# Patient Record
Sex: Female | Born: 1950 | Race: White | Hispanic: No | Marital: Married | Smoking: Never smoker
Health system: Southern US, Community
[De-identification: ages and names within clinical notes are randomized; demographics above are authoritative.]

## PROBLEM LIST (undated history)

## (undated) DIAGNOSIS — Z923 Personal history of irradiation: Secondary | ICD-10-CM

## (undated) DIAGNOSIS — K5792 Diverticulitis of intestine, part unspecified, without perforation or abscess without bleeding: Secondary | ICD-10-CM

## (undated) DIAGNOSIS — C50919 Malignant neoplasm of unspecified site of unspecified female breast: Secondary | ICD-10-CM

## (undated) DIAGNOSIS — K5790 Diverticulosis of intestine, part unspecified, without perforation or abscess without bleeding: Secondary | ICD-10-CM

## (undated) DIAGNOSIS — N841 Polyp of cervix uteri: Secondary | ICD-10-CM

## (undated) DIAGNOSIS — J189 Pneumonia, unspecified organism: Secondary | ICD-10-CM

## (undated) DIAGNOSIS — E559 Vitamin D deficiency, unspecified: Secondary | ICD-10-CM

## (undated) DIAGNOSIS — M1711 Unilateral primary osteoarthritis, right knee: Secondary | ICD-10-CM

## (undated) DIAGNOSIS — R112 Nausea with vomiting, unspecified: Secondary | ICD-10-CM

## (undated) DIAGNOSIS — M549 Dorsalgia, unspecified: Secondary | ICD-10-CM

## (undated) DIAGNOSIS — Z9889 Other specified postprocedural states: Secondary | ICD-10-CM

## (undated) DIAGNOSIS — Z8489 Family history of other specified conditions: Secondary | ICD-10-CM

## (undated) DIAGNOSIS — C801 Malignant (primary) neoplasm, unspecified: Secondary | ICD-10-CM

## (undated) DIAGNOSIS — I10 Essential (primary) hypertension: Secondary | ICD-10-CM

## (undated) HISTORY — PX: COLONOSCOPY: SHX174

## (undated) HISTORY — PX: DILATION AND CURETTAGE OF UTERUS: SHX78

## (undated) HISTORY — PX: BREAST BIOPSY: SHX20

## (undated) HISTORY — DX: Diverticulitis of intestine, part unspecified, without perforation or abscess without bleeding: K57.92

## (undated) HISTORY — DX: Polyp of cervix uteri: N84.1

## (undated) HISTORY — PX: SQUINT REPAIR: SHX5212

## (undated) HISTORY — PX: BREAST EXCISIONAL BIOPSY: SUR124

---
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
## 1989-05-25 HISTORY — PX: EXPLORATORY LAPAROTOMY: SUR591

## 2002-05-25 DIAGNOSIS — C50919 Malignant neoplasm of unspecified site of unspecified female breast: Secondary | ICD-10-CM

## 2002-05-25 HISTORY — DX: Malignant neoplasm of unspecified site of unspecified female breast: C50.919

## 2002-05-25 HISTORY — PX: BREAST LUMPECTOMY: SHX2

## 2004-03-05 ENCOUNTER — Ambulatory Visit: Payer: Self-pay | Admitting: Internal Medicine

## 2004-03-26 ENCOUNTER — Ambulatory Visit: Payer: Self-pay | Admitting: Oncology

## 2004-07-14 ENCOUNTER — Ambulatory Visit: Payer: Self-pay | Admitting: General Surgery

## 2004-09-24 ENCOUNTER — Ambulatory Visit: Payer: Self-pay | Admitting: Oncology

## 2004-10-01 ENCOUNTER — Ambulatory Visit: Payer: Self-pay | Admitting: General Surgery

## 2004-10-23 ENCOUNTER — Ambulatory Visit: Payer: Self-pay | Admitting: Oncology

## 2005-03-25 ENCOUNTER — Ambulatory Visit: Payer: Self-pay | Admitting: Oncology

## 2005-04-24 ENCOUNTER — Ambulatory Visit: Payer: Self-pay | Admitting: Oncology

## 2005-07-07 ENCOUNTER — Ambulatory Visit: Payer: Self-pay | Admitting: Internal Medicine

## 2005-10-05 ENCOUNTER — Ambulatory Visit: Payer: Self-pay | Admitting: Oncology

## 2005-10-09 ENCOUNTER — Ambulatory Visit: Payer: Self-pay | Admitting: Oncology

## 2005-10-23 ENCOUNTER — Ambulatory Visit: Payer: Self-pay | Admitting: Oncology

## 2006-04-07 ENCOUNTER — Ambulatory Visit: Payer: Self-pay | Admitting: Oncology

## 2006-04-24 ENCOUNTER — Ambulatory Visit: Payer: Self-pay | Admitting: Oncology

## 2006-09-23 ENCOUNTER — Ambulatory Visit: Payer: Self-pay | Admitting: Oncology

## 2006-10-07 ENCOUNTER — Ambulatory Visit: Payer: Self-pay | Admitting: Oncology

## 2006-10-12 ENCOUNTER — Ambulatory Visit: Payer: Self-pay | Admitting: Oncology

## 2006-10-24 ENCOUNTER — Ambulatory Visit: Payer: Self-pay | Admitting: Oncology

## 2007-01-03 ENCOUNTER — Ambulatory Visit: Payer: Self-pay | Admitting: Gastroenterology

## 2007-03-26 ENCOUNTER — Ambulatory Visit: Payer: Self-pay | Admitting: Oncology

## 2007-04-14 ENCOUNTER — Ambulatory Visit: Payer: Self-pay | Admitting: Oncology

## 2007-04-25 ENCOUNTER — Ambulatory Visit: Payer: Self-pay | Admitting: Oncology

## 2007-08-24 ENCOUNTER — Ambulatory Visit: Payer: Self-pay | Admitting: Oncology

## 2007-09-23 ENCOUNTER — Ambulatory Visit: Payer: Self-pay | Admitting: Oncology

## 2007-10-11 ENCOUNTER — Ambulatory Visit: Payer: Self-pay | Admitting: Oncology

## 2007-10-18 ENCOUNTER — Ambulatory Visit: Payer: Self-pay | Admitting: Oncology

## 2007-10-24 ENCOUNTER — Ambulatory Visit: Payer: Self-pay | Admitting: Oncology

## 2007-11-01 ENCOUNTER — Ambulatory Visit: Payer: Self-pay | Admitting: Oncology

## 2007-11-23 ENCOUNTER — Ambulatory Visit: Payer: Self-pay | Admitting: Oncology

## 2008-02-08 ENCOUNTER — Emergency Department: Payer: Self-pay | Admitting: Internal Medicine

## 2008-03-25 ENCOUNTER — Ambulatory Visit: Payer: Self-pay | Admitting: Oncology

## 2008-04-17 ENCOUNTER — Ambulatory Visit: Payer: Self-pay | Admitting: Oncology

## 2008-04-24 ENCOUNTER — Ambulatory Visit: Payer: Self-pay | Admitting: Oncology

## 2008-08-14 ENCOUNTER — Ambulatory Visit: Payer: Self-pay

## 2008-08-30 ENCOUNTER — Ambulatory Visit: Payer: Self-pay

## 2008-10-11 ENCOUNTER — Ambulatory Visit: Payer: Self-pay | Admitting: Oncology

## 2008-10-23 ENCOUNTER — Ambulatory Visit: Payer: Self-pay | Admitting: Oncology

## 2008-10-24 ENCOUNTER — Ambulatory Visit: Payer: Self-pay | Admitting: Oncology

## 2008-11-22 ENCOUNTER — Ambulatory Visit: Payer: Self-pay | Admitting: Oncology

## 2008-12-18 ENCOUNTER — Other Ambulatory Visit: Payer: Self-pay | Admitting: Internal Medicine

## 2009-04-03 ENCOUNTER — Other Ambulatory Visit: Payer: Self-pay | Admitting: Internal Medicine

## 2009-09-22 ENCOUNTER — Ambulatory Visit: Payer: Self-pay | Admitting: Oncology

## 2009-10-15 ENCOUNTER — Ambulatory Visit: Payer: Self-pay | Admitting: Oncology

## 2009-10-22 ENCOUNTER — Ambulatory Visit: Payer: Self-pay | Admitting: Oncology

## 2009-10-23 ENCOUNTER — Ambulatory Visit: Payer: Self-pay | Admitting: Oncology

## 2009-12-30 ENCOUNTER — Other Ambulatory Visit: Payer: Self-pay | Admitting: Internal Medicine

## 2010-04-04 ENCOUNTER — Ambulatory Visit: Payer: Self-pay | Admitting: Gastroenterology

## 2010-10-20 ENCOUNTER — Ambulatory Visit: Payer: Self-pay | Admitting: Internal Medicine

## 2011-01-01 ENCOUNTER — Other Ambulatory Visit: Payer: Self-pay | Admitting: Internal Medicine

## 2011-01-06 ENCOUNTER — Ambulatory Visit: Payer: Self-pay | Admitting: Internal Medicine

## 2011-08-25 ENCOUNTER — Ambulatory Visit: Payer: Self-pay | Admitting: Internal Medicine

## 2011-10-22 ENCOUNTER — Ambulatory Visit: Payer: Self-pay | Admitting: Internal Medicine

## 2011-12-31 ENCOUNTER — Other Ambulatory Visit: Payer: Self-pay | Admitting: Internal Medicine

## 2011-12-31 LAB — LIPID PANEL
Cholesterol: 207 mg/dL — ABNORMAL HIGH (ref 0–200)
HDL Cholesterol: 60 mg/dL (ref 40–60)
Ldl Cholesterol, Calc: 130 mg/dL — ABNORMAL HIGH (ref 0–100)
Triglycerides: 87 mg/dL (ref 0–200)
VLDL Cholesterol, Calc: 17 mg/dL (ref 5–40)

## 2011-12-31 LAB — COMPREHENSIVE METABOLIC PANEL
Anion Gap: 6 — ABNORMAL LOW (ref 7–16)
BUN: 20 mg/dL — ABNORMAL HIGH (ref 7–18)
Bilirubin,Total: 0.3 mg/dL (ref 0.2–1.0)
Chloride: 110 mmol/L — ABNORMAL HIGH (ref 98–107)
Co2: 28 mmol/L (ref 21–32)
EGFR (African American): >60
Osmolality: 289 (ref 275–301)
Potassium: 3.8 mmol/L (ref 3.5–5.1)
SGOT(AST): 37 U/L (ref 15–37)
Sodium: 144 mmol/L (ref 136–145)
Total Protein: 6.6 g/dL (ref 6.4–8.2)

## 2011-12-31 LAB — URINALYSIS, COMPLETE
Bacteria: NONE SEEN
Bilirubin,UR: NEGATIVE
Glucose,UR: NEGATIVE mg/dL (ref 0–75)
Ketone: NEGATIVE
RBC,UR: 3 /HPF (ref 0–5)
Specific Gravity: 1.023 (ref 1.003–1.030)
Squamous Epithelial: 3
Transitional Epi: <1
WBC UR: 17 /HPF (ref 0–5)

## 2011-12-31 LAB — CBC WITH DIFFERENTIAL/PLATELET
Basophil %: 1.3 %
Eosinophil %: 3.2 %
HCT: 38.2 % (ref 35.0–47.0)
HGB: 13 g/dL (ref 12.0–16.0)
Lymphocyte #: 1.1 10*3/uL (ref 1.0–3.6)
MCH: 30.7 pg (ref 26.0–34.0)
MCV: 91 fL (ref 80–100)
Monocyte #: 0.4 x10 3/mm (ref 0.2–0.9)
Monocyte %: 11 %
Neutrophil #: 1.9 10*3/uL (ref 1.4–6.5)
Platelet: 156 10*3/uL (ref 150–440)
RBC: 4.22 10*6/uL (ref 3.80–5.20)
WBC: 3.5 10*3/uL — ABNORMAL LOW (ref 3.6–11.0)

## 2011-12-31 LAB — SEDIMENTATION RATE: Erythrocyte Sed Rate: 5 mm/hr (ref 0–30)

## 2012-01-08 ENCOUNTER — Ambulatory Visit: Payer: Self-pay | Admitting: Internal Medicine

## 2012-01-19 ENCOUNTER — Encounter: Payer: Self-pay | Admitting: Physical Medicine and Rehabilitation

## 2012-01-24 ENCOUNTER — Encounter: Payer: Self-pay | Admitting: Physical Medicine and Rehabilitation

## 2012-02-23 ENCOUNTER — Encounter: Payer: Self-pay | Admitting: Physical Medicine and Rehabilitation

## 2012-05-16 ENCOUNTER — Other Ambulatory Visit: Payer: Self-pay | Admitting: Internal Medicine

## 2012-11-07 ENCOUNTER — Ambulatory Visit: Payer: Self-pay | Admitting: Internal Medicine

## 2012-11-08 ENCOUNTER — Ambulatory Visit: Payer: Self-pay | Admitting: Internal Medicine

## 2013-11-13 ENCOUNTER — Ambulatory Visit: Payer: Self-pay | Admitting: Internal Medicine

## 2013-11-23 ENCOUNTER — Ambulatory Visit: Payer: Self-pay | Admitting: Internal Medicine

## 2014-01-08 DIAGNOSIS — E559 Vitamin D deficiency, unspecified: Secondary | ICD-10-CM | POA: Insufficient documentation

## 2014-09-05 ENCOUNTER — Encounter: Payer: Self-pay | Admitting: *Deleted

## 2014-09-26 ENCOUNTER — Other Ambulatory Visit: Payer: Self-pay | Admitting: Internal Medicine

## 2014-09-26 DIAGNOSIS — Z1231 Encounter for screening mammogram for malignant neoplasm of breast: Secondary | ICD-10-CM

## 2014-11-19 ENCOUNTER — Other Ambulatory Visit: Payer: Self-pay | Admitting: Internal Medicine

## 2014-11-19 ENCOUNTER — Ambulatory Visit
Admission: RE | Admit: 2014-11-19 | Discharge: 2014-11-19 | Disposition: A | Discharge status: Home / Self Care | Payer: 59 | Source: Ambulatory Visit | Attending: Internal Medicine | Admitting: Internal Medicine

## 2014-11-19 DIAGNOSIS — Z1231 Encounter for screening mammogram for malignant neoplasm of breast: Secondary | ICD-10-CM | POA: Diagnosis present

## 2014-11-19 HISTORY — DX: Personal history of irradiation: Z92.3

## 2014-11-19 HISTORY — DX: Malignant (primary) neoplasm, unspecified: C80.1

## 2015-10-30 ENCOUNTER — Other Ambulatory Visit: Payer: Self-pay | Admitting: Internal Medicine

## 2015-10-30 DIAGNOSIS — Z1231 Encounter for screening mammogram for malignant neoplasm of breast: Secondary | ICD-10-CM

## 2015-11-20 ENCOUNTER — Ambulatory Visit
Admission: RE | Admit: 2015-11-20 | Discharge: 2015-11-20 | Disposition: A | Discharge status: Home / Self Care | Payer: BC Managed Care – PPO | Source: Ambulatory Visit | Attending: Internal Medicine | Admitting: Internal Medicine

## 2015-11-20 ENCOUNTER — Other Ambulatory Visit: Payer: Self-pay | Admitting: Internal Medicine

## 2015-11-20 DIAGNOSIS — Z1231 Encounter for screening mammogram for malignant neoplasm of breast: Secondary | ICD-10-CM

## 2016-01-26 ENCOUNTER — Encounter: Payer: Self-pay | Admitting: Emergency Medicine

## 2016-01-26 ENCOUNTER — Emergency Department: Payer: BC Managed Care – PPO

## 2016-01-26 ENCOUNTER — Emergency Department
Admission: EM | Admit: 2016-01-26 | Discharge: 2016-01-26 | Disposition: A | Discharge status: Home / Self Care | Payer: BC Managed Care – PPO | Attending: Emergency Medicine | Admitting: Emergency Medicine

## 2016-01-26 DIAGNOSIS — R0789 Other chest pain: Secondary | ICD-10-CM | POA: Insufficient documentation

## 2016-01-26 DIAGNOSIS — Z853 Personal history of malignant neoplasm of breast: Secondary | ICD-10-CM | POA: Diagnosis not present

## 2016-01-26 HISTORY — DX: Dorsalgia, unspecified: M54.9

## 2016-01-26 LAB — BASIC METABOLIC PANEL
Anion gap: 8 (ref 5–15)
BUN: 21 mg/dL — ABNORMAL HIGH (ref 6–20)
CO2: 25 mmol/L (ref 22–32)
CREATININE: 0.56 mg/dL (ref 0.44–1.00)
Calcium: 9.2 mg/dL (ref 8.9–10.3)
Chloride: 107 mmol/L (ref 101–111)
GFR calc non Af Amer: >60 mL/min (ref >60)
Glucose, Bld: 98 mg/dL (ref 65–99)
Potassium: 3.9 mmol/L (ref 3.5–5.1)
SODIUM: 140 mmol/L (ref 135–145)

## 2016-01-26 LAB — CBC
HCT: 41.7 % (ref 35.0–47.0)
Hemoglobin: 14.3 g/dL (ref 12.0–16.0)
MCH: 30.2 pg (ref 26.0–34.0)
MCHC: 34.2 g/dL (ref 32.0–36.0)
MCV: 88.5 fL (ref 80.0–100.0)
PLATELETS: 168 10*3/uL (ref 150–440)
RBC: 4.71 MIL/uL (ref 3.80–5.20)
RDW: 13.8 % (ref 11.5–14.5)
WBC: 9.7 10*3/uL (ref 3.6–11.0)

## 2016-01-26 LAB — BRAIN NATRIURETIC PEPTIDE: B Natriuretic Peptide: 24 pg/mL (ref 0.0–100.0)

## 2016-01-26 LAB — TROPONIN I

## 2016-01-26 LAB — FIBRIN DERIVATIVES D-DIMER (ARMC ONLY): Fibrin derivatives D-dimer (ARMC): 953 — ABNORMAL HIGH (ref 0–499)

## 2016-01-26 MED ORDER — MORPHINE SULFATE (PF) 2 MG/ML IV SOLN
2.0000 mg | Freq: Once | INTRAVENOUS | Status: AC
  Administered 2016-01-26: 2 mg via INTRAVENOUS
  Filled 2016-01-26: qty 1

## 2016-01-26 MED ORDER — ACETAMINOPHEN 500 MG PO TABS
1000.0000 mg | ORAL_TABLET | Freq: Once | ORAL | Status: AC
  Administered 2016-01-26: 1000 mg via ORAL
  Filled 2016-01-26: qty 2

## 2016-01-26 MED ORDER — OXYCODONE HCL 5 MG PO TABS
5.0000 mg | ORAL_TABLET | Freq: Once | ORAL | Status: AC
  Administered 2016-01-26: 5 mg via ORAL
  Filled 2016-01-26: qty 1

## 2016-01-26 MED ORDER — MORPHINE SULFATE (PF) 4 MG/ML IV SOLN
4.0000 mg | Freq: Once | INTRAVENOUS | Status: AC
  Administered 2016-01-26: 4 mg via INTRAVENOUS
  Filled 2016-01-26: qty 1

## 2016-01-26 MED ORDER — OXYCODONE HCL 5 MG PO TABS: 5.0000 mg | ORAL_TABLET | Freq: Three times a day (TID) | ORAL | 0 refills | Status: AC | PRN

## 2016-01-26 MED ORDER — LIDOCAINE 5 % EX PTCH: 1.0000 | MEDICATED_PATCH | Freq: Two times a day (BID) | CUTANEOUS | 0 refills | Status: AC

## 2016-01-26 MED ORDER — LIDOCAINE 5 % EX PTCH
1.0000 | MEDICATED_PATCH | CUTANEOUS | Status: DC
  Administered 2016-01-26: 1 via TRANSDERMAL
  Filled 2016-01-26: qty 1

## 2016-01-26 MED ORDER — IOPAMIDOL (ISOVUE-370) INJECTION 76%
75.0000 mL | Freq: Once | INTRAVENOUS | Status: AC | PRN
  Administered 2016-01-26: 75 mL via INTRAVENOUS

## 2016-01-26 MED ORDER — ONDANSETRON HCL 4 MG/2ML IJ SOLN
4.0000 mg | Freq: Once | INTRAMUSCULAR | Status: AC
  Administered 2016-01-26: 4 mg via INTRAVENOUS
  Filled 2016-01-26: qty 2

## 2016-01-26 NOTE — ED Triage Notes (Signed)
Pt states she woke up this morning around 0500 with right side rib pain. Pt states pain has not improved despite using muscle relaxant and heating pad.  Pt states it is painful to take in a deep breath.  Pt denies any injury or fall or any recent illness.

## 2016-01-26 NOTE — Discharge Instructions (Signed)
Pain control: tylenol 1000mg  every 8 hours, lidoderm patch every 24 hours, oxycodone 5 mg every 4-6 hours as needed for pain not well controlled.

## 2016-01-26 NOTE — ED Provider Notes (Signed)
[facility] Emergency Department Provider Note  ____________________________________________  Time seen: Approximately 9:44 AM  I have reviewed the triage vital signs and the nursing notes.   HISTORY  Chief Complaint Chest Pain   HPI Regina Blanchard is a 65 y.o. female history of remote breast cancer in 2004 status post lumpectomy and radiation and chronic back pain who presents for evaluation of right chest wall pain. Patient reports that she woke up this morning with severe pleuritic right chest wall pain. She denies trauma to the chest wall. She reports that he feels like a very bad muscle pull. She reports that applying pressure over the site of the pain makes it better. Pain is severe when she is taking deep breaths. She denies recent illness, cough, nausea, vomiting, abdominal pain. She does have a family history of DVTs on her mother the patient denies personal history of blood clots. No recent travel or immobilization, no hormones, no leg pain or swelling, no hemoptysis. Patient denies personal or family history of ischemic heart disease. She is not a smoker. She reports she took a muscle relaxant this morning and applied some heat over the area of her pain with no improvement. Of note, last mammogram was in 11/20/2015 and was negative.  Past Medical History:  Diagnosis Date  . Back pain   . Cancer ([place]) right   positive 2004  . Status post radiation therapy    2004 breast cancer    There are no active problems to display for this patient.   Past Surgical History:  Procedure Laterality Date  . BREAST BIOPSY Left    1991 negative 2006 negative  . BREAST EXCISIONAL BIOPSY Right    positive 2004     Prior to Admission medications   Medication Sig Start Date End Date Taking? Authorizing Provider  lidocaine (LIDODERM) 5 % Place 1 patch onto the skin every 12 (twelve) hours. Remove & Discard patch within 12 hours or as directed by MD 01/26/16 01/25/17   Rudene Re, MD  oxyCODONE (ROXICODONE) 5 MG immediate release tablet Take 1 tablet (5 mg total) by mouth every 8 (eight) hours as needed. 01/26/16 01/25/17  Rudene Re, MD    Allergies Review of patient's allergies indicates no known allergies.  Family History  Problem Relation Age of Onset  . Breast cancer Maternal Aunt 50    Social History Social History  Substance Use Topics  . Smoking status: Never Smoker  . Smokeless tobacco: Never Used  . Alcohol use Not on file    Review of Systems  Constitutional: Negative for fever. Eyes: Negative for visual changes. ENT: Negative for sore throat. Cardiovascular: + R chest wall pain Respiratory: Negative for shortness of breath. Gastrointestinal: Negative for abdominal pain, vomiting or diarrhea. Genitourinary: Negative for dysuria. Musculoskeletal: Negative for back pain. Skin: Negative for rash. Neurological: Negative for headaches, weakness or numbness.  ____________________________________________   PHYSICAL EXAM:  VITAL SIGNS: ED Triage Vitals [01/26/16 0928]  Enc Vitals Group     BP 134/83     Pulse Rate (!) 110     Resp 18     Temp 97.8 F (36.6 C)     Temp Source Oral     SpO2 96 %     Weight 209 lb (94.8 kg)     Height 5\' 9"  (1.753 m)     Head Circumference      Peak Flow      Pain Score 9  Pain Loc      Pain Edu?      Excl. in [place]?     Constitutional: Alert and oriented, splinting and in obvious distress due to pain.  HEENT:      Head: Normocephalic and atraumatic.         Eyes: Conjunctivae are normal. Sclera is non-icteric. EOMI. PERRL      Mouth/Throat: Mucous membranes are moist.       Neck: Supple with no signs of meningismus. Cardiovascular: Tachycardic with regular rhythm. No murmurs, gallops, or rubs. 2+ symmetrical distal pulses are present in all extremities. No JVD. Respiratory: Normal respiratory effort. Lungs are clear to auscultation bilaterally. No wheezes, crackles, or  rhonchi.  Gastrointestinal: Soft, non tender, and non distended with positive bowel sounds. No rebound or guarding. Genitourinary: No CVA tenderness. Musculoskeletal: Nontender with normal range of motion in all extremities. No edema, cyanosis, or erythema of extremities. Neurologic: Normal speech and language. Face is symmetric. Moving all extremities. No gross focal neurologic deficits are appreciated. Skin: Skin is warm, dry and intact. Erythematous macules over the R breast and chest wall Psychiatric: Mood and affect are normal. Speech and behavior are normal.  ____________________________________________   LABS (all labs ordered are listed, but only abnormal results are displayed)  Labs Reviewed  BASIC METABOLIC PANEL - Abnormal; Notable for the following:       Result Value   BUN 21 (*)    All other components within normal limits  FIBRIN DERIVATIVES D-DIMER ([facility] ONLY) - Abnormal; Notable for the following:    Fibrin derivatives D-dimer ([facility]) 953 (*)    All other components within normal limits  TROPONIN I  CBC  BRAIN NATRIURETIC PEPTIDE   ____________________________________________  EKG  ED ECG REPORT I, Rudene Re, the attending physician, personally viewed and interpreted this ECG.  Sinus tachycardia, rate of 103, normal intervals, normal axis, no ST elevations or depression,  T-wave inversion on V3. No prior for comparison  ____________________________________________  RADIOLOGY  CTA: negativ ____________________________________________   PROCEDURES  Procedure(s) performed: None Procedures Critical Care performed:  None ____________________________________________   INITIAL IMPRESSION / ASSESSMENT AND PLAN / ED COURSE  65 y.o. female history of remote breast cancer in 2004 status post lumpectomy and radiation and chronic back pain who presents for evaluation of acute sudden onset of pleuritic right chest wall pain since this morning. No trauma.  Patient is a splinting and in obvious distress, her vital signs show tachycardia with heart rate of 110 but otherwise within normal limits, lungs are clear to auscultation, pain is nonreproducible on palpation. Patient does have a macular erythematous rash underlying her right breast and chest wall which she attributes to the heat pad that she had on that side. No vesicles or lesions concerning for shingles. Patient's last mammogram was 3 months ago for recurrent disease. Differential diagnoses including pleurisy versus pneumonia versus PE versus MSK. EKG with no evidence of ischemia. We'll treat her pain with IV morphine. We'll check CBC, BMP, d-dimer, troponin. We'll get a chest x-ray.  Clinical Course  Comment By Time  D-dimer elevated. CTA PE ordered. Patient and family updated. Troponin and BMP WNL. Patient continues to have significant pain. CXR showing small pleural effusion on the R side.  Rudene Re, MD 09/03 1130  CT negative for PE or PNA or fracture. Pain very positional. Rash resolved. Probably MSK. Patient reports pain 6/10, worse with inspiration. Patient splinting. Will switch to PO meds with tylenol 1000mg , oxycodone 5mg , lidoderm  patch. Will teach patient how to use incentive spirometer. Rudene Re, MD 09/03 1322    Pertinent labs & imaging results that were available during my care of the patient were reviewed by me and considered in my medical decision making (see chart for details).    ____________________________________________   FINAL CLINICAL IMPRESSION(S) / ED DIAGNOSES  Final diagnoses:  Chest wall pain      NEW MEDICATIONS STARTED DURING THIS VISIT:  New Prescriptions   LIDOCAINE (LIDODERM) 5 %    Place 1 patch onto the skin every 12 (twelve) hours. Remove & Discard patch within 12 hours or as directed by MD   OXYCODONE (ROXICODONE) 5 MG IMMEDIATE RELEASE TABLET    Take 1 tablet (5 mg total) by mouth every 8 (eight) hours as needed.     Note:   This document was prepared using Dragon voice recognition software and may include unintentional dictation errors.    Rudene Re, MD 01/26/16 [phone]

## 2016-10-23 ENCOUNTER — Other Ambulatory Visit: Payer: Self-pay | Admitting: Internal Medicine

## 2016-10-23 DIAGNOSIS — Z1231 Encounter for screening mammogram for malignant neoplasm of breast: Secondary | ICD-10-CM

## 2016-12-01 ENCOUNTER — Ambulatory Visit
Admission: RE | Admit: 2016-12-01 | Discharge: 2016-12-01 | Disposition: A | Discharge status: Home / Self Care | Payer: BC Managed Care – PPO | Source: Ambulatory Visit | Attending: Internal Medicine | Admitting: Internal Medicine

## 2016-12-01 DIAGNOSIS — Z1231 Encounter for screening mammogram for malignant neoplasm of breast: Secondary | ICD-10-CM | POA: Insufficient documentation

## 2016-12-01 HISTORY — DX: Malignant neoplasm of unspecified site of unspecified female breast: C50.919

## 2016-12-03 ENCOUNTER — Other Ambulatory Visit: Payer: Self-pay | Admitting: Internal Medicine

## 2016-12-03 DIAGNOSIS — N6320 Unspecified lump in the left breast, unspecified quadrant: Secondary | ICD-10-CM

## 2016-12-03 DIAGNOSIS — R928 Other abnormal and inconclusive findings on diagnostic imaging of breast: Secondary | ICD-10-CM

## 2016-12-11 ENCOUNTER — Ambulatory Visit
Admission: RE | Admit: 2016-12-11 | Discharge: 2016-12-11 | Disposition: A | Discharge status: Home / Self Care | Payer: BC Managed Care – PPO | Source: Ambulatory Visit | Attending: Internal Medicine | Admitting: Internal Medicine

## 2016-12-11 DIAGNOSIS — N6320 Unspecified lump in the left breast, unspecified quadrant: Secondary | ICD-10-CM

## 2016-12-11 DIAGNOSIS — R928 Other abnormal and inconclusive findings on diagnostic imaging of breast: Secondary | ICD-10-CM

## 2017-01-27 ENCOUNTER — Ambulatory Visit (INDEPENDENT_AMBULATORY_CARE_PROVIDER_SITE_OTHER): Payer: BC Managed Care – PPO | Admitting: Advanced Practice Midwife

## 2017-01-27 ENCOUNTER — Encounter: Payer: Self-pay | Admitting: Advanced Practice Midwife

## 2017-01-27 VITALS — BP 138/82 | HR 78 | Ht 68.5 in | Wt 217.0 lb

## 2017-01-27 DIAGNOSIS — N6019 Diffuse cystic mastopathy of unspecified breast: Secondary | ICD-10-CM | POA: Insufficient documentation

## 2017-01-27 DIAGNOSIS — Z87440 Personal history of urinary (tract) infections: Secondary | ICD-10-CM

## 2017-01-27 DIAGNOSIS — Z124 Encounter for screening for malignant neoplasm of cervix: Secondary | ICD-10-CM | POA: Diagnosis not present

## 2017-01-27 DIAGNOSIS — Z01419 Encounter for gynecological examination (general) (routine) without abnormal findings: Secondary | ICD-10-CM | POA: Diagnosis not present

## 2017-01-27 DIAGNOSIS — D0591 Unspecified type of carcinoma in situ of right breast: Secondary | ICD-10-CM | POA: Insufficient documentation

## 2017-01-27 MED ORDER — NITROFURANTOIN MONOHYD MACRO 100 MG PO CAPS: ORAL_CAPSULE | ORAL | 6 refills | Status: DC

## 2017-01-28 NOTE — Progress Notes (Signed)
Patient ID: Regina Blanchard, female   DOB: April 28, 1951, 66 y.o.   MRN: [id number]     Gynecology Annual Exam  PCP: Rusty Aus, MD  Chief Complaint:  Chief Complaint  Patient presents with  . Gynecologic Exam    History of Present Illness:Patient is a 66 y.o. G0P0000 presents for annual exam. The patient has no complaints today. She does request a refill of Macrobid which she has had in the past for recurrent UTI following intercourse. She denies current s/s.   LMP: No LMP recorded. Patient is postmenopausal. Postcoital Bleeding: no Dysmenorrhea: not applicable   The patient is sexually active. She denies dyspareunia.  The patient does perform self breast exams.  There is no notable family history of breast or ovarian cancer in her family.  The patient wears seatbelts: yes.   The patient has regular exercise: yes.  She does water aerobics. She admits healthy diet and adequate hydration.  The patient denies current symptoms of depression.     Review of Systems: Review of Systems  Constitutional: Negative.   HENT: Negative.   Eyes: Negative.   Respiratory: Negative.   Cardiovascular: Negative.   Gastrointestinal: Negative.   Genitourinary: Negative.   Musculoskeletal: Negative.   Skin: Negative.   Neurological: Negative.   Endo/Heme/Allergies: Negative.   Psychiatric/Behavioral: Negative.     Past Medical History:  Past Medical History:  Diagnosis Date  . Back pain   . Breast cancer ([place])    right breast cancer  . Cancer ([place]) right   positive 2004  . Status post radiation therapy    2004 breast cancer    Past Surgical History:  Past Surgical History:  Procedure Laterality Date  . BREAST BIOPSY Left    1991 negative 2006 negative  . BREAST EXCISIONAL BIOPSY Right    positive 2004   . BREAST LUMPECTOMY      Gynecologic History:  No LMP recorded. Patient is postmenopausal. Last Pap: 2 years ago Results were:  no abnormalities  Last mammogram: in July  of 2018 Results were: BI-RAD II benign cysts Obstetric History: G0P0000  Family History:  Family History  Problem Relation Age of Onset  . Breast cancer Maternal Aunt 50    Social History:  Social History   Social History  . Marital status: Married    Spouse name: N/A  . Number of children: N/A  . Years of education: N/A   Occupational History  . Not on file.   Social History Main Topics  . Smoking status: Never Smoker  . Smokeless tobacco: Never Used  . Alcohol use No  . Drug use: No  . Sexual activity: Yes    Birth control/ protection: Post-menopausal   Other Topics Concern  . Not on file   Social History Narrative  . No narrative on file    Allergies:  No Known Allergies  Medications: Prior to Admission medications   Medication Sig Start Date End Date Taking? Authorizing Provider  ALPRAZolam (XANAX) 0.25 MG tablet TAKE 1 TABLET BY MOUTH DAILY AS NEEDED FOR SLEEP 04/03/16  Yes [provider]  aspirin EC 81 MG tablet Take by mouth.   Yes [provider]  baclofen (LIORESAL) 10 MG tablet Take by mouth. 01/20/17  Yes [provider]  Cholecalciferol (VITAMIN D3) 2000 units capsule Take by mouth.   Yes [provider]  hydrochlorothiazide (HYDRODIURIL) 25 MG tablet Take by mouth. 01/20/17  Yes [provider]  methocarbamol (ROBAXIN) 750 MG tablet  Take by mouth. 01/20/17  Yes [provider]  nitrofurantoin, macrocrystal-monohydrate, (MACROBID) 100 MG capsule Take one capsule after intercourse to prevent UTI 01/27/17   Rod Can, CNM    Physical Exam Vitals: Blood pressure 138/82, pulse 78, height 5' 8.5" (1.74 m), weight 217 lb (98.4 kg).  General: NAD HEENT: normocephalic, anicteric Thyroid: no enlargement, no palpable nodules Pulmonary: No increased work of breathing, CTAB Cardiovascular: RRR, distal pulses 2+ Breast: Breast symmetrical, no tenderness, no palpable nodules or masses, no skin or nipple  retraction present, no nipple discharge.  No axillary or supraclavicular lymphadenopathy. Abdomen: NABS, soft, non-tender, non-distended.  Umbilicus without lesions.  No hepatomegaly, splenomegaly or masses palpable. No evidence of hernia  Genitourinary:  External: Normal external female genitalia.  Normal urethral meatus, normal  Bartholin's and Skene's glands.    Vagina: Normal vaginal mucosa, no evidence of prolapse.    Cervix: Grossly normal in appearance, no bleeding, no CMT  Uterus: Non-enlarged, mobile, normal contour.    Adnexa: ovaries non-enlarged, no adnexal masses  Rectal: deferred  Lymphatic: no evidence of inguinal lymphadenopathy Extremities: no edema, erythema, or tenderness Neurologic: Grossly intact Psychiatric: mood appropriate, affect full  Female chaperone present for pelvic and breast  portions of the physical exam     Assessment: 66 y.o. G0P0000 routine annual exam  Plan: Problem List Items Addressed This Visit    None    Visit Diagnoses    Well woman exam with routine gynecological exam    -  Primary   Relevant Orders   IGP, Aptima HPV   Cervical cancer screening       Relevant Orders   IGP, Aptima HPV   History of recurrent UTIs       Relevant Medications   nitrofurantoin, macrocrystal-monohydrate, (MACROBID) 100 MG capsule      1) Mammogram - recommend yearly screening mammogram.  Mammogram Is up to date  2) STI screening was offered and declined  3) ASCCP guidelines and rational discussed.  Patient opts for every 3 years screening interval  4) Osteoporosis  - per USPTF routine screening DEXA at age 64 Patient has normal findings except for osteopenia in spine at -1.8. She is due for screening  Consider FDA-approved medical therapies in postmenopausal women and men aged 64 years and older, based on the following: a) A hip or vertebral (clinical or morphometric) fracture b) T-score ? -2.5 at the femoral neck or spine after appropriate  evaluation to exclude secondary causes C) Low bone mass (T-score between -1.0 and -2.5 at the femoral neck or spine) and a 10-year probability of a hip fracture ? 3% or a 10-year probability of a major osteoporosis-related fracture ? 20% based on the [place]-adapted WHO algorithm   5) Routine healthcare maintenance including cholesterol, diabetes screening discussed managed by PCP  6) Colonoscopy: PCP to order.  Screening recommended starting at age 59 for average risk individuals, age 62 for individuals deemed at increased risk (including African Americans) and recommended to continue until age 17.  For patient age 7-85 individualized approach is recommended.  Gold standard screening is via colonoscopy, Cologuard screening is an acceptable alternative for patient unwilling or unable to undergo colonoscopy.  "Colorectal cancer screening for average?risk adults: 2018 guideline update from the American Cancer Society"[place]: A Cancer Journal for Clinicians: Oct 21, 2016   7) Follow up 1 year for routine annual

## 2017-01-30 LAB — IGP, APTIMA HPV
HPV APTIMA: NEGATIVE
PAP SMEAR COMMENT: 0

## 2017-11-17 ENCOUNTER — Other Ambulatory Visit: Payer: Self-pay | Admitting: Internal Medicine

## 2017-11-17 DIAGNOSIS — Z1231 Encounter for screening mammogram for malignant neoplasm of breast: Secondary | ICD-10-CM

## 2017-12-14 ENCOUNTER — Ambulatory Visit
Admission: RE | Admit: 2017-12-14 | Discharge: 2017-12-14 | Disposition: A | Discharge status: Home / Self Care | Payer: BC Managed Care – PPO | Source: Ambulatory Visit | Attending: Internal Medicine | Admitting: Internal Medicine

## 2017-12-14 DIAGNOSIS — Z1231 Encounter for screening mammogram for malignant neoplasm of breast: Secondary | ICD-10-CM | POA: Insufficient documentation

## 2018-01-21 DIAGNOSIS — E782 Mixed hyperlipidemia: Secondary | ICD-10-CM | POA: Insufficient documentation

## 2018-04-10 DIAGNOSIS — M1711 Unilateral primary osteoarthritis, right knee: Secondary | ICD-10-CM | POA: Insufficient documentation

## 2018-04-27 ENCOUNTER — Ambulatory Visit (INDEPENDENT_AMBULATORY_CARE_PROVIDER_SITE_OTHER): Payer: Medicare Other | Admitting: Advanced Practice Midwife

## 2018-04-27 ENCOUNTER — Encounter: Payer: Self-pay | Admitting: Advanced Practice Midwife

## 2018-04-27 VITALS — BP 132/82 | Ht 68.0 in | Wt 216.0 lb

## 2018-04-27 DIAGNOSIS — Z01419 Encounter for gynecological examination (general) (routine) without abnormal findings: Secondary | ICD-10-CM | POA: Diagnosis not present

## 2018-04-27 DIAGNOSIS — Z13820 Encounter for screening for osteoporosis: Secondary | ICD-10-CM

## 2018-04-27 DIAGNOSIS — Z0000 Encounter for general adult medical examination without abnormal findings: Secondary | ICD-10-CM

## 2018-04-27 DIAGNOSIS — Z87440 Personal history of urinary (tract) infections: Secondary | ICD-10-CM

## 2018-04-27 MED ORDER — NITROFURANTOIN MONOHYD MACRO 100 MG PO CAPS: ORAL_CAPSULE | ORAL | 6 refills | Status: DC

## 2018-04-27 NOTE — Patient Instructions (Signed)
Mediterranean Diet A Mediterranean diet refers to food and lifestyle choices that are based on the traditions of countries located on the [place]. This way of eating has been shown to help prevent certain conditions and improve outcomes for people who have chronic diseases, like kidney disease and heart disease. What are tips for following this plan? Lifestyle  Cook and eat meals together with your family, when possible.  Drink enough fluid to keep your urine clear or pale yellow.  Be physically active every day. This includes: ? Aerobic exercise like running or swimming. ? Leisure activities like gardening, walking, or housework.  Get 7-8 hours of sleep each night.  If recommended by your health care provider, drink red wine in moderation. This means 1 glass a day for nonpregnant women and 2 glasses a day for men. A glass of wine equals 5 oz (150 mL). Reading food labels  Check the serving size of packaged foods. For foods such as rice and pasta, the serving size refers to the amount of cooked product, not dry.  Check the total fat in packaged foods. Avoid foods that have saturated fat or trans fats.  Check the ingredients list for added sugars, such as corn syrup. Shopping  At the grocery store, buy most of your food from the areas near the walls of the store. This includes: ? Fresh fruits and vegetables (produce). ? Grains, beans, nuts, and seeds. Some of these may be available in unpackaged forms or large amounts (in bulk). ? Fresh seafood. ? Poultry and eggs. ? Low-fat dairy products.  Buy whole ingredients instead of prepackaged foods.  Buy fresh fruits and vegetables in-season from local farmers markets.  Buy frozen fruits and vegetables in resealable bags.  If you do not have access to quality fresh seafood, buy precooked frozen shrimp or canned fish, such as tuna, salmon, or sardines.  Buy small amounts of raw or cooked vegetables, salads, or olives from the  deli or salad bar at your store.  Stock your pantry so you always have certain foods on hand, such as olive oil, canned tuna, canned tomatoes, rice, pasta, and beans. Cooking  Cook foods with extra-virgin olive oil instead of using butter or other vegetable oils.  Have meat as a side dish, and have vegetables or grains as your main dish. This means having meat in small portions or adding small amounts of meat to foods like pasta or stew.  Use beans or vegetables instead of meat in common dishes like chili or lasagna.  Experiment with different cooking methods. Try roasting or broiling vegetables instead of steaming or sauteing them.  Add frozen vegetables to soups, stews, pasta, or rice.  Add nuts or seeds for added healthy fat at each meal. You can add these to yogurt, salads, or vegetable dishes.  Marinate fish or vegetables using olive oil, lemon juice, garlic, and fresh herbs. Meal planning  Plan to eat 1 vegetarian meal one day each week. Try to work up to 2 vegetarian meals, if possible.  Eat seafood 2 or more times a week.  Have healthy snacks readily available, such as: ? Vegetable sticks with hummus. ? Greek yogurt. ? Fruit and nut trail mix.  Eat balanced meals throughout the week. This includes: ? Fruit: 2-3 servings a day ? Vegetables: 4-5 servings a day ? Low-fat dairy: 2 servings a day ? Fish, poultry, or lean meat: 1 serving a day ? Beans and legumes: 2 or more servings a week ? Nuts   and seeds: 1-2 servings a day ? Whole grains: 6-8 servings a day ? Extra-virgin olive oil: 3-4 servings a day  Limit red meat and sweets to only a few servings a month What are my food choices?  Mediterranean diet ? Recommended ? Grains: Whole-grain pasta. Brown rice. Bulgar wheat. Polenta. Couscous. Whole-wheat bread. Oatmeal. Quinoa. ? Vegetables: Artichokes. Beets. Broccoli. Cabbage. Carrots. Eggplant. Green beans. Chard. Kale. Spinach. Onions. Leeks. Peas. Squash.  Tomatoes. Peppers. Radishes. ? Fruits: Apples. Apricots. Avocado. Berries. Bananas. Cherries. Dates. Figs. Grapes. Lemons. Melon. Oranges. Peaches. Plums. Pomegranate. ? Meats and other protein foods: Beans. Almonds. Sunflower seeds. Pine nuts. Peanuts. Cod. Salmon. Scallops. Shrimp. Tuna. Tilapia. Clams. Oysters. Eggs. ? Dairy: Low-fat milk. Cheese. Greek yogurt. ? Beverages: Water. Red wine. Herbal tea. ? Fats and oils: Extra virgin olive oil. Avocado oil. Grape seed oil. ? Sweets and desserts: Greek yogurt with honey. Baked apples. Poached pears. Trail mix. ? Seasoning and other foods: Basil. Cilantro. Coriander. Cumin. Mint. Parsley. Sage. Rosemary. Tarragon. Garlic. Oregano. Thyme. Pepper. Balsalmic vinegar. Tahini. Hummus. Tomato sauce. Olives. Mushrooms. ? Limit these ? Grains: Prepackaged pasta or rice dishes. Prepackaged cereal with added sugar. ? Vegetables: Deep fried potatoes (french fries). ? Fruits: Fruit canned in syrup. ? Meats and other protein foods: Beef. Pork. Lamb. Poultry with skin. Hot dogs. Bacon. ? Dairy: Ice cream. Sour cream. Whole milk. ? Beverages: Juice. Sugar-sweetened soft drinks. Beer. Liquor and spirits. ? Fats and oils: Butter. Canola oil. Vegetable oil. Beef fat (tallow). Lard. ? Sweets and desserts: Cookies. Cakes. Pies. Candy. ? Seasoning and other foods: Mayonnaise. Premade sauces and marinades. ? The items listed may not be a complete list. Talk with your dietitian about what dietary choices are right for you. Summary  The Mediterranean diet includes both food and lifestyle choices.  Eat a variety of fresh fruits and vegetables, beans, nuts, seeds, and whole grains.  Limit the amount of red meat and sweets that you eat.  Talk with your health care provider about whether it is safe for you to drink red wine in moderation. This means 1 glass a day for nonpregnant women and 2 glasses a day for men. A glass of wine equals 5 oz (150 mL). This information  is not intended to replace advice given to you by your health care provider. Make sure you discuss any questions you have with your health care provider. Document Released: 01/02/2016 Document Revised: 02/04/2016 Document Reviewed: 01/02/2016 Elsevier Interactive Patient Education  2018 Elsevier Inc. American Heart Association (AHA) Exercise Recommendation  Being physically active is important to prevent heart disease and stroke, the nation's No. 1and No. 5killers. To improve overall cardiovascular health, we suggest at least 150 minutes per week of moderate exercise or 75 minutes per week of vigorous exercise (or a combination of moderate and vigorous activity). Thirty minutes a day, five times a week is an easy goal to remember. You will also experience benefits even if you divide your time into two or three segments of 10 to 15 minutes per day.  For people who would benefit from lowering their blood pressure or cholesterol, we recommend 40 minutes of aerobic exercise of moderate to vigorous intensity three to four times a week to lower the risk for heart attack and stroke.  Physical activity is anything that makes you move your body and burn calories.  This includes things like climbing stairs or playing sports. Aerobic exercises benefit your heart, and include walking, jogging, swimming or biking. Strength and   stretching exercises are best for overall stamina and flexibility.  The simplest, positive change you can make to effectively improve your heart health is to start walking. It's enjoyable, free, easy, social and great exercise. A walking program is flexible and boasts high success rates because people can stick with it. It's easy for walking to become a regular and satisfying part of life.   For Overall Cardiovascular Health:  At least 30 minutes of moderate-intensity aerobic activity at least 5 days per week for a total of 150  OR   At least 25 minutes of vigorous aerobic activity  at least 3 days per week for a total of 75 minutes; or a combination of moderate- and vigorous-intensity aerobic activity  AND   Moderate- to high-intensity muscle-strengthening activity at least 2 days per week for additional health benefits.  For Lowering Blood Pressure and Cholesterol  An average 40 minutes of moderate- to vigorous-intensity aerobic activity 3 or 4 times per week  What if I can't make it to the time goal? Something is always better than nothing! And everyone has to start somewhere. Even if you've been sedentary for years, today is the day you can begin to make healthy changes in your life. If you don't think you'll make it for 30 or 40 minutes, set a reachable goal for today. You can work up toward your overall goal by increasing your time as you get stronger. Don't let all-or-nothing thinking rob you of doing what you can every day.  Source:http://www.heart.org   Preventive Care 40-64 Years, Female Preventive care refers to lifestyle choices and visits with your health care provider that can promote health and wellness. What does preventive care include?  A yearly physical exam. This is also called an annual well check.  Dental exams once or twice a year.  Routine eye exams. Ask your health care provider how often you should have your eyes checked.  Personal lifestyle choices, including: ? Daily care of your teeth and gums. ? Regular physical activity. ? Eating a healthy diet. ? Avoiding tobacco and drug use. ? Limiting alcohol use. ? Practicing safe sex. ? Taking low-dose aspirin daily starting at age 34. ? Taking vitamin and mineral supplements as recommended by your health care provider. What happens during an annual well check? The services and screenings done by your health care provider during your annual well check will depend on your age, overall health, lifestyle risk factors, and family history of disease. Counseling Your health care provider may  ask you questions about your:  Alcohol use.  Tobacco use.  Drug use.  Emotional well-being.  Home and relationship well-being.  Sexual activity.  Eating habits.  Work and work Statistician.  Method of birth control.  Menstrual cycle.  Pregnancy history.  Screening You may have the following tests or measurements:  Height, weight, and BMI.  Blood pressure.  Lipid and cholesterol levels. These may be checked every 5 years, or more frequently if you are over 23 years old.  Skin check.  Lung cancer screening. You may have this screening every year starting at age 59 if you have a 30-pack-year history of smoking and currently smoke or have quit within the past 15 years.  Fecal occult blood test (FOBT) of the stool. You may have this test every year starting at age 66.  Flexible sigmoidoscopy or colonoscopy. You may have a sigmoidoscopy every 5 years or a colonoscopy every 10 years starting at age 66.  Hepatitis C blood test.  Hepatitis B blood test.  Sexually transmitted disease (STD) testing.  Diabetes screening. This is done by checking your blood sugar (glucose) after you have not eaten for a while (fasting). You may have this done every 1-3 years.  Mammogram. This may be done every 1-2 years. Talk to your health care provider about when you should start having regular mammograms. This may depend on whether you have a family history of breast cancer.  BRCA-related cancer screening. This may be done if you have a family history of breast, ovarian, tubal, or peritoneal cancers.  Pelvic exam and Pap test. This may be done every 3 years starting at age 58. Starting at age 75, this may be done every 5 years if you have a Pap test in combination with an HPV test.  Bone density scan. This is done to screen for osteoporosis. You may have this scan if you are at high risk for osteoporosis.  Discuss your test results, treatment options, and if necessary, the need for more  tests with your health care provider. Vaccines Your health care provider may recommend certain vaccines, such as:  Influenza vaccine. This is recommended every year.  Tetanus, diphtheria, and acellular pertussis (Tdap, Td) vaccine. You may need a Td booster every 10 years.  Varicella vaccine. You may need this if you have not been vaccinated.  Zoster vaccine. You may need this after age 57.  Measles, mumps, and rubella (MMR) vaccine. You may need at least one dose of MMR if you were born in 1957 or later. You may also need a second dose.  Pneumococcal 13-valent conjugate (PCV13) vaccine. You may need this if you have certain conditions and were not previously vaccinated.  Pneumococcal polysaccharide (PPSV23) vaccine. You may need one or two doses if you smoke cigarettes or if you have certain conditions.  Meningococcal vaccine. You may need this if you have certain conditions.  Hepatitis A vaccine. You may need this if you have certain conditions or if you travel or work in places where you may be exposed to hepatitis A.  Hepatitis B vaccine. You may need this if you have certain conditions or if you travel or work in places where you may be exposed to hepatitis B.  Haemophilus influenzae type b (Hib) vaccine. You may need this if you have certain conditions.  Talk to your health care provider about which screenings and vaccines you need and how often you need them. This information is not intended to replace advice given to you by your health care provider. Make sure you discuss any questions you have with your health care provider. Document Released: 06/07/2015 Document Revised: 01/29/2016 Document Reviewed: 03/12/2015 Elsevier Interactive Patient Education  [employer].

## 2018-04-27 NOTE — Progress Notes (Signed)
Patient ID: Regina Blanchard, female   DOB: [birth year], 67 y.o.   MRN: [id number]      Gynecology Annual Exam  PCP: Rusty Aus, MD  Chief Complaint:  Chief Complaint  Patient presents with  . Gynecologic Exam    History of Present Illness:Patient is a 67 y.o. G0P0000 presents for annual exam. The patient has no gyn complaints today. She was taking HCTZ PRN for some swelling in her leg but is now taking it daily for her blood pressure prior to having a knee replacement in February next year. She has a new diagnosis of osteoarthritis in her right knee. She has had some ongoing difficulties with urine leakage but especially since taking the HCTZ regularly. She admits adequate hydration and she occasionally does Kegel exercises.   LMP: No LMP recorded. Patient is postmenopausal.  Postcoital Bleeding: no Dysmenorrhea: not applicable  The patient is sexually active. She denies dyspareunia.  The patient does perform self breast exams.  There is no notable family history of breast or ovarian cancer in her family. She was diagnosed and treated for breast cancer at age 37.  The patient wears seatbelts: yes.   The patient has regular exercise: she does water aerobics 2 x per week. She admits to generally healthy diet with room for improvement. .    The patient denies current symptoms of depression.     Review of Systems: Review of Systems  Constitutional: Negative.   HENT: Negative.   Eyes: Negative.   Respiratory: Negative.   Cardiovascular: Negative.   Gastrointestinal: Negative.   Genitourinary: Positive for urgency.  Musculoskeletal: Positive for joint pain.  Skin: Negative.   Neurological: Negative.   Endo/Heme/Allergies: Negative.   Psychiatric/Behavioral: Negative.     Past Medical History:  Past Medical History:  Diagnosis Date  . Back pain   . Breast cancer ([place])    right breast cancer  . Cancer ([place]) right   positive 2004  . Cervical polyp   . Diverticulitis   .  Status post radiation therapy    2004 breast cancer    Past Surgical History:  Past Surgical History:  Procedure Laterality Date  . BREAST BIOPSY Left    1991 negative 2006 negative  . BREAST EXCISIONAL BIOPSY Right    positive 2004   . BREAST LUMPECTOMY    . DILATION AND CURETTAGE OF UTERUS      Gynecologic History:  No LMP recorded. Patient is postmenopausal. Last Pap: 1 year ago Results were:  no abnormalities  Last mammogram: 5 months ago Results were: BI-RAD I  Obstetric History: G0P0000  Family History:  Family History  Problem Relation Age of Onset  . Breast cancer Maternal Aunt 66  . Diabetes Mother   . Heart disease Father   . Diabetes Sister   . Breast cancer Paternal Aunt   . Colon cancer Maternal Aunt   . Hypertension Maternal Aunt     Social History:  Social History   Socioeconomic History  . Marital status: Married    Spouse name: Not on file  . Number of children: Not on file  . Years of education: Not on file  . Highest education level: Not on file  Occupational History  . Not on file  Social Needs  . Financial resource strain: Not on file  . Food insecurity:    Worry: Not on file    Inability: Not on file  . Transportation needs:    Medical: Not on file  Non-medical: Not on file  Tobacco Use  . Smoking status: Never Smoker  . Smokeless tobacco: Never Used  Substance and Sexual Activity  . Alcohol use: No  . Drug use: No  . Sexual activity: Yes    Birth control/protection: Post-menopausal  Lifestyle  . Physical activity:    Days per week: Not on file    Minutes per session: Not on file  . Stress: Not on file  Relationships  . Social connections:    Talks on phone: Not on file    Gets together: Not on file    Attends religious service: Not on file    Active member of club or organization: Not on file    Attends meetings of clubs or organizations: Not on file    Relationship status: Not on file  . Intimate partner violence:     Fear of current or ex partner: Not on file    Emotionally abused: Not on file    Physically abused: Not on file    Forced sexual activity: Not on file  Other Topics Concern  . Not on file  Social History Narrative  . Not on file    Allergies:  No Known Allergies  Medications: Prior to Admission medications   Medication Sig Start Date End Date Taking? Authorizing Provider  ALPRAZolam (XANAX) 0.25 MG tablet TAKE 1 TABLET BY MOUTH DAILY AS NEEDED FOR SLEEP 04/03/16  Yes [provider]  aspirin EC 81 MG tablet Take by mouth.   Yes [provider]  baclofen (LIORESAL) 10 MG tablet Take by mouth. 01/20/17  Yes [provider]  Cholecalciferol (VITAMIN D3) 2000 units capsule Take by mouth.   Yes [provider]  hydrochlorothiazide (HYDRODIURIL) 25 MG tablet Take by mouth. 01/20/17  Yes [provider]  methocarbamol (ROBAXIN) 750 MG tablet Take by mouth. 01/20/17  Yes [provider]  ibuprofen (ADVIL,MOTRIN) 200 MG tablet Take by mouth.    [provider]  nitrofurantoin, macrocrystal-monohydrate, (MACROBID) 100 MG capsule Take one capsule after intercourse to prevent UTI 04/27/18   Rod Can, CNM    Physical Exam Vitals: Blood pressure 132/82, height 5\' 8"  (1.727 m), weight 216 lb (98 kg).  General: NAD HEENT: normocephalic, anicteric Thyroid: no enlargement, no palpable nodules Pulmonary: No increased work of breathing, CTAB Cardiovascular: RRR, distal pulses 2+ Breast: Breast symmetrical, no tenderness, no palpable nodules or masses, no skin or nipple retraction present, no nipple discharge.  No axillary or supraclavicular lymphadenopathy. Abdomen: NABS, soft, non-tender, non-distended.  Umbilicus without lesions.  No hepatomegaly, splenomegaly or masses palpable. No evidence of hernia  Genitourinary: deferred for no concerns/PAP interval Extremities: no edema, erythema, or tenderness Neurologic: Grossly  intact Psychiatric: mood appropriate, affect full     Assessment: 67 y.o. G0P0000 routine annual exam  Plan: Problem List Items Addressed This Visit    None    Visit Diagnoses    Well woman exam without gynecological exam    -  Primary   History of recurrent UTIs       Relevant Medications   nitrofurantoin, macrocrystal-monohydrate, (MACROBID) 100 MG capsule   Screening for osteoporosis       Relevant Orders   DG Bone Density      1) Mammogram - recommend yearly screening mammogram.  Mammogram Is up to date  2) STI screening  was offered and declined  3) ASCCP guidelines and rational discussed.  Patient opts for every 2 year screening interval  4) Osteoporosis  -  per USPTF routine screening DEXA at age 51 Last bone density scan was in 2011 with low fracture risk    Consider FDA-approved medical therapies in postmenopausal women and men aged 30 years and older, based on the following: a) A hip or vertebral (clinical or morphometric) fracture b) T-score ? -2.5 at the femoral neck or spine after appropriate evaluation to exclude secondary causes C) Low bone mass (T-score between -1.0 and -2.5 at the femoral neck or spine) and a 10-year probability of a hip fracture ? 3% or a 10-year probability of a major osteoporosis-related fracture ? 20% based on the [place]-adapted WHO algorithm   5) Routine healthcare maintenance including cholesterol, diabetes screening discussed managed by PCP  6) Colonoscopy: patient declines at this time and will schedule after her knee surgery.  Screening recommended starting at age 64 for average risk individuals, age 94 for individuals deemed at increased risk (including African Americans) and recommended to continue until age 47.  For patient age 30-85 individualized approach is recommended.  Gold standard screening is via colonoscopy, Cologuard screening is an acceptable alternative for patient unwilling or unable to undergo colonoscopy.  "Colorectal  cancer screening for average?risk adults: 2018 guideline update from the American Cancer Society"CA: A Cancer Journal for Clinicians: Oct 21, 2016   7) Return in 1 year (on 04/28/2019) for annual established gyn.    Rod Can, CNM [facility], [facility] 04/27/2018, 11:21 AM

## 2018-06-20 NOTE — Discharge Instructions (Signed)
°  Instructions after Total Knee Replacement ° ° Ottis Sarnowski P. Ziv Welchel, Jr., M.D.    ° Dept. of Orthopaedics & Sports Medicine ° [facility] ° [address] ° [place], [place]  [address] ° Phone: [phone]   Fax: [phone] ° °  °DIET: °• Drink plenty of non-alcoholic fluids. °• Resume your normal diet. Include foods high in fiber. ° °ACTIVITY:  °• You may use crutches or a walker with weight-bearing as tolerated, unless instructed otherwise. °• You may be weaned off of the walker or crutches by your Physical Therapist.  °• Do NOT place pillows under the knee. Anything placed under the knee could limit your ability to straighten the knee.   °• Continue doing gentle exercises. Exercising will reduce the pain and swelling, increase motion, and prevent muscle weakness.   °• Please continue to use the TED compression stockings for 6 weeks. You may remove the stockings at night, but should reapply them in the morning. °• Do not drive or operate any equipment until instructed. ° °WOUND CARE:  °• Continue to use the PolarCare or ice packs periodically to reduce pain and swelling. °• You may bathe or shower after the staples are removed at the first office visit following surgery. ° °MEDICATIONS: °• You may resume your regular medications. °• Please take the pain medication as prescribed on the medication. °• Do not take pain medication on an empty stomach. °• You have been given a prescription for a blood thinner (Lovenox or Coumadin). Please take the medication as instructed. (NOTE: After completing a 2 week course of Lovenox, take one Enteric-coated aspirin once a day. This along with elevation will help reduce the possibility of phlebitis in your operated leg.) °• Do not drive or drink alcoholic beverages when taking pain medications. ° °CALL THE OFFICE FOR: °• Temperature above 101 degrees °• Excessive bleeding or drainage on the dressing. °• Excessive swelling, coldness, or paleness of the toes. °• Persistent  nausea and vomiting. ° °FOLLOW-UP:  °• You should have an appointment to return to the office in 10-14 days after surgery. °• Arrangements have been made for continuation of Physical Therapy (either home therapy or outpatient therapy). °  °

## 2018-06-22 ENCOUNTER — Encounter
Admission: RE | Admit: 2018-06-22 | Discharge: 2018-06-22 | Disposition: A | Discharge status: Home / Self Care | Payer: Medicare Other | Source: Ambulatory Visit | Attending: Orthopedic Surgery | Admitting: Orthopedic Surgery

## 2018-06-22 ENCOUNTER — Other Ambulatory Visit: Payer: Self-pay

## 2018-06-22 DIAGNOSIS — Z01818 Encounter for other preprocedural examination: Secondary | ICD-10-CM | POA: Insufficient documentation

## 2018-06-22 HISTORY — DX: Vitamin D deficiency, unspecified: E55.9

## 2018-06-22 HISTORY — DX: Family history of other specified conditions: Z84.89

## 2018-06-22 HISTORY — DX: Pneumonia, unspecified organism: J18.9

## 2018-06-22 HISTORY — DX: Nausea with vomiting, unspecified: R11.2

## 2018-06-22 HISTORY — DX: Other specified postprocedural states: Z98.890

## 2018-06-22 HISTORY — DX: Unilateral primary osteoarthritis, right knee: M17.11

## 2018-06-22 LAB — COMPREHENSIVE METABOLIC PANEL
ALBUMIN: 4.1 g/dL (ref 3.5–5.0)
ALT: 25 U/L (ref 0–44)
AST: 25 U/L (ref 15–41)
Alkaline Phosphatase: 90 U/L (ref 38–126)
Anion gap: 8 (ref 5–15)
BUN: 19 mg/dL (ref 8–23)
CO2: 32 mmol/L (ref 22–32)
Calcium: 9.8 mg/dL (ref 8.9–10.3)
Chloride: 99 mmol/L (ref 98–111)
Creatinine, Ser: 0.71 mg/dL (ref 0.44–1.00)
GFR calc Af Amer: >60 mL/min (ref >60)
GFR calc non Af Amer: >60 mL/min (ref >60)
GLUCOSE: 120 mg/dL — AB (ref 70–99)
Potassium: 3.6 mmol/L (ref 3.5–5.1)
SODIUM: 139 mmol/L (ref 135–145)
Total Bilirubin: 1 mg/dL (ref 0.3–1.2)
Total Protein: 7.2 g/dL (ref 6.5–8.1)

## 2018-06-22 LAB — SURGICAL PCR SCREEN
MRSA, PCR: NEGATIVE
Staphylococcus aureus: NEGATIVE

## 2018-06-22 LAB — CBC
HCT: 43.1 % (ref 36.0–46.0)
Hemoglobin: 13.7 g/dL (ref 12.0–15.0)
MCH: 29 pg (ref 26.0–34.0)
MCHC: 31.8 g/dL (ref 30.0–36.0)
MCV: 91.1 fL (ref 80.0–100.0)
Platelets: 190 10*3/uL (ref 150–400)
RBC: 4.73 MIL/uL (ref 3.87–5.11)
RDW: 13.2 % (ref 11.5–15.5)
WBC: 4.3 10*3/uL (ref 4.0–10.5)
nRBC: 0 % (ref 0.0–0.2)

## 2018-06-22 LAB — URINALYSIS, ROUTINE W REFLEX MICROSCOPIC
Bilirubin Urine: NEGATIVE
Glucose, UA: NEGATIVE mg/dL
Hgb urine dipstick: NEGATIVE
Ketones, ur: NEGATIVE mg/dL
Leukocytes, UA: NEGATIVE
Nitrite: NEGATIVE
Protein, ur: NEGATIVE mg/dL
Specific Gravity, Urine: 1.023 (ref 1.005–1.030)
pH: 5 (ref 5.0–8.0)

## 2018-06-22 LAB — PROTIME-INR
INR: 0.89
Prothrombin Time: 12 seconds (ref 11.4–15.2)

## 2018-06-22 LAB — C-REACTIVE PROTEIN: CRP: <0.8 mg/dL (ref <1.0)

## 2018-06-22 LAB — TYPE AND SCREEN
ABO/RH(D): A POS
Antibody Screen: NEGATIVE

## 2018-06-22 LAB — SEDIMENTATION RATE: SED RATE: 6 mm/h (ref 0–30)

## 2018-06-22 LAB — APTT: APTT: 26 s (ref 24–36)

## 2018-06-22 NOTE — Patient Instructions (Signed)
Your procedure is scheduled on: Wednesday, February 12, 20 Report to Day Surgery on the 2nd floor of the [facility]. To find out your arrival time, please call [phone] between 1PM - 3PM on: Tuesday, February 11, 20  REMEMBER: Instructions that are not followed completely may result in serious medical risk, up to and including death; or upon the discretion of your surgeon and anesthesiologist your surgery may need to be rescheduled.  Do not eat food after midnight the night before surgery.  No gum chewing, lozengers or hard candies.  You may however, drink CLEAR liquids up to 2 hours before you are scheduled to arrive for your surgery. Do not drink anything within 2 hours of the start of your surgery.  Clear liquids include: - water  - apple juice without pulp - gatorade - black coffee or tea (Do NOT add milk or creamers to the coffee or tea) Do NOT drink anything that is not on this list.  No Alcohol for 24 hours before or after surgery.  No Smoking including e-cigarettes for 24 hours prior to surgery.  No chewable tobacco products for at least 6 hours prior to surgery.  No nicotine patches on the day of surgery.  On the morning of surgery brush your teeth with toothpaste and water, you may rinse your mouth with mouthwash if you wish. Do not swallow any toothpaste or mouthwash.  Notify your doctor if there is any change in your medical condition (cold, fever, infection).  Do not wear jewelry, make-up, hairpins, clips or nail polish.  Do not wear lotions, powders, or perfumes.   Do not shave 48 hours prior to surgery.   Contacts and dentures may not be worn into surgery.  Do not bring valuables to the hospital, including drivers license, insurance or credit cards.  [facility] is not responsible for any belongings or valuables.   TAKE NO MEDICATIONS THE MORNING OF SURGERY  Use CHG Soap as directed on instruction sheet.  Starting February 5 - Stop aspirin and  Anti-inflammatories (NSAIDS) such as Advil, Aleve, Ibuprofen, Motrin, Naproxen, Naprosyn and Aspirin based products such as Excedrin, Goodys Powder, BC Powder. (May take Tylenol or Acetaminophen if needed.)  Starting February 5 - Stop ANY OVER THE COUNTER supplements until after surgery. (May continue Vitamin D.)  Wear comfortable clothing (specific to your surgery type) to the hospital.  Plan for stool softeners for home use.  If you are being admitted to the hospital overnight, leave your suitcase in the car. After surgery it may be brought to your room.  Please call [phone] if you have any questions about these instructions.

## 2018-06-23 LAB — URINE CULTURE
Culture: NO GROWTH
Special Requests: NORMAL

## 2018-07-05 MED ORDER — CEFAZOLIN SODIUM-DEXTROSE 2-4 GM/100ML-% IV SOLN
2.0000 g | INTRAVENOUS | Status: AC
  Administered 2018-07-06: 2 g via INTRAVENOUS

## 2018-07-05 MED ORDER — TRANEXAMIC ACID-NACL 1000-0.7 MG/100ML-% IV SOLN
1000.0000 mg | INTRAVENOUS | Status: AC
  Administered 2018-07-06: 1000 mg via INTRAVENOUS
  Filled 2018-07-05: qty 100

## 2018-07-06 ENCOUNTER — Inpatient Hospital Stay: Payer: Medicare Other

## 2018-07-06 ENCOUNTER — Encounter: Payer: Self-pay | Admitting: Orthopedic Surgery

## 2018-07-06 ENCOUNTER — Encounter
Admission: RE | Disposition: A | Discharge status: Home Health | Payer: Self-pay | Source: Home / Self Care | Attending: Orthopedic Surgery

## 2018-07-06 ENCOUNTER — Inpatient Hospital Stay: Payer: Medicare Other | Admitting: Certified Registered Nurse Anesthetist

## 2018-07-06 ENCOUNTER — Other Ambulatory Visit: Payer: Self-pay

## 2018-07-06 ENCOUNTER — Inpatient Hospital Stay
Admission: RE | Admit: 2018-07-06 | Discharge: 2018-07-08 | DRG: 470 | Disposition: A | Discharge status: Home Health | Payer: Medicare Other | Attending: Orthopedic Surgery | Admitting: Orthopedic Surgery

## 2018-07-06 DIAGNOSIS — Z96651 Presence of right artificial knee joint: Secondary | ICD-10-CM

## 2018-07-06 DIAGNOSIS — Z8249 Family history of ischemic heart disease and other diseases of the circulatory system: Secondary | ICD-10-CM | POA: Diagnosis not present

## 2018-07-06 DIAGNOSIS — E559 Vitamin D deficiency, unspecified: Secondary | ICD-10-CM | POA: Diagnosis present

## 2018-07-06 DIAGNOSIS — Z79899 Other long term (current) drug therapy: Secondary | ICD-10-CM | POA: Diagnosis not present

## 2018-07-06 DIAGNOSIS — Z833 Family history of diabetes mellitus: Secondary | ICD-10-CM | POA: Diagnosis not present

## 2018-07-06 DIAGNOSIS — M1711 Unilateral primary osteoarthritis, right knee: Secondary | ICD-10-CM | POA: Diagnosis present

## 2018-07-06 DIAGNOSIS — M25761 Osteophyte, right knee: Secondary | ICD-10-CM | POA: Diagnosis present

## 2018-07-06 DIAGNOSIS — Z96659 Presence of unspecified artificial knee joint: Secondary | ICD-10-CM

## 2018-07-06 HISTORY — PX: KNEE ARTHROPLASTY: SHX992

## 2018-07-06 LAB — ABO/RH: ABO/RH(D): A POS

## 2018-07-06 SURGERY — ARTHROPLASTY, KNEE, TOTAL, USING IMAGELESS COMPUTER-ASSISTED NAVIGATION
Anesthesia: Spinal | Laterality: Right

## 2018-07-06 MED ORDER — FENTANYL CITRATE (PF) 100 MCG/2ML IJ SOLN
INTRAMUSCULAR | Status: AC
  Administered 2018-07-06: 50 ug via INTRAVENOUS
  Filled 2018-07-06: qty 2

## 2018-07-06 MED ORDER — SODIUM CHLORIDE 0.9 % IV SOLN
INTRAVENOUS | Status: DC | PRN
  Administered 2018-07-06: 60 mL

## 2018-07-06 MED ORDER — OXYCODONE HCL 5 MG PO TABS
5.0000 mg | ORAL_TABLET | ORAL | Status: DC | PRN
  Administered 2018-07-07 – 2018-07-08 (×5): 5 mg via ORAL
  Filled 2018-07-06 (×5): qty 1

## 2018-07-06 MED ORDER — METOCLOPRAMIDE HCL 10 MG PO TABS
10.0000 mg | ORAL_TABLET | Freq: Three times a day (TID) | ORAL | Status: AC
  Administered 2018-07-06 – 2018-07-08 (×8): 10 mg via ORAL
  Filled 2018-07-06 (×7): qty 1

## 2018-07-06 MED ORDER — PROPOFOL 10 MG/ML IV BOLUS
INTRAVENOUS | Status: DC | PRN
  Administered 2018-07-06: 30 mg via INTRAVENOUS
  Administered 2018-07-06 (×3): 18 mg via INTRAVENOUS

## 2018-07-06 MED ORDER — BUPIVACAINE HCL (PF) 0.25 % IJ SOLN
INTRAMUSCULAR | Status: AC
  Filled 2018-07-06: qty 60

## 2018-07-06 MED ORDER — GABAPENTIN 300 MG PO CAPS
300.0000 mg | ORAL_CAPSULE | Freq: Once | ORAL | Status: AC
  Administered 2018-07-06: 300 mg via ORAL

## 2018-07-06 MED ORDER — DEXAMETHASONE SODIUM PHOSPHATE 10 MG/ML IJ SOLN
8.0000 mg | Freq: Once | INTRAMUSCULAR | Status: AC
  Administered 2018-07-06: 8 mg via INTRAVENOUS

## 2018-07-06 MED ORDER — CHLORHEXIDINE GLUCONATE 4 % EX LIQD: 60.0000 mL | Freq: Once | CUTANEOUS | Status: DC

## 2018-07-06 MED ORDER — BUPIVACAINE HCL (PF) 0.25 % IJ SOLN
INTRAMUSCULAR | Status: AC
  Filled 2018-07-06: qty 30

## 2018-07-06 MED ORDER — PHENYLEPHRINE HCL 10 MG/ML IJ SOLN
INTRAMUSCULAR | Status: AC
  Filled 2018-07-06: qty 2

## 2018-07-06 MED ORDER — LIDOCAINE HCL (PF) 2 % IJ SOLN
INTRAMUSCULAR | Status: AC
  Filled 2018-07-06: qty 10

## 2018-07-06 MED ORDER — FAMOTIDINE 20 MG PO TABS
20.0000 mg | ORAL_TABLET | Freq: Once | ORAL | Status: AC
  Administered 2018-07-06: 20 mg via ORAL

## 2018-07-06 MED ORDER — CELECOXIB 200 MG PO CAPS
400.0000 mg | ORAL_CAPSULE | Freq: Once | ORAL | Status: AC
  Administered 2018-07-06: 400 mg via ORAL

## 2018-07-06 MED ORDER — BISACODYL 10 MG RE SUPP
10.0000 mg | Freq: Every day | RECTAL | Status: DC | PRN
  Administered 2018-07-08: 10 mg via RECTAL
  Filled 2018-07-06: qty 1

## 2018-07-06 MED ORDER — TRAMADOL HCL 50 MG PO TABS
50.0000 mg | ORAL_TABLET | ORAL | Status: DC | PRN
  Administered 2018-07-06 – 2018-07-08 (×5): 100 mg via ORAL
  Filled 2018-07-06 (×5): qty 2

## 2018-07-06 MED ORDER — PROPOFOL 500 MG/50ML IV EMUL
INTRAVENOUS | Status: DC | PRN
  Administered 2018-07-06: 60 ug/kg/min via INTRAVENOUS

## 2018-07-06 MED ORDER — TRANEXAMIC ACID-NACL 1000-0.7 MG/100ML-% IV SOLN
1000.0000 mg | Freq: Once | INTRAVENOUS | Status: AC
  Administered 2018-07-06: 1000 mg via INTRAVENOUS
  Filled 2018-07-06: qty 100

## 2018-07-06 MED ORDER — ONDANSETRON HCL 4 MG PO TABS: 4.0000 mg | ORAL_TABLET | Freq: Four times a day (QID) | ORAL | Status: DC | PRN

## 2018-07-06 MED ORDER — SODIUM CHLORIDE (PF) 0.9 % IJ SOLN
INTRAMUSCULAR | Status: AC
  Filled 2018-07-06: qty 50

## 2018-07-06 MED ORDER — TETRACAINE HCL 1 % IJ SOLN
INTRAMUSCULAR | Status: DC | PRN
  Administered 2018-07-06: 4 mg via INTRASPINAL

## 2018-07-06 MED ORDER — DEXAMETHASONE SODIUM PHOSPHATE 10 MG/ML IJ SOLN
INTRAMUSCULAR | Status: AC
  Administered 2018-07-06: 8 mg via INTRAVENOUS
  Filled 2018-07-06: qty 1

## 2018-07-06 MED ORDER — ACETAMINOPHEN 325 MG PO TABS: 325.0000 mg | ORAL_TABLET | Freq: Four times a day (QID) | ORAL | Status: DC | PRN

## 2018-07-06 MED ORDER — FAMOTIDINE 20 MG PO TABS
ORAL_TABLET | ORAL | Status: AC
  Administered 2018-07-06: 20 mg via ORAL
  Filled 2018-07-06: qty 1

## 2018-07-06 MED ORDER — FENTANYL CITRATE (PF) 100 MCG/2ML IJ SOLN
INTRAMUSCULAR | Status: AC
  Filled 2018-07-06: qty 2

## 2018-07-06 MED ORDER — MENTHOL 3 MG MT LOZG: 1.0000 | LOZENGE | OROMUCOSAL | Status: DC | PRN

## 2018-07-06 MED ORDER — HYDROMORPHONE HCL 1 MG/ML IJ SOLN
0.5000 mg | INTRAMUSCULAR | Status: DC | PRN
  Administered 2018-07-06: 1 mg via INTRAVENOUS
  Filled 2018-07-06: qty 1

## 2018-07-06 MED ORDER — PROPOFOL 10 MG/ML IV BOLUS
INTRAVENOUS | Status: AC
  Filled 2018-07-06: qty 20

## 2018-07-06 MED ORDER — ONDANSETRON HCL 4 MG/2ML IJ SOLN
4.0000 mg | Freq: Four times a day (QID) | INTRAMUSCULAR | Status: DC | PRN
  Administered 2018-07-06 (×2): 4 mg via INTRAVENOUS
  Filled 2018-07-06 (×2): qty 2

## 2018-07-06 MED ORDER — SENNOSIDES-DOCUSATE SODIUM 8.6-50 MG PO TABS
1.0000 | ORAL_TABLET | Freq: Two times a day (BID) | ORAL | Status: DC
  Administered 2018-07-06 – 2018-07-08 (×4): 1 via ORAL
  Filled 2018-07-06 (×4): qty 1

## 2018-07-06 MED ORDER — FERROUS SULFATE 325 (65 FE) MG PO TABS
325.0000 mg | ORAL_TABLET | Freq: Two times a day (BID) | ORAL | Status: DC
  Administered 2018-07-06 – 2018-07-08 (×4): 325 mg via ORAL
  Filled 2018-07-06 (×4): qty 1

## 2018-07-06 MED ORDER — BUPIVACAINE HCL (PF) 0.5 % IJ SOLN
INTRAMUSCULAR | Status: AC
  Filled 2018-07-06: qty 10

## 2018-07-06 MED ORDER — PROPOFOL 500 MG/50ML IV EMUL
INTRAVENOUS | Status: AC
  Filled 2018-07-06: qty 50

## 2018-07-06 MED ORDER — GENTAMICIN SULFATE 40 MG/ML IJ SOLN
INTRAMUSCULAR | Status: AC
  Filled 2018-07-06: qty 8

## 2018-07-06 MED ORDER — METOCLOPRAMIDE HCL 5 MG/ML IJ SOLN: 5.0000 mg | Freq: Three times a day (TID) | INTRAMUSCULAR | Status: DC | PRN

## 2018-07-06 MED ORDER — LACTATED RINGERS IV SOLN
INTRAVENOUS | Status: DC
  Administered 2018-07-06 (×2): via INTRAVENOUS

## 2018-07-06 MED ORDER — CEFAZOLIN SODIUM-DEXTROSE 2-4 GM/100ML-% IV SOLN
INTRAVENOUS | Status: AC
  Filled 2018-07-06: qty 100

## 2018-07-06 MED ORDER — ENOXAPARIN SODIUM 30 MG/0.3ML ~~LOC~~ SOLN
30.0000 mg | Freq: Two times a day (BID) | SUBCUTANEOUS | Status: DC
  Administered 2018-07-07 – 2018-07-08 (×3): 30 mg via SUBCUTANEOUS
  Filled 2018-07-06 (×3): qty 0.3

## 2018-07-06 MED ORDER — SODIUM CHLORIDE 0.9 % IV SOLN
INTRAVENOUS | Status: DC
  Administered 2018-07-06 (×2): via INTRAVENOUS

## 2018-07-06 MED ORDER — ACETAMINOPHEN 10 MG/ML IV SOLN
INTRAVENOUS | Status: DC | PRN
  Administered 2018-07-06: 1000 mg via INTRAVENOUS

## 2018-07-06 MED ORDER — SODIUM CHLORIDE 0.9 % IV SOLN
INTRAVENOUS | Status: DC | PRN
  Administered 2018-07-06: 30 ug/min via INTRAVENOUS

## 2018-07-06 MED ORDER — GABAPENTIN 300 MG PO CAPS
300.0000 mg | ORAL_CAPSULE | Freq: Every day | ORAL | Status: DC
  Administered 2018-07-06 – 2018-07-07 (×2): 300 mg via ORAL
  Filled 2018-07-06 (×2): qty 1

## 2018-07-06 MED ORDER — GLYCOPYRROLATE 0.2 MG/ML IJ SOLN
INTRAMUSCULAR | Status: AC
  Filled 2018-07-06: qty 1

## 2018-07-06 MED ORDER — MAGNESIUM HYDROXIDE 400 MG/5ML PO SUSP
30.0000 mL | Freq: Every day | ORAL | Status: DC
  Administered 2018-07-08: 30 mL via ORAL
  Filled 2018-07-06 (×2): qty 30

## 2018-07-06 MED ORDER — SODIUM CHLORIDE 0.9 % IV SOLN
INTRAVENOUS | Status: DC | PRN
  Administered 2018-07-06: 3000 mL

## 2018-07-06 MED ORDER — ACETAMINOPHEN 10 MG/ML IV SOLN
1000.0000 mg | Freq: Four times a day (QID) | INTRAVENOUS | Status: AC
  Administered 2018-07-06 – 2018-07-07 (×4): 1000 mg via INTRAVENOUS
  Filled 2018-07-06 (×4): qty 100

## 2018-07-06 MED ORDER — DIPHENHYDRAMINE HCL 12.5 MG/5ML PO ELIX: 12.5000 mg | ORAL_SOLUTION | ORAL | Status: DC | PRN

## 2018-07-06 MED ORDER — FLEET ENEMA 7-19 GM/118ML RE ENEM: 1.0000 | ENEMA | Freq: Once | RECTAL | Status: DC | PRN

## 2018-07-06 MED ORDER — BUPIVACAINE LIPOSOME 1.3 % IJ SUSP
INTRAMUSCULAR | Status: AC
  Filled 2018-07-06: qty 20

## 2018-07-06 MED ORDER — PANTOPRAZOLE SODIUM 40 MG PO TBEC
40.0000 mg | DELAYED_RELEASE_TABLET | Freq: Two times a day (BID) | ORAL | Status: DC
  Administered 2018-07-06 – 2018-07-07 (×4): 40 mg via ORAL
  Filled 2018-07-06 (×4): qty 1

## 2018-07-06 MED ORDER — CEFAZOLIN SODIUM-DEXTROSE 2-4 GM/100ML-% IV SOLN
2.0000 g | Freq: Four times a day (QID) | INTRAVENOUS | Status: AC
  Administered 2018-07-06 – 2018-07-07 (×4): 2 g via INTRAVENOUS
  Filled 2018-07-06 (×4): qty 100

## 2018-07-06 MED ORDER — MIDAZOLAM HCL 5 MG/5ML IJ SOLN
INTRAMUSCULAR | Status: DC | PRN
  Administered 2018-07-06: 2 mg via INTRAVENOUS

## 2018-07-06 MED ORDER — HYDROCHLOROTHIAZIDE 25 MG PO TABS: 12.5000 mg | ORAL_TABLET | Freq: Every day | ORAL | Status: DC | PRN

## 2018-07-06 MED ORDER — MIDAZOLAM HCL 2 MG/2ML IJ SOLN
INTRAMUSCULAR | Status: AC
  Filled 2018-07-06: qty 2

## 2018-07-06 MED ORDER — CELECOXIB 200 MG PO CAPS
200.0000 mg | ORAL_CAPSULE | Freq: Two times a day (BID) | ORAL | Status: DC
  Administered 2018-07-06 – 2018-07-07 (×3): 200 mg via ORAL
  Filled 2018-07-06 (×4): qty 1

## 2018-07-06 MED ORDER — CELECOXIB 200 MG PO CAPS
ORAL_CAPSULE | ORAL | Status: AC
  Administered 2018-07-06: 400 mg via ORAL
  Filled 2018-07-06: qty 2

## 2018-07-06 MED ORDER — ONDANSETRON HCL 4 MG/2ML IJ SOLN: 4.0000 mg | Freq: Once | INTRAMUSCULAR | Status: DC | PRN

## 2018-07-06 MED ORDER — BACLOFEN 10 MG PO TABS
10.0000 mg | ORAL_TABLET | Freq: Every day | ORAL | Status: DC | PRN
  Administered 2018-07-07: 10 mg via ORAL
  Filled 2018-07-06 (×3): qty 1

## 2018-07-06 MED ORDER — ACETAMINOPHEN 10 MG/ML IV SOLN
INTRAVENOUS | Status: AC
  Filled 2018-07-06: qty 100

## 2018-07-06 MED ORDER — FENTANYL CITRATE (PF) 100 MCG/2ML IJ SOLN
INTRAMUSCULAR | Status: DC | PRN
  Administered 2018-07-06: 50 ug via INTRAVENOUS

## 2018-07-06 MED ORDER — METOCLOPRAMIDE HCL 10 MG PO TABS
5.0000 mg | ORAL_TABLET | Freq: Three times a day (TID) | ORAL | Status: DC | PRN
  Filled 2018-07-06: qty 1

## 2018-07-06 MED ORDER — BUPIVACAINE HCL (PF) 0.5 % IJ SOLN
INTRAMUSCULAR | Status: DC | PRN
  Administered 2018-07-06: 2.6 mL

## 2018-07-06 MED ORDER — ALPRAZOLAM 0.25 MG PO TABS: 0.2500 mg | ORAL_TABLET | Freq: Every day | ORAL | Status: DC | PRN

## 2018-07-06 MED ORDER — GABAPENTIN 300 MG PO CAPS
ORAL_CAPSULE | ORAL | Status: AC
  Administered 2018-07-06: 300 mg via ORAL
  Filled 2018-07-06: qty 1

## 2018-07-06 MED ORDER — OXYCODONE HCL 5 MG PO TABS
10.0000 mg | ORAL_TABLET | ORAL | Status: DC | PRN
  Administered 2018-07-06: 10 mg via ORAL
  Filled 2018-07-06: qty 2

## 2018-07-06 MED ORDER — VITAMIN D 25 MCG (1000 UNIT) PO TABS
5000.0000 [IU] | ORAL_TABLET | Freq: Every day | ORAL | Status: DC
  Administered 2018-07-07: 5000 [IU] via ORAL
  Filled 2018-07-06: qty 5

## 2018-07-06 MED ORDER — ALUM & MAG HYDROXIDE-SIMETH 200-200-20 MG/5ML PO SUSP: 30.0000 mL | ORAL | Status: DC | PRN

## 2018-07-06 MED ORDER — PHENOL 1.4 % MT LIQD: 1.0000 | OROMUCOSAL | Status: DC | PRN

## 2018-07-06 MED ORDER — BUPIVACAINE HCL (PF) 0.25 % IJ SOLN
INTRAMUSCULAR | Status: DC | PRN
  Administered 2018-07-06: 60 mL

## 2018-07-06 MED ORDER — FENTANYL CITRATE (PF) 100 MCG/2ML IJ SOLN
25.0000 ug | INTRAMUSCULAR | Status: DC | PRN
  Administered 2018-07-06 (×2): 50 ug via INTRAVENOUS

## 2018-07-06 SURGICAL SUPPLY — 68 items
ATTUNE MED DOME PAT 38 KNEE (Knees) ×2 IMPLANT
ATTUNE MED DOME PAT 38MM KNEE (Knees) ×1 IMPLANT
ATTUNE PSFEM RTSZ6 NARCEM KNEE (Femur) ×3 IMPLANT
ATTUNE PSRP INSR SZ6 5 KNEE (Insert) ×2 IMPLANT
ATTUNE PSRP INSR SZ6 5MM KNEE (Insert) ×1 IMPLANT
BASE TIBIA ATTUNE KNEE SYS SZ6 (Knees) ×1 IMPLANT
BATTERY INSTRU NAVIGATION (MISCELLANEOUS) ×12 IMPLANT
BLADE SAW 70X12.5 (BLADE) ×3 IMPLANT
BLADE SAW 90X13X1.19 OSCILLAT (BLADE) ×3 IMPLANT
BLADE SAW 90X25X1.19 OSCILLAT (BLADE) ×3 IMPLANT
CANISTER SUCT 1200ML W/VALVE (MISCELLANEOUS) ×3 IMPLANT
CANISTER SUCT 3000ML PPV (MISCELLANEOUS) ×6 IMPLANT
CEMENT HV SMART SET (Cement) ×6 IMPLANT
COOLER POLAR GLACIER W/PUMP (MISCELLANEOUS) ×3 IMPLANT
COVER WAND RF STERILE (DRAPES) IMPLANT
CUFF TOURN 24 STER (MISCELLANEOUS) IMPLANT
CUFF TOURN 30 STER DUAL PORT (MISCELLANEOUS) ×3 IMPLANT
DRAPE SHEET LG 3/4 BI-LAMINATE (DRAPES) ×3 IMPLANT
DRSG DERMACEA 8X12 NADH (GAUZE/BANDAGES/DRESSINGS) ×3 IMPLANT
DRSG OPSITE POSTOP 4X14 (GAUZE/BANDAGES/DRESSINGS) ×3 IMPLANT
DRSG TEGADERM 4X4.75 (GAUZE/BANDAGES/DRESSINGS) ×3 IMPLANT
DURAPREP 26ML APPLICATOR (WOUND CARE) ×6 IMPLANT
ELECT CAUTERY BLADE 6.4 (BLADE) ×3 IMPLANT
ELECT REM PT RETURN 9FT ADLT (ELECTROSURGICAL) ×3
ELECTRODE REM PT RTRN 9FT ADLT (ELECTROSURGICAL) ×1 IMPLANT
EX-PIN ORTHOLOCK NAV 4X150 (PIN) ×6 IMPLANT
GLOVE BIOGEL M STRL SZ7.5 (GLOVE) ×6 IMPLANT
GLOVE INDICATOR 8.0 STRL GRN (GLOVE) ×3 IMPLANT
GOWN STRL REUS W/ TWL LRG LVL3 (GOWN DISPOSABLE) ×2 IMPLANT
GOWN STRL REUS W/TWL LRG LVL3 (GOWN DISPOSABLE) ×4
HEMOVAC 400CC 10FR (MISCELLANEOUS) ×3 IMPLANT
HOLDER FOLEY CATH W/STRAP (MISCELLANEOUS) ×3 IMPLANT
HOOD PEEL AWAY FLYTE STAYCOOL (MISCELLANEOUS) ×6 IMPLANT
KIT TURNOVER KIT A (KITS) ×3 IMPLANT
KNIFE SCULPS 14X20 (INSTRUMENTS) ×3 IMPLANT
LABEL OR SOLS (LABEL) ×3 IMPLANT
MANIFOLD NEPTUNE WASTE (CANNULA) ×3 IMPLANT
NDL SAFETY ECLIPSE 18X1.5 (NEEDLE) ×1 IMPLANT
NEEDLE HYPO 18GX1.5 SHARP (NEEDLE) ×2
NEEDLE SPNL 20GX3.5 QUINCKE YW (NEEDLE) ×6 IMPLANT
NS IRRIG 500ML POUR BTL (IV SOLUTION) ×3 IMPLANT
PACK TOTAL KNEE (MISCELLANEOUS) ×3 IMPLANT
PAD WRAPON POLAR KNEE (MISCELLANEOUS) ×1 IMPLANT
PENCIL SMOKE ULTRAEVAC 22 CON (MISCELLANEOUS) ×3 IMPLANT
PIN DRILL QUICK PACK ×3 IMPLANT
PIN FIXATION 1/8DIA X 3INL (PIN) ×9 IMPLANT
PULSAVAC PLUS IRRIG FAN TIP (DISPOSABLE) ×3
SOL .9 NS 3000ML IRR  AL (IV SOLUTION) ×2
SOL .9 NS 3000ML IRR UROMATIC (IV SOLUTION) ×1 IMPLANT
SOL PREP PVP 2OZ (MISCELLANEOUS) ×3
SOLUTION PREP PVP 2OZ (MISCELLANEOUS) ×1 IMPLANT
SPONGE DRAIN TRACH 4X4 STRL 2S (GAUZE/BANDAGES/DRESSINGS) ×3 IMPLANT
STAPLER SKIN PROX 35W (STAPLE) ×3 IMPLANT
STOCKINETTE IMPERV 14X48 (MISCELLANEOUS) IMPLANT
STRAP TIBIA SHORT (MISCELLANEOUS) ×3 IMPLANT
SUCTION FRAZIER HANDLE 10FR (MISCELLANEOUS) ×2
SUCTION TUBE FRAZIER 10FR DISP (MISCELLANEOUS) ×1 IMPLANT
SUT VIC AB 0 CT1 36 (SUTURE) ×3 IMPLANT
SUT VIC AB 1 CT1 36 (SUTURE) ×6 IMPLANT
SUT VIC AB 2-0 CT2 27 (SUTURE) ×3 IMPLANT
SYR 20CC LL (SYRINGE) ×3 IMPLANT
SYR 30ML LL (SYRINGE) ×6 IMPLANT
TIBIA ATTUNE KNEE SYS BASE SZ6 (Knees) ×3 IMPLANT
TIP FAN IRRIG PULSAVAC PLUS (DISPOSABLE) ×1 IMPLANT
TOWEL OR 17X26 4PK STRL BLUE (TOWEL DISPOSABLE) ×3 IMPLANT
TOWER CARTRIDGE SMART MIX (DISPOSABLE) ×3 IMPLANT
TRAY FOLEY MTR SLVR 16FR STAT (SET/KITS/TRAYS/PACK) ×3 IMPLANT
WRAPON POLAR PAD KNEE (MISCELLANEOUS) ×3

## 2018-07-06 NOTE — Anesthesia Preprocedure Evaluation (Signed)
Anesthesia Evaluation  Patient identified by MRN, date of birth, ID band Patient awake    Reviewed: Allergy & Precautions, H&P , NPO status , Patient's Chart, lab work & pertinent test results, reviewed documented beta blocker date and time   History of Anesthesia Complications (+) PONV, Family history of anesthesia reaction and history of anesthetic complications  Airway Mallampati: I  TM Distance: >3 FB Neck ROM: full    Dental  (+) Caps, Dental Advidsory Given Permanent bridge:   Pulmonary neg pulmonary ROS,           Cardiovascular Exercise Tolerance: Good hypertension, (-) angina(-) CAD, (-) Past MI, (-) Cardiac Stents and (-) CABG (-) dysrhythmias (-) Valvular Problems/Murmurs     Neuro/Psych negative neurological ROS  negative psych ROS   GI/Hepatic negative GI ROS, Neg liver ROS,   Endo/Other  negative endocrine ROS  Renal/GU negative Renal ROS  negative genitourinary   Musculoskeletal   Abdominal   Peds  Hematology negative hematology ROS (+)   Anesthesia Other Findings Past Medical History: No date: Back pain No date: Breast cancer ([place])     Comment:  right breast cancer right: Cancer ([place])     Comment:  positive 2004 No date: Cervical polyp No date: Degenerative arthritis of right knee No date: Diverticulitis No date: Family history of adverse reaction to anesthesia     Comment:  brother became combative in post op No date: Pneumonia     Comment:  as a child "drank kerosine when a baby" No date: PONV (postoperative nausea and vomiting) No date: Status post radiation therapy     Comment:  2004 breast cancer No date: Vitamin D deficiency   Reproductive/Obstetrics negative OB ROS                             Anesthesia Physical Anesthesia Plan  ASA: II  Anesthesia Plan: Spinal   Post-op Pain Management:    Induction:   PONV Risk Score and Plan: 3 and Propofol  infusion and TIVA  Airway Management Planned:   Additional Equipment:   Intra-op Plan:   Post-operative Plan:   Informed Consent: I have reviewed the patients History and Physical, chart, labs and discussed the procedure including the risks, benefits and alternatives for the proposed anesthesia with the patient or authorized representative who has indicated his/her understanding and acceptance.     Dental Advisory Given  Plan Discussed with: Anesthesiologist, CRNA and Surgeon  Anesthesia Plan Comments:         Anesthesia Quick Evaluation

## 2018-07-06 NOTE — Op Note (Signed)
OPERATIVE NOTE  DATE OF SURGERY:  07/06/2018  PATIENT NAME:  Regina Blanchard   DOB: Jul 23, 1950  MRN: [id number]  PRE-OPERATIVE DIAGNOSIS: Degenerative arthrosis of the right knee, primary  POST-OPERATIVE DIAGNOSIS:  Same  PROCEDURE:  Right total knee arthroplasty using computer-assisted navigation  SURGEON:  Marciano Sequin. M.D.  ASSISTANT: Melissa Noon, PA-C (present and scrubbed throughout the case, critical for assistance with exposure, retraction, instrumentation, and closure)  ANESTHESIA: spinal  ESTIMATED BLOOD LOSS: 50 mL  FLUIDS REPLACED: 1300 mL of crystalloid  TOURNIQUET TIME: 101 minutes  DRAINS: 2 medium Hemovac drains  SOFT TISSUE RELEASES: Anterior cruciate ligament, posterior cruciate ligament, deep and superficial medial collateral ligament, patellofemoral ligament  IMPLANTS UTILIZED: DePuy Attune size 6N posterior stabilized femoral component (cemented), size 6 rotating platform tibial component (cemented), 38 mm medialized dome patella (cemented), and a 5 mm stabilized rotating platform polyethylene insert.  INDICATIONS FOR SURGERY: Regina Blanchard is a 68 y.o. year old female with a long history of progressive knee pain. X-rays demonstrated severe degenerative changes in tricompartmental fashion. The patient had not seen any significant improvement despite conservative nonsurgical intervention. After discussion of the risks and benefits of surgical intervention, the patient expressed understanding of the risks benefits and agree with plans for total knee arthroplasty.   The risks, benefits, and alternatives were discussed at length including but not limited to the risks of infection, bleeding, nerve injury, stiffness, blood clots, the need for revision surgery, cardiopulmonary complications, among others, and they were willing to proceed.  PROCEDURE IN DETAIL: The patient was brought into the operating room and, after adequate spinal anesthesia was  achieved, a tourniquet was placed on the patient's upper thigh. The patient's knee and leg were cleaned and prepped with alcohol and DuraPrep and draped in the usual sterile fashion. A "timeout" was performed as per usual protocol. The lower extremity was exsanguinated using an Esmarch, and the tourniquet was inflated to 300 mmHg. An anterior longitudinal incision was made followed by a standard mid vastus approach. The deep fibers of the medial collateral ligament were elevated in a subperiosteal fashion off of the medial flare of the tibia so as to maintain a continuous soft tissue sleeve. The patella was subluxed laterally and the patellofemoral ligament was incised. Inspection of the knee demonstrated severe degenerative changes with full-thickness loss of articular cartilage. Osteophytes were debrided using a rongeur. Anterior and posterior cruciate ligaments were excised. Two 4.0 mm Schanz pins were inserted in the femur and into the tibia for attachment of the array of trackers used for computer-assisted navigation. Hip center was identified using a circumduction technique. Distal landmarks were mapped using the computer. The distal femur and proximal tibia were mapped using the computer. The distal femoral cutting guide was positioned using computer-assisted navigation so as to achieve a 5 distal valgus cut. The femur was sized and it was felt that a size 6N femoral component was appropriate. A size 6 femoral cutting guide was positioned and the anterior cut was performed and verified using the computer. This was followed by completion of the posterior and chamfer cuts. Femoral cutting guide for the central box was then positioned in the center box cut was performed.  Attention was then directed to the proximal tibia. Medial and lateral menisci were excised. The extramedullary tibial cutting guide was positioned using computer-assisted navigation so as to achieve a 0 varus-valgus alignment and 3  posterior slope. The cut was performed and verified using the computer. The proximal  tibia was sized and it was felt that a size 6 tibial tray was appropriate. Tibial and femoral trials were inserted followed by insertion of a 5 mm polyethylene insert. The knee was felt to be tight medially. A Bader elevator was used to elevate the superficial fibers of the medial collateral ligament.  This allowed for excellent mediolateral soft tissue balancing both in flexion and in full extension. Finally, the patella was cut and prepared so as to accommodate a 38 mm medialized dome patella. A patella trial was placed and the knee was placed through a range of motion with excellent patellar tracking appreciated. The femoral trial was removed after debridement of posterior osteophytes. The central post-hole for the tibial component was reamed followed by insertion of a keel punch. Tibial trials were then removed. Cut surfaces of bone were irrigated with copious amounts of normal saline with antibiotic solution using pulsatile lavage and then suctioned dry. Polymethylmethacrylate cement was prepared in the usual fashion using a vacuum mixer. Cement was applied to the cut surface of the proximal tibia as well as along the undersurface of a size 6 rotating platform tibial component. Tibial component was positioned and impacted into place. Excess cement was removed using [employer] fast streamer. Cement was then applied to the cut surfaces of the femur as well as along the posterior flanges of the size 6N femoral component. The femoral component was positioned and impacted into place. Excess cement was removed using [employer] fast streamer. A 5 mm polyethylene trial was inserted and the knee was brought into full extension with steady axial compression applied. Finally, cement was applied to the backside of a 38 mm medialized dome patella and the patellar component was positioned and patellar clamp applied. Excess cement was removed using Air cabin crew. After adequate curing of the cement, the tourniquet was deflated after a total tourniquet time of 101 minutes. Hemostasis was achieved using electrocautery. The knee was irrigated with copious amounts of normal saline with antibiotic solution using pulsatile lavage and then suctioned dry. 20 mL of 1.3% Exparel and 60 mL of 0.25% Marcaine in 40 mL of normal saline was injected along the posterior capsule, medial and lateral gutters, and along the arthrotomy site. A 5 mm stabilized rotating platform polyethylene insert was inserted and the knee was placed through a range of motion with excellent mediolateral soft tissue balancing appreciated and excellent patellar tracking noted. 2 medium drains were placed in the wound bed and brought out through separate stab incisions. The medial parapatellar portion of the incision was reapproximated using interrupted sutures of #1 Vicryl. Subcutaneous tissue was approximated in layers using first #0 Vicryl followed #2-0 Vicryl. The skin was approximated with skin staples. A sterile dressing was applied.  The patient tolerated the procedure well and was transported to the recovery room in stable condition.    Jadarian Mckay P. Holley Bouche., M.D.

## 2018-07-06 NOTE — Anesthesia Procedure Notes (Signed)
Spinal  Patient location during procedure: OR Start time: 07/06/2018 7:24 AM End time: 07/06/2018 7:29 AM Staffing Resident/CRNA: Bernardo Heater, CRNA Performed: resident/CRNA  Preanesthetic Checklist Completed: patient identified, site marked, surgical consent, pre-op evaluation, timeout performed, IV checked, risks and benefits discussed and monitors and equipment checked Spinal Block Patient position: sitting Prep: Betadine and ChloraPrep Patient monitoring: heart rate, continuous pulse ox, blood pressure and cardiac monitor Approach: midline Location: L2-3 Injection technique: single-shot Needle Needle type: Introducer and Pencan  Needle gauge: 24 G Needle length: 9 cm Additional Notes Negative paresthesia. Negative blood return. Positive free-flowing CSF. Expiration date of kit checked and confirmed. Patient tolerated procedure well, without complications.

## 2018-07-06 NOTE — Anesthesia Post-op Follow-up Note (Signed)
Anesthesia QCDR form completed.        

## 2018-07-06 NOTE — Transfer of Care (Signed)
Immediate Anesthesia Transfer of Care Note  Patient: Regina Blanchard  Procedure(s) Performed: COMPUTER ASSISTED TOTAL KNEE ARTHROPLASTY (Right )  Patient Location: PACU  Anesthesia Type:Spinal  Level of Consciousness: awake, alert , oriented and patient cooperative  Airway & Oxygen Therapy: Patient Spontanous Breathing and Patient connected to nasal cannula oxygen  Post-op Assessment: Report given to RN and Post -op Vital signs reviewed and stable  Post vital signs: Reviewed and stable  Last Vitals:  Vitals Value Taken Time  BP 118/65 07/06/2018 11:05 AM  Temp    Pulse 83 07/06/2018 11:05 AM  Resp 12 07/06/2018 11:05 AM  SpO2 97 % 07/06/2018 11:05 AM  Vitals shown include unvalidated device data.  Last Pain:  Vitals:   07/06/18 0635  TempSrc: Oral  PainSc: 3          Complications: No apparent anesthesia complications

## 2018-07-06 NOTE — Evaluation (Signed)
Physical Therapy Evaluation Patient Details Name: Regina Blanchard MRN: [id number] DOB: [birth year] Today's Date: 07/06/2018   History of Present Illness  Pt is a 68 yo female diagnosed with degenerative arthrosis of the R knee and is s/p elective R TKA.  PMH includes breast CA, back pain, and HTN.     Clinical Impression  Pt presents with deficits in strength, transfers, mobility, gait, R knee ROM, and activity tolerance but overall performed very well during the session especially considering POD#0 status.  Pt did not require physical assistance with sup to/from sit and was CGA only during sit to/from stand transfers.  Pt was able to take several steps at the EOB but ultimately was limited this session by nausea and vomiting, nursing notified.  Pt should progress well while in acute care once issues with nausea and vomiting are resolved and will benefit from HHPT services upon discharge to safely address above deficits for decreased caregiver assistance and eventual return to PLOF.        Follow Up Recommendations Home health PT;Supervision for mobility/OOB    Equipment Recommendations  None recommended by PT    Recommendations for Other Services       Precautions / Restrictions Precautions Precautions: Fall;Knee Required Braces or Orthoses: Other Brace Other Brace: Pt able to do Ind RLE SLRs without extensor lag, no KI required Restrictions Weight Bearing Restrictions: Yes RLE Weight Bearing: Weight bearing as tolerated      Mobility  Bed Mobility Overal bed mobility: Modified Independent             General bed mobility comments: Extra time and effort required along with the bed rail but no physical assistance needed  Transfers Overall transfer level: Needs assistance Equipment used: Rolling walker (2 wheeled) Transfers: Sit to/from Stand Sit to Stand: Min guard         General transfer comment: Min verbal cues for sequencing  Ambulation/Gait Ambulation/Gait  assistance: Min guard Gait Distance (Feet): 4 Feet Assistive device: Rolling walker (2 wheeled) Gait Pattern/deviations: Step-to pattern;Decreased stance time - right;Decreased step length - left;Antalgic Gait velocity: decreased   General Gait Details: Pt able to take several steps at the EOB with mod verbal cues for sequencing with heavy use of UEs on the RW during RLE stance phase  Stairs            Wheelchair Mobility    Modified Rankin (Stroke Patients Only)       Balance Overall balance assessment: No apparent balance deficits (not formally assessed)                                           Pertinent Vitals/Pain Pain Assessment: 0-10 Pain Score: 6  Pain Location: R knee Pain Descriptors / Indicators: Aching;Sore Pain Intervention(s): Premedicated before session;Monitored during session    [place] expects to be discharged to:: Private residence Living Arrangements: Spouse/significant other Available Help at Discharge: Family;Available 24 hours/day Type of Home: House Home Access: Stairs to enter Entrance Stairs-Rails: None Entrance Stairs-Number of Steps: 1 Home Layout: One level Home Equipment: Walker - 2 wheels;Walker - 4 wheels;Bedside commode;Shower seat      Prior Function Level of Independence: Independent         Comments: Pt Ind with amb without an AD limited community distances, Ind with ADLs, one fall in the last 6 months when tripped while carrying  items in both hands     Hand Dominance        Extremity/Trunk Assessment   Upper Extremity Assessment Upper Extremity Assessment: Overall WFL for tasks assessed    Lower Extremity Assessment Lower Extremity Assessment: Generalized weakness;RLE deficits/detail RLE Deficits / Details: R hip flex >/= 3/5 with pt able to perform 10 Ind SLRs without extensor lag; R ankle DF/PF 5/5 against manual resistance RLE: Unable to fully assess due to pain RLE  Sensation: WNL       Communication   Communication: No difficulties  Cognition Arousal/Alertness: Awake/alert Behavior During Therapy: WFL for tasks assessed/performed Overall Cognitive Status: Within Functional Limits for tasks assessed                                        General Comments      Exercises Total Joint Exercises Ankle Circles/Pumps: AROM;Strengthening;Both;10 reps Quad Sets: Strengthening;AROM;Right;10 reps Heel Slides: AROM;Left;5 reps Straight Leg Raises: AROM;Right;10 reps Long Arc Quad: AROM;Strengthening;Right;10 reps Knee Flexion: AROM;Strengthening;Right;10 reps Marching in Standing: AROM;Both;5 reps;Standing  R knee AROM: 13-68 deg limited by wrapping  Other Exercises: Positioning education with pt and family to encourage R knee ext PROM in bed/recliner   Assessment/Plan    PT Assessment Patient needs continued PT services  PT Problem List Decreased strength;Decreased range of motion       PT Treatment Interventions DME instruction;Gait training;Stair training;Functional mobility training;Therapeutic activities;Therapeutic exercise;Balance training;Patient/family education    PT Goals (Current goals can be found in the Care Plan section)  Acute Rehab PT Goals Patient Stated Goal: To get up and walk PT Goal Formulation: With patient Time For Goal Achievement: 07/19/18 Potential to Achieve Goals: Good    Frequency BID   Barriers to discharge        Co-evaluation               AM-PAC PT "6 Clicks" Mobility  Outcome Measure Help needed turning from your back to your side while in a flat bed without using bedrails?: A Little Help needed moving from lying on your back to sitting on the side of a flat bed without using bedrails?: A Little Help needed moving to and from a bed to a chair (including a wheelchair)?: A Little Help needed standing up from a chair using your arms (e.g., wheelchair or bedside chair)?: A  Little Help needed to walk in hospital room?: A Little Help needed climbing 3-5 steps with a railing? : A Lot 6 Click Score: 17    End of Session Equipment Utilized During Treatment: Gait belt;Oxygen Activity Tolerance: Other (comment)(Pt limited by nausea/vomiting) Patient left: in bed;with call bell/phone within reach;with bed alarm set;with SCD's reapplied;with family/visitor present;Other (comment)(Polar care donned to R knee) Nurse Communication: Mobility status;Other (comment)(Pt's nausea/vomiting) PT Visit Diagnosis: Muscle weakness (generalized) (M62.81);Other abnormalities of gait and mobility (R26.89);Pain Pain - Right/Left: Right Pain - part of body: Knee    Time: 5188-4166 PT Time Calculation (min) (ACUTE ONLY): 36 min   Charges:   PT Evaluation $PT Eval Low Complexity: 1 Low PT Treatments $Therapeutic Exercise: 8-22 mins        D. Royetta Asal PT, DPT 07/06/18, 4:24 PM

## 2018-07-06 NOTE — H&P (Signed)
The patient has been re-examined, and the chart reviewed, and there have been no interval changes to the documented history and physical.    The risks, benefits, and alternatives have been discussed at length. The patient expressed understanding of the risks benefits and agreed with plans for surgical intervention.  Webb Weed P. Syrena Burges, Jr. M.D.    

## 2018-07-07 MED ORDER — BISACODYL 5 MG PO TBEC
5.0000 mg | DELAYED_RELEASE_TABLET | Freq: Every day | ORAL | Status: DC | PRN
  Administered 2018-07-07: 5 mg via ORAL
  Filled 2018-07-07: qty 1

## 2018-07-07 NOTE — Progress Notes (Signed)
  Subjective: 1 Day Post-Op Procedure(s) (LRB): COMPUTER ASSISTED TOTAL KNEE ARTHROPLASTY (Right) Patient reports pain as mild.   Patient seen in rounds with Dr. Marry Guan. Patient is well, and has had no acute complaints or problems Plan is to go Home after hospital stay. Negative for chest pain and shortness of breath Fever: no Gastrointestinal: Negative for nausea and vomiting  Objective: Vital signs in last 24 hours: Temp:  [97.4 F (36.3 C)-98.7 F (37.1 C)] 97.4 F (36.3 C) (02/13 0457) Pulse Rate:  [66-99] 69 (02/13 0457) Resp:  [14-19] 19 (02/13 0457) BP: (111-145)/(55-79) 127/68 (02/13 0457) SpO2:  [96 %-100 %] 100 % (02/13 0457) Weight:  [98 kg] 98 kg (02/12 0635)  Intake/Output from previous day:  Intake/Output Summary (Last 24 hours) at 07/07/2018 0600 Last data filed at 07/07/2018 0500 Gross per 24 hour  Intake 3869.58 ml  Output 1730 ml  Net 2139.58 ml    Intake/Output this shift: Total I/O In: 1739.2 [P.O.:120; I.V.:1219.2; IV Piggyback:400] Out: 360 [Urine:350; Drains:10]  Labs: No results for input(s): HGB in the last 72 hours. No results for input(s): WBC, RBC, HCT, PLT in the last 72 hours. No results for input(s): NA, K, CL, CO2, BUN, CREATININE, GLUCOSE, CALCIUM in the last 72 hours. No results for input(s): LABPT, INR in the last 72 hours.   EXAM General - Patient is Alert and Oriented Extremity - Neurovascular intact Sensation intact distally Compartment soft Dressing/Incision - clean, dry, with the Hemovac intact Motor Function - intact, moving foot and toes well on exam.  Able to straight leg raise independently  Past Medical History:  Diagnosis Date  . Back pain   . Breast cancer ([place])    right breast cancer  . Cancer ([place]) right   positive 2004  . Cervical polyp   . Degenerative arthritis of right knee   . Diverticulitis   . Family history of adverse reaction to anesthesia    brother became combative in post op  . Pneumonia    as  a child "drank kerosine when a baby"  . PONV (postoperative nausea and vomiting)   . Status post radiation therapy    2004 breast cancer  . Vitamin D deficiency     Assessment/Plan: 1 Day Post-Op Procedure(s) (LRB): COMPUTER ASSISTED TOTAL KNEE ARTHROPLASTY (Right) Active Problems:   Total knee replacement status  Estimated body mass index is 32.84 kg/m as calculated from the following:   Height as of this encounter: 5\' 8"  (1.727 m).   Weight as of this encounter: 98 kg. Advance diet Up with therapy D/C IV fluids Discharge home with home health tomorrow  DVT Prophylaxis - Lovenox, Foot Pumps and TED hose Weight-Bearing as tolerated to right leg  Reche Dixon, PA-C Orthopaedic Surgery 07/07/2018, 6:00 AM

## 2018-07-07 NOTE — Care Management Note (Signed)
Case Management Note  Patient Details  Name: Regina Blanchard MRN: [id number] Date of Birth: 07/31/1950  Subjective/Objective:                  Met with patient to discuss DC plan and needs She lives with her husband at home he is retired and will home to help, her sister and daughter will also help as needed Husband will provide transportation [name] drives at baseline [facility] list provided to the patient per [email or web address] and she chooses [facility],  Patient has a RW at home [facility] in [place] is the pharmacy of choice for this patient PCP is Sabra Heck Patient can afford medication  CM to check the price of Lovenox prior to DC Has a riser on the toilet at home as well as a shower seat   Action/Plan:  [facility] list Provided to the patient and she has chosen [facility]. Notified teresa of the choice  Expected Discharge Date:  07/08/18               Expected Discharge Plan:     In-House Referral:     Discharge planning Services  CM Consult  Post Acute Care Choice:    Choice offered to:     DME Arranged:    DME Agency:     HH Arranged:  PT HH Agency:  [facility] (formerly [employer])  Status of Service:  In process, will continue to follow  If discussed at Long Length of Stay Meetings, dates discussed:    Additional Comments:  Su Hilt, RN 07/07/2018, 11:47 AM

## 2018-07-07 NOTE — Progress Notes (Signed)
Physical Therapy Treatment Patient Details Name: Regina Blanchard MRN: [id number] DOB: 05-Jul-1950 Today's Date: 07/07/2018    History of Present Illness Pt is a 68 yo female diagnosed with degenerative arthrosis of the R knee and is s/p elective R TKA.  PMH includes breast CA, back pain, and HTN.     PT Comments    Pt presents with deficits in strength, transfers, mobility, gait, R knee ROM, and activity tolerance but continues to make very good progress towards goals.  Pt was Mod Ind with bed mobility tasks and steady with SBA during sit to/from stand transfers.  Pt was able to amb 150' with a RW and SBA/CGA with improving cadence and gait quality.  Pt was steady ascending and descending steps with B rails per below with good eccentric and concentric control.  Pt will benefit next session from practice ascending/descending one step with a RW to simulate home entry/exit.  Pt will benefit from HHPT services upon discharge to safely address above deficits for decreased caregiver assistance and eventual return to PLOF.     Follow Up Recommendations  Home health PT;Supervision for mobility/OOB     Equipment Recommendations  None recommended by PT    Recommendations for Other Services       Precautions / Restrictions Precautions Precautions: Fall;Knee Required Braces or Orthoses: Other Brace Other Brace: Pt able to do Ind RLE SLRs without extensor lag, no KI required Restrictions Weight Bearing Restrictions: Yes RLE Weight Bearing: Weight bearing as tolerated    Mobility  Bed Mobility Overal bed mobility: Modified Independent             General bed mobility comments: Extra time and effort but no physical assistance required  Transfers Overall transfer level: Needs assistance Equipment used: Rolling walker (2 wheeled) Transfers: Sit to/from Stand Sit to Stand: Supervision         General transfer comment: Min verbal cues for  sequencing  Ambulation/Gait Ambulation/Gait assistance: Supervision;Min guard Gait Distance (Feet): 150 Feet Assistive device: Rolling walker (2 wheeled) Gait Pattern/deviations: Step-through pattern;Decreased stance time - right;Decreased step length - left Gait velocity: decreased   General Gait Details: Improved reciprocal gait pattern and RLE stance time with good stability and min cues for general sequencing with the RW   Stairs Stairs: Yes Stairs assistance: Min guard Stair Management: Two rails Number of Stairs: 4 General stair comments: Ascend/descend 1 step x 2 and then 2 steps x 1 with min verbal cues for sequencing with good control and stability throughout.    Wheelchair Mobility    Modified Rankin (Stroke Patients Only)       Balance Overall balance assessment: No apparent balance deficits (not formally assessed)                                          Cognition Arousal/Alertness: Awake/alert Behavior During Therapy: WFL for tasks assessed/performed Overall Cognitive Status: Within Functional Limits for tasks assessed                                        Exercises Total Joint Exercises Ankle Circles/Pumps: AROM;Strengthening;Both;10 reps Quad Sets: Strengthening;AROM;Right;10 reps Long Arc Quad: AROM;Strengthening;10 reps;15 reps;Right Knee Flexion: AROM;Strengthening;10 reps;Right;15 reps Marching in Standing: AROM;Both;Standing;10 reps Other Exercises Other Exercises: 90 deg right turn training to prevent  CKC twisting on the R knee Other Exercises: HEP education/review per handout    General Comments        Pertinent Vitals/Pain Pain Assessment: 0-10 Pain Score: 1  Pain Location: R knee  Pain Descriptors / Indicators: Sore Pain Intervention(s): Premedicated before session;Monitored during session    Home Living                      Prior Function            PT Goals (current goals can now  be found in the care plan section) Progress towards PT goals: Progressing toward goals    Frequency    BID      PT Plan Current plan remains appropriate    Co-evaluation              AM-PAC PT "6 Clicks" Mobility   Outcome Measure  Help needed turning from your back to your side while in a flat bed without using bedrails?: A Little Help needed moving from lying on your back to sitting on the side of a flat bed without using bedrails?: A Little Help needed moving to and from a bed to a chair (including a wheelchair)?: A Little Help needed standing up from a chair using your arms (e.g., wheelchair or bedside chair)?: A Little Help needed to walk in hospital room?: A Little Help needed climbing 3-5 steps with a railing? : A Little 6 Click Score: 18    End of Session Equipment Utilized During Treatment: Gait belt Activity Tolerance: Patient tolerated treatment well Patient left: in bed;with bed alarm set;with call bell/phone within reach;with SCD's reapplied;Other (comment)(Polar care to R knee) Nurse Communication: Mobility status PT Visit Diagnosis: Muscle weakness (generalized) (M62.81);Other abnormalities of gait and mobility (R26.89);Pain Pain - Right/Left: Right Pain - part of body: Knee     Time: 1411-1440 PT Time Calculation (min) (ACUTE ONLY): 29 min  Charges:  $Gait Training: 8-22 mins $Therapeutic Exercise: 8-22 mins                     D. Scott Peyton Spengler PT, DPT 07/07/18, 4:25 PM

## 2018-07-07 NOTE — Anesthesia Postprocedure Evaluation (Signed)
Anesthesia Post Note  Patient: Regina Blanchard  Procedure(s) Performed: COMPUTER ASSISTED TOTAL KNEE ARTHROPLASTY (Right )  Patient location during evaluation: Nursing Unit Anesthesia Type: Spinal Level of consciousness: oriented and awake and alert Pain management: pain level controlled Vital Signs Assessment: post-procedure vital signs reviewed and stable Respiratory status: spontaneous breathing and respiratory function stable Cardiovascular status: blood pressure returned to baseline and stable Postop Assessment: no headache, no backache, no apparent nausea or vomiting and patient able to bend at knees Anesthetic complications: no     Last Vitals:  Vitals:   07/07/18 0457 07/07/18 0718  BP: 127/68 130/65  Pulse: 69 68  Resp: 19 15  Temp: (!) 36.3 C (!) 36.4 C  SpO2: 100% 100%    Last Pain:  Vitals:   07/07/18 0745  TempSrc:   PainSc: 2                  Brantley Fling

## 2018-07-07 NOTE — Progress Notes (Signed)
Physical Therapy Treatment Patient Details Name: Regina Blanchard MRN: [id number] DOB: 09/10/1950 Today's Date: 07/07/2018    History of Present Illness Pt is a 68 yo female diagnosed with degenerative arthrosis of the R knee and is s/p elective R TKA.  PMH includes breast CA, back pain, and HTN.     PT Comments    Pt presents with deficits in strength, transfers, mobility, gait, R knee ROM, and activity tolerance but is making very good progress towards goals.  Pt demonstrated good control and stability with sit to/from stand transfers to various surfaces.  Pt was able to amb 80 feet with a RW and CGA with step-to pattern slowly progressing towards step-through pattern with cues for sequencing.  Pt will benefit from HHPT services upon discharge to safely address above deficits for decreased caregiver assistance and eventual return to PLOF.    Follow Up Recommendations  Home health PT;Supervision for mobility/OOB     Equipment Recommendations  None recommended by PT    Recommendations for Other Services       Precautions / Restrictions Precautions Precautions: Fall;Knee Required Braces or Orthoses: Other Brace Other Brace: Pt able to do Ind RLE SLRs without extensor lag, no KI required Restrictions Weight Bearing Restrictions: Yes RLE Weight Bearing: Weight bearing as tolerated    Mobility  Bed Mobility Overal bed mobility: Modified Independent             General bed mobility comments: NT, pt in recliner  Transfers Overall transfer level: Needs assistance Equipment used: Rolling walker (2 wheeled) Transfers: Sit to/from Stand Sit to Stand: Min guard         General transfer comment: Min verbal cues for sequencing  Ambulation/Gait Ambulation/Gait assistance: Min guard Gait Distance (Feet): 80 Feet Assistive device: Rolling walker (2 wheeled) Gait Pattern/deviations: Step-to pattern;Step-through pattern;Decreased step length - left;Decreased stance time -  right Gait velocity: decreased   General Gait Details: Step-to pattern slow progressed to beginning step-through pattern cues for sequencing   Stairs             Wheelchair Mobility    Modified Rankin (Stroke Patients Only)       Balance Overall balance assessment: No apparent balance deficits (not formally assessed)                                          Cognition Arousal/Alertness: Awake/alert Behavior During Therapy: WFL for tasks assessed/performed Overall Cognitive Status: Within Functional Limits for tasks assessed                                        Exercises Total Joint Exercises Ankle Circles/Pumps: AROM;Strengthening;Both;10 reps Quad Sets: Strengthening;AROM;Right;10 reps Long Arc Quad: AROM;Strengthening;Both;10 reps Knee Flexion: AROM;Strengthening;10 reps;Both Marching in Standing: AROM;Both;Standing;10 reps  R knee AROM: 10-70 deg  Other Exercises: Sit to/from stand transfer training from various height surfaces with cues for sequencing. Other Exercises: 90 deg right turn training to prevent CKC twisting on the R knee Other Exercises: pt/family instructed in polar care mgt and compression stocking mgt and instructed in use of AE/DME for ADL tasks to improve independence and safety Other Exercises: pt/family educated in home/routines modifications to improve bathroom safety/access and educated in use of a toileting schedule and other strategies to optimize bladder mgt during recovery,  as pt reports some difficulty with urgency     General Comments        Pertinent Vitals/Pain Pain Assessment: 0-10 Pain Score: 2  Pain Location: R knee with amb, 0/10 at rest Pain Descriptors / Indicators: Sore Pain Intervention(s): Monitored during session    Home Living Family/patient expects to be discharged to:: Private residence Living Arrangements: Spouse/significant other Available Help at Discharge: Family;Available  24 hours/day Type of Home: House Home Access: Stairs to enter Entrance Stairs-Rails: None Home Layout: One level Home Equipment: Environmental consultant - 2 wheels;Walker - 4 wheels;Bedside commode;Shower seat;Toilet riser      Prior Function Level of Independence: Independent      Comments: Pt Ind with amb without an AD limited community distances, Ind with ADL, one fall in the last 6 months when tripped while carrying items in both hands. Had difficulty standing for long periods of time 2/2 R knee pain. Took seated showers   PT Goals (current goals can now be found in the care plan section) Acute Rehab PT Goals Patient Stated Goal: return home and return to PLOF with improved safety/independence and less R knee pain Progress towards PT goals: Progressing toward goals    Frequency    BID      PT Plan Current plan remains appropriate    Co-evaluation              AM-PAC PT "6 Clicks" Mobility   Outcome Measure  Help needed turning from your back to your side while in a flat bed without using bedrails?: A Little Help needed moving from lying on your back to sitting on the side of a flat bed without using bedrails?: A Little Help needed moving to and from a bed to a chair (including a wheelchair)?: A Little Help needed standing up from a chair using your arms (e.g., wheelchair or bedside chair)?: A Little Help needed to walk in hospital room?: A Little Help needed climbing 3-5 steps with a railing? : A Little 6 Click Score: 18    End of Session Equipment Utilized During Treatment: Gait belt Activity Tolerance: Patient tolerated treatment well Patient left: Other (comment)(Pt left in BR attempting BM with daughter present and nursing notified; pt educated to pull cord for assistance when ready) Nurse Communication: Mobility status PT Visit Diagnosis: Muscle weakness (generalized) (M62.81);Other abnormalities of gait and mobility (R26.89);Pain Pain - Right/Left: Right Pain - part of  body: Knee     Time: 1007-1030 PT Time Calculation (min) (ACUTE ONLY): 23 min  Charges:  $Gait Training: 8-22 mins $Therapeutic Exercise: 8-22 mins                     D. Scott Geniva Lohnes PT, DPT 07/07/18, 12:18 PM

## 2018-07-07 NOTE — Evaluation (Signed)
Occupational Therapy Evaluation Patient Details Name: Regina Blanchard MRN: [id number] DOB: [birth year] Today's Date: 07/07/2018    History of Present Illness Pt is a 68 yo female diagnosed with degenerative arthrosis of the R knee and is s/p elective R TKA.  PMH includes breast CA, back pain, and HTN.    Clinical Impression   Pt seen for OT evaluation this date, POD#1 from above surgery. Pt was independent in all ADL prior to surgery, however had difficulty with long periods of static standing 2/2 R>L knee pain. Pt has resorted to taking seated showers for this reason. Pt is eager to return to PLOF with less pain and improved safety and independence. Pt currently requires minimal assist for LB ADL while in seated position due to pain and limited AROM of R knee. Pt/family instructed in polar care mgt, falls prevention strategies including pet care considerations, home/routines modifications, DME/AE for LB bathing and dressing tasks, compression stocking mgt, and bladder mgt strategies. Pt/family verbalized understanding of all education/training provided. Pt benefited maximally from OT evaluation and treatment provided this date. Do not currently anticipate any additional skilled OT needs at this time. Will sign off. Please re-consult if additional needs arise.       Follow Up Recommendations  No OT follow up    Equipment Recommendations  Other (comment)(reacher)    Recommendations for Other Services       Precautions / Restrictions Precautions Precautions: Fall;Knee Required Braces or Orthoses: Other Brace Other Brace: Pt able to do Ind RLE SLRs without extensor lag, no KI required Restrictions Weight Bearing Restrictions: Yes RLE Weight Bearing: Weight bearing as tolerated      Mobility Bed Mobility Overal bed mobility: Modified Independent                Transfers Overall transfer level: Needs assistance Equipment used: Rolling walker (2 wheeled) Transfers: Sit to/from  Stand Sit to Stand: Min guard              Balance Overall balance assessment: No apparent balance deficits (not formally assessed)                                         ADL either performed or assessed with clinical judgement   ADL Overall ADL's : Needs assistance/impaired                                       General ADL Comments: MIN A for LB ADL tasks, family able to assist; CGA with RW for functional mobility during ADL and for functional transfers     Vision Baseline Vision/History: Wears glasses Wears Glasses: At all times Patient Visual Report: No change from baseline       Perception     Praxis      Pertinent Vitals/Pain Pain Assessment: 0-10 Pain Score: 3  Pain Location: R knee Pain Descriptors / Indicators: Aching;Sore Pain Intervention(s): Limited activity within patient's tolerance;Monitored during session;Repositioned;Ice applied     Hand Dominance     Extremity/Trunk Assessment Upper Extremity Assessment Upper Extremity Assessment: Overall WFL for tasks assessed   Lower Extremity Assessment Lower Extremity Assessment: RLE deficits/detail RLE Deficits / Details: expected post-op strength/ROM deficits   Cervical / Trunk Assessment Cervical / Trunk Assessment: Normal   Communication Communication Communication: No difficulties  Cognition Arousal/Alertness: Awake/alert Behavior During Therapy: WFL for tasks assessed/performed Overall Cognitive Status: Within Functional Limits for tasks assessed                                     General Comments       Exercises Other Exercises Other Exercises: pt/family educated in falls prevention strategies including pet care considerations Other Exercises: pt/family instructed in polar care mgt and compression stocking mgt and instructed in use of AE/DME for ADL tasks to improve independence and safety Other Exercises: pt/family educated in  home/routines modifications to improve bathroom safety/access and educated in use of a toileting schedule and other strategies to optimize bladder mgt during recovery, as pt reports some difficulty with urgency    Shoulder Instructions      Home Living Family/patient expects to be discharged to:: Private residence Living Arrangements: Spouse/significant other Available Help at Discharge: Family;Available 24 hours/day Type of Home: House Home Access: Stairs to enter [employer] of Steps: 1 Entrance Stairs-Rails: None Home Layout: One level     Bathroom Shower/Tub: Occupational psychologist: Handicapped height     Home Equipment: Environmental consultant - 2 wheels;Walker - 4 wheels;Bedside commode;Shower seat;Toilet riser          Prior Functioning/Environment Level of Independence: Independent        Comments: Pt Ind with amb without an AD limited community distances, Ind with ADL, one fall in the last 6 months when tripped while carrying items in both hands. Had difficulty standing for long periods of time 2/2 R knee pain. Took seated showers        OT Problem List: Decreased strength;Decreased range of motion;Pain      OT Treatment/Interventions:      OT Goals(Current goals can be found in the care plan section) Acute Rehab OT Goals Patient Stated Goal: return home and return to PLOF with improved safety/independence and less R knee pain OT Goal Formulation: All assessment and education complete, DC therapy  OT Frequency:     Barriers to D/C:            Co-evaluation              AM-PAC OT "6 Clicks" Daily Activity     Outcome Measure Help from another person eating meals?: None Help from another person taking care of personal grooming?: None Help from another person toileting, which includes using toliet, bedpan, or urinal?: A Little Help from another person bathing (including washing, rinsing, drying)?: A Little Help from another person to put on and  taking off regular upper body clothing?: None Help from another person to put on and taking off regular lower body clothing?: A Little 6 Click Score: 21   End of Session Equipment Utilized During Treatment: Gait belt;Rolling walker Nurse Communication: Other (comment)(pt off 1L O2 and now on room air with O2 sats >96%)  Activity Tolerance: Patient tolerated treatment well Patient left: in chair;with call bell/phone within reach;with chair alarm set;with family/visitor present;Other (comment)(polar care in place)  OT Visit Diagnosis: Other abnormalities of gait and mobility (R26.89);Pain Pain - Right/Left: Right Pain - part of body: Knee                Time: 2025-4270 OT Time Calculation (min): 31 min Charges:  OT General Charges $OT Visit: 1 Visit OT Evaluation $OT Eval Low Complexity: 1 Low OT Treatments $Self Care/Home Management :  23-37 mins  Jeni Salles, MPH, MS, OTR/L ascom [phone] 07/07/18, 9:21 AM

## 2018-07-07 NOTE — NC FL2 (Signed)
[place] LEVEL OF CARE SCREENING TOOL     IDENTIFICATION  Patient Name: Regina Blanchard Birthdate: 1951/04/05 Sex: female Admission Date (Current Location): 07/06/2018  [place] and [place] Number:  Engineering geologist and Address:  [facility], [address], [place], [place] [address]      Provider Number: [id number]  Attending Physician Name and Address:  Dereck Leep, MD  Relative Name and Phone Number:       Current Level of Care: Hospital Recommended Level of Care: [place] Prior Approval Number:    Date Approved/Denied:   PASRR Number: ([id number])  Discharge Plan: SNF    Current Diagnoses: Patient Active Problem List   Diagnosis Date Noted  . Total knee replacement status 07/06/2018  . Primary osteoarthritis of right knee 04/10/2018  . Hyperlipidemia, mixed 01/21/2018  . Carcinoma in situ of right breast 01/27/2017  . Fibrocystic breast disease 01/27/2017  . Vitamin D deficiency, unspecified 01/08/2014    Orientation RESPIRATION BLADDER Height & Weight     Self, Time, Situation, Place  Normal Continent Weight: 216 lb (98 kg) Height:  5\' 8"  (172.7 cm)  BEHAVIORAL SYMPTOMS/MOOD NEUROLOGICAL BOWEL NUTRITION STATUS      Continent Diet(Diet: Regular )  AMBULATORY STATUS COMMUNICATION OF NEEDS Skin   Extensive Assist Verbally Surgical wounds(Incision: Right Knee. )                       Personal Care Assistance Level of Assistance  Bathing, Feeding, Dressing Bathing Assistance: Limited assistance Feeding assistance: Independent Dressing Assistance: Limited assistance     Functional Limitations Info  Sight, Hearing, Speech Sight Info: Impaired Hearing Info: Adequate Speech Info: Adequate    SPECIAL CARE FACTORS FREQUENCY  PT (By licensed PT), OT (By licensed OT)     PT Frequency: (5) OT Frequency: (5)            Contractures      Additional Factors Info  Code Status,  Allergies Code Status Info: (Full Code. ) Allergies Info: (No Known Allergies. )           Current Medications (07/07/2018):  This is the current hospital active medication list Current Facility-Administered Medications  Medication Dose Route Frequency Provider Last Rate Last Dose  . 0.9 %  sodium chloride infusion   Intravenous Continuous Hooten, Laurice Record, MD 100 mL/hr at 07/07/18 0715    . acetaminophen (TYLENOL) tablet 325-650 mg  325-650 mg Oral Q6H PRN Hooten, Laurice Record, MD      . ALPRAZolam Duanne Moron) tablet 0.25 mg  0.25 mg Oral Daily PRN Hooten, Laurice Record, MD      . alum & mag hydroxide-simeth (MAALOX/MYLANTA) 200-200-20 MG/5ML suspension 30 mL  30 mL Oral Q4H PRN Hooten, Laurice Record, MD      . baclofen (LIORESAL) tablet 10 mg  10 mg Oral Daily PRN Hooten, Laurice Record, MD      . bisacodyl (DULCOLAX) suppository 10 mg  10 mg Rectal Daily PRN Hooten, Laurice Record, MD      . celecoxib (CELEBREX) capsule 200 mg  200 mg Oral BID Dereck Leep, MD   200 mg at 07/07/18 0902  . cholecalciferol (VITAMIN D3) tablet 5,000 Units  5,000 Units Oral Daily Hooten, Laurice Record, MD   5,000 Units at 07/07/18 0901  . diphenhydrAMINE (BENADRYL) 12.5 MG/5ML elixir 12.5-25 mg  12.5-25 mg Oral Q4H PRN Hooten, Laurice Record, MD      . enoxaparin (  LOVENOX) injection 30 mg  30 mg Subcutaneous Q12H Hooten, Laurice Record, MD   30 mg at 07/07/18 0731  . ferrous sulfate tablet 325 mg  325 mg Oral BID WC Hooten, Laurice Record, MD   325 mg at 07/07/18 0730  . gabapentin (NEURONTIN) capsule 300 mg  300 mg Oral QHS Hooten, Laurice Record, MD   300 mg at 07/06/18 2133  . hydrochlorothiazide (HYDRODIURIL) tablet 12.5-25 mg  12.5-25 mg Oral Daily PRN Hooten, Laurice Record, MD      . HYDROmorphone (DILAUDID) injection 0.5-1 mg  0.5-1 mg Intravenous Q4H PRN Hooten, Laurice Record, MD   1 mg at 07/06/18 1212  . magnesium hydroxide (MILK OF MAGNESIA) suspension 30 mL  30 mL Oral Daily Hooten, Laurice Record, MD      . menthol-cetylpyridinium (CEPACOL) lozenge 3 mg  1 lozenge Oral PRN Hooten,  Laurice Record, MD       Or  . phenol (CHLORASEPTIC) mouth spray 1 spray  1 spray Mouth/Throat PRN Hooten, Laurice Record, MD      . metoCLOPramide (REGLAN) tablet 5-10 mg  5-10 mg Oral Q8H PRN Hooten, Laurice Record, MD       Or  . metoCLOPramide (REGLAN) injection 5-10 mg  5-10 mg Intravenous Q8H PRN Hooten, Laurice Record, MD      . metoCLOPramide (REGLAN) tablet 10 mg  10 mg Oral TID AC & HS Hooten, Laurice Record, MD   10 mg at 07/07/18 0730  . ondansetron (ZOFRAN) tablet 4 mg  4 mg Oral Q6H PRN Hooten, Laurice Record, MD       Or  . ondansetron (ZOFRAN) injection 4 mg  4 mg Intravenous Q6H PRN Hooten, Laurice Record, MD   4 mg at 07/06/18 2132  . oxyCODONE (Oxy IR/ROXICODONE) immediate release tablet 10 mg  10 mg Oral Q4H PRN Hooten, Laurice Record, MD   10 mg at 07/06/18 1507  . oxyCODONE (Oxy IR/ROXICODONE) immediate release tablet 5 mg  5 mg Oral Q4H PRN Hooten, Laurice Record, MD   5 mg at 07/07/18 0902  . pantoprazole (PROTONIX) EC tablet 40 mg  40 mg Oral BID Dereck Leep, MD   40 mg at 07/07/18 0902  . senna-docusate (Senokot-S) tablet 1 tablet  1 tablet Oral BID Dereck Leep, MD   1 tablet at 07/07/18 0902  . sodium phosphate (FLEET) 7-19 GM/118ML enema 1 enema  1 enema Rectal Once PRN Hooten, Laurice Record, MD      . traMADol Veatrice Bourbon) tablet 50-100 mg  50-100 mg Oral Q4H PRN Dereck Leep, MD   100 mg at 07/07/18 0601     Discharge Medications: Please see discharge summary for a list of discharge medications.  Relevant Imaging Results:  Relevant Lab Results:   Additional Information (SSN: [id number])  Trever Streater, Veronia Beets, LCSW

## 2018-07-07 NOTE — Progress Notes (Signed)
Clinical Education officer, museum (CSW) received SNF consult. PT is recommending home health. RN case manager aware of above. Please recosnult if future social work needs arise. CSW signing off.   [employer], LCSW [phone]

## 2018-07-08 MED ORDER — TRAMADOL HCL 50 MG PO TABS: 50.0000 mg | ORAL_TABLET | ORAL | 1 refills | Status: DC | PRN

## 2018-07-08 MED ORDER — OXYCODONE HCL 5 MG PO TABS: 5.0000 mg | ORAL_TABLET | ORAL | 0 refills | Status: DC | PRN

## 2018-07-08 MED ORDER — ENOXAPARIN SODIUM 40 MG/0.4ML ~~LOC~~ SOLN: 40.0000 mg | SUBCUTANEOUS | 0 refills | Status: DC

## 2018-07-08 MED ORDER — CELECOXIB 200 MG PO CAPS: 200.0000 mg | ORAL_CAPSULE | Freq: Two times a day (BID) | ORAL | 1 refills | Status: DC

## 2018-07-08 NOTE — Progress Notes (Signed)
Physical Therapy Treatment Patient Details Name: Regina Blanchard MRN: [id number] DOB: 11/07/1950 Today's Date: 07/08/2018    History of Present Illness Pt is a 68 yo female diagnosed with degenerative arthrosis of the R knee and is s/p elective R TKA.  PMH includes breast CA, back pain, and HTN.     PT Comments    Patient agreeable to PT, reported 5/10 R knee pain, premedicated prior to session. Patient able to perform therapeutic exercises without physical assist, verbal cues for form, no lag noted with R SLR, improved quality with verbal cues for quad set prior to lift. Patient able to transfer to EOB mod I, sit <> stand with supervision multiple times, and ambulated ~162ft with RW supervision/CGA. Patient verbally cued for TKE on RLE as well as knee flexion in swing phase with good carry over. Performed stair navigation with RW with good balance, safety, and proper sequencing with demonstration and minimal verbal cues, supervision/CGA. The patient would benefit from further skilled PT to continue to progress patient to return to PLOF.      Follow Up Recommendations  Home health PT     Equipment Recommendations  None recommended by PT    Recommendations for Other Services       Precautions / Restrictions Precautions Precautions: Fall;Knee Other Brace: Pt able to do Ind RLE SLRs without extensor lag, no KI required Restrictions Weight Bearing Restrictions: Yes RLE Weight Bearing: Weight bearing as tolerated    Mobility  Bed Mobility Overal bed mobility: Modified Independent             General bed mobility comments: Extra time and effort but no physical assistance required  Transfers Overall transfer level: Needs assistance Equipment used: Rolling walker (2 wheeled) Transfers: Sit to/from Stand Sit to Stand: Supervision         General transfer comment: pt demonstrated proper sequencing  Ambulation/Gait Ambulation/Gait assistance: Supervision;Min guard Gait  Distance (Feet): 170 Feet Assistive device: Rolling walker (2 wheeled) Gait Pattern/deviations: Step-through pattern;Decreased stance time - right;Decreased step length - left     General Gait Details: cues for TKE during stance, patient with improved step length this AM though still limited   Stairs Stairs: Yes Stairs assistance: Min guard Stair Management: No rails;Backwards;With walker Number of Stairs: 1 General stair comments: ascend/descend 1 step with RW and supervision/CGA. Safe, steady, correct sequencing of movements   Wheelchair Mobility    Modified Rankin (Stroke Patients Only)       Balance Overall balance assessment: No apparent balance deficits (not formally assessed)                                          Cognition Arousal/Alertness: Awake/alert Behavior During Therapy: WFL for tasks assessed/performed Overall Cognitive Status: Within Functional Limits for tasks assessed                                        Exercises Total Joint Exercises Quad Sets: Strengthening;AROM;Right;10 reps Heel Slides: 10 reps;Right;AROM Hip ABduction/ADduction: AROM;Right;10 reps Straight Leg Raises: AROM;Right;10 reps Knee Flexion: AROM;Strengthening;Right;10 reps Goniometric ROM: -1deg from neutral, 78deg flexion    General Comments        Pertinent Vitals/Pain Pain Assessment: 0-10 Pain Score: 5  Pain Location: R knee  Pain Descriptors / Indicators: Aching;Sore;Spasm Pain  Intervention(s): Limited activity within patient's tolerance;Monitored during session;Repositioned;Premedicated before session;Ice applied    Home Living                      Prior Function            PT Goals (current goals can now be found in the care plan section) Progress towards PT goals: Progressing toward goals    Frequency    BID      PT Plan Current plan remains appropriate    Co-evaluation              AM-PAC PT "6  Clicks" Mobility   Outcome Measure  Help needed turning from your back to your side while in a flat bed without using bedrails?: A Little Help needed moving from lying on your back to sitting on the side of a flat bed without using bedrails?: A Little Help needed moving to and from a bed to a chair (including a wheelchair)?: A Little Help needed standing up from a chair using your arms (e.g., wheelchair or bedside chair)?: A Little Help needed to walk in hospital room?: A Little Help needed climbing 3-5 steps with a railing? : A Little 6 Click Score: 18    End of Session Equipment Utilized During Treatment: Gait belt Activity Tolerance: Patient tolerated treatment well Patient left: with chair alarm set;in chair;with family/visitor present;with SCD's reapplied;with call bell/phone within reach Nurse Communication: Mobility status PT Visit Diagnosis: Muscle weakness (generalized) (M62.81);Other abnormalities of gait and mobility (R26.89);Pain Pain - Right/Left: Right Pain - part of body: Knee     Time: 2248-2500 PT Time Calculation (min) (ACUTE ONLY): 31 min  Charges:  $Therapeutic Exercise: 8-22 mins $Therapeutic Activity: 8-22 mins                     Lieutenant Diego PT, DPT 10:10 AM,07/08/18 [phone]

## 2018-07-08 NOTE — Progress Notes (Signed)
Changed dressing and applied TED

## 2018-07-08 NOTE — Care Management (Signed)
Called in verbal order for Lovenox 40 Mg once daily X 14 days per Dr. Clydell Hakim order.  To [facility] in [place] the price for the Lovenox is $10.00, notified the patient of the cost.

## 2018-07-08 NOTE — Care Management Important Message (Signed)
Important Message  Patient Details  Name: Regina Blanchard MRN: [id number] Date of Birth: 1951/02/17   Medicare Important Message Given:  Yes    Juliann Pulse A Wayman Hoard 07/08/2018, 10:15 AM

## 2018-07-08 NOTE — Progress Notes (Signed)
Discharge summary reviewed with verbal understanding. No questions at this time. Belongings given at discharge.

## 2018-07-08 NOTE — Discharge Summary (Signed)
Physician Discharge Summary  Subjective: 2 Days Post-Op Procedure(s) (LRB): COMPUTER ASSISTED TOTAL KNEE ARTHROPLASTY (Right) Patient reports pain as moderate.   Patient seen in rounds with Dr. Marry Guan. Patient is well, and has had no acute complaints or problems Patient is ready to go home with home health physical therapy.  Physician Discharge Summary  Patient ID: Regina Blanchard MRN: [id number] DOB/AGE: 1950/11/04 68 y.o.  Admit date: 07/06/2018 Discharge date: 07/08/2018  Admission Diagnoses:  Discharge Diagnoses:  Active Problems:   Total knee replacement status   Discharged Condition: good  Hospital Course: Patient is postop day 2 from a right total knee replacement.  She has done well since surgery.  Her vitals have remained stable with slightly low blood pressure.  The patient is ambulating 150 feet and has done stairs with physical therapy.  She can independently do a straight leg raise.  The patient had her Hemovac removed today.  The patient has to have a bowel movement before discharge today.  Treatments: surgery:   Right total knee arthroplasty using computer-assisted navigation  SURGEON:  Marciano Sequin. M.D.  ASSISTANT: Melissa Noon, PA-C (present and scrubbed throughout the case, critical for assistance with exposure, retraction, instrumentation, and closure)  ANESTHESIA: spinal  ESTIMATED BLOOD LOSS: 50 mL  FLUIDS REPLACED: 1300 mL of crystalloid  TOURNIQUET TIME: 101 minutes  DRAINS: 2 medium Hemovac drains  SOFT TISSUE RELEASES: Anterior cruciate ligament, posterior cruciate ligament, deep and superficial medial collateral ligament, patellofemoral ligament  IMPLANTS UTILIZED: DePuy Attune size 6N posterior stabilized femoral component (cemented), size 6 rotating platform tibial component (cemented), 38 mm medialized dome patella (cemented), and a 5 mm stabilized rotating platform polyethylene insert.  Discharge Exam: Blood pressure  (!) 105/54, pulse 80, temperature 98 F (36.7 C), temperature source Oral, resp. rate 19, height 5\' 8"  (1.727 m), weight 98 kg, SpO2 94 %.   Disposition: Discharge disposition: 01-Home or Self Care        Allergies as of 07/08/2018   No Known Allergies     Medication List    STOP taking these medications   aspirin EC 81 MG tablet   ibuprofen 200 MG tablet Commonly known as:  ADVIL,MOTRIN     TAKE these medications   ALPRAZolam 0.25 MG tablet Commonly known as:  XANAX Take 0.25 mg by mouth daily as needed for anxiety or sleep.   baclofen 10 MG tablet Commonly known as:  LIORESAL Take 10 mg by mouth daily as needed for muscle spasms.   celecoxib 200 MG capsule Commonly known as:  CELEBREX Take 1 capsule (200 mg total) by mouth 2 (two) times daily.   DIALYVITE VITAMIN D 5000 125 MCG (5000 UT) capsule Generic drug:  Cholecalciferol Take 5,000 Units by mouth daily.   enoxaparin 40 MG/0.4ML injection Commonly known as:  LOVENOX Inject 0.4 mLs (40 mg total) into the skin daily.   hydrochlorothiazide 25 MG tablet Commonly known as:  HYDRODIURIL Take 12.5-25 mg by mouth daily as needed (high bp). If systolic bp is over 283 take either 12.5 or 25 mg, depending on the activity for the day.   methocarbamol 750 MG tablet Commonly known as:  ROBAXIN Take 750 mg by mouth daily as needed for muscle spasms.   nitrofurantoin (macrocrystal-monohydrate) 100 MG capsule Commonly known as:  MACROBID Take one capsule after intercourse to prevent UTI   oxyCODONE 5 MG immediate release tablet Commonly known as:  Oxy IR/ROXICODONE Take 1 tablet (5 mg total) by mouth every 4 (  four) hours as needed for moderate pain (pain score 4-6).   traMADol 50 MG tablet Commonly known as:  ULTRAM Take 1-2 tablets (50-100 mg total) by mouth every 4 (four) hours as needed for moderate pain.   trolamine salicylate 10 % cream Commonly known as:  ASPERCREME Apply 1 application topically as needed  (knee pain).            Durable Medical Equipment  (From admission, onward)         Start     Ordered   07/06/18 1159  DME Walker rolling  Once    Question:  Patient needs a walker to treat with the following condition  Answer:  Total knee replacement status   07/06/18 1158   07/06/18 1159  DME Bedside commode  Once    Question:  Patient needs a bedside commode to treat with the following condition  Answer:  Total knee replacement status   07/06/18 1158         Follow-up Information    Watt Climes, PA On 07/20/2018.   Specialty:  Physician Assistant Why:  at 10:15am Contact information: [place] [address] [phone]        Dereck Leep, MD On 08/18/2018.   Specialty:  Orthopedic Surgery Why:  at 1:45pm Contact information: [place] [address] [phone]           Signed: Prescott Parma, Everest Hacking 07/08/2018, 6:57 AM   Objective: Vital signs in last 24 hours: Temp:  [97.4 F (36.3 C)-98 F (36.7 C)] 98 F (36.7 C) (02/13 2359) Pulse Rate:  [68-80] 80 (02/13 2359) Resp:  [15-19] 19 (02/13 2359) BP: (105-130)/(42-65) 105/54 (02/13 2359) SpO2:  [94 %-100 %] 94 % (02/13 2359)  Intake/Output from previous day:  Intake/Output Summary (Last 24 hours) at 07/08/2018 0657 Last data filed at 07/08/2018 0423 Gross per 24 hour  Intake 960 ml  Output 400 ml  Net 560 ml    Intake/Output this shift: Total I/O In: 240 [P.O.:240] Out: 0   Labs: No results for input(s): HGB in the last 72 hours. No results for input(s): WBC, RBC, HCT, PLT in the last 72 hours. No results for input(s): NA, K, CL, CO2, BUN, CREATININE, GLUCOSE, CALCIUM in the last 72 hours. No results for input(s): LABPT, INR in the last 72 hours.  EXAM: General - Patient is Alert and Oriented Extremity - Neurovascular intact Sensation intact distally Dorsiflexion/Plantar flexion intact Compartment soft Incision - clean, dry,  with Hemovac removed.  The Hemovac tubing appeared to be intact on removal. Motor Function -dorsiflexion and plantarflexion are intact.  Able to straight leg raise independently.  Assessment/Plan: 2 Days Post-Op Procedure(s) (LRB): COMPUTER ASSISTED TOTAL KNEE ARTHROPLASTY (Right) Procedure(s) (LRB): COMPUTER ASSISTED TOTAL KNEE ARTHROPLASTY (Right) Past Medical History:  Diagnosis Date  . Back pain   . Breast cancer ([place])    right breast cancer  . Cancer ([place]) right   positive 2004  . Cervical polyp   . Degenerative arthritis of right knee   . Diverticulitis   . Family history of adverse reaction to anesthesia    brother became combative in post op  . Pneumonia    as a child "drank kerosine when a baby"  . PONV (postoperative nausea and vomiting)   . Status post radiation therapy    2004 breast cancer  . Vitamin D deficiency    Active Problems:   Total knee replacement status  Estimated body mass index is 32.84 kg/m as calculated from the following:   Height as of this encounter: 5\' 8"  (1.727 m).   Weight as of this encounter: 98 kg. Advance diet Up with therapy Discharge home with home health  Bowel movement before discharge. Diet - Regular diet Follow up - in 2 weeks Activity - WBAT Disposition - Home Condition Upon Discharge - Good DVT Prophylaxis - Lovenox and TED hose  Reche Dixon, PA-C Orthopaedic Surgery 07/08/2018, 6:57 AM

## 2018-07-08 NOTE — Progress Notes (Signed)
  Subjective: 2 Days Post-Op Procedure(s) (LRB): COMPUTER ASSISTED TOTAL KNEE ARTHROPLASTY (Right) Patient reports pain as mild.   Patient seen in rounds with Dr. Marry Guan. Patient is well, and has had no acute complaints or problems Plan is to go Home after hospital stay. Negative for chest pain and shortness of breath Fever: no Gastrointestinal: Negative for nausea and vomiting  Objective: Vital signs in last 24 hours: Temp:  [97.4 F (36.3 C)-98 F (36.7 C)] 98 F (36.7 C) (02/13 2359) Pulse Rate:  [68-80] 80 (02/13 2359) Resp:  [15-19] 19 (02/13 2359) BP: (105-130)/(42-65) 105/54 (02/13 2359) SpO2:  [94 %-100 %] 94 % (02/13 2359)  Intake/Output from previous day:  Intake/Output Summary (Last 24 hours) at 07/08/2018 0654 Last data filed at 07/08/2018 0423 Gross per 24 hour  Intake 960 ml  Output 400 ml  Net 560 ml    Intake/Output this shift: Total I/O In: 240 [P.O.:240] Out: 0   Labs: No results for input(s): HGB in the last 72 hours. No results for input(s): WBC, RBC, HCT, PLT in the last 72 hours. No results for input(s): NA, K, CL, CO2, BUN, CREATININE, GLUCOSE, CALCIUM in the last 72 hours. No results for input(s): LABPT, INR in the last 72 hours.   EXAM General - Patient is Alert and Oriented Extremity - Neurovascular intact Sensation intact distally Compartment soft Dressing/Incision - clean, dry, with the Hemovac removed with the Hemovac tubing ends appearing to be intact. Motor Function - intact, moving foot and toes well on exam.  Able to straight leg raise independently.  Ambulated 150 feet with physical therapy.  Past Medical History:  Diagnosis Date  . Back pain   . Breast cancer ([place])    right breast cancer  . Cancer ([place]) right   positive 2004  . Cervical polyp   . Degenerative arthritis of right knee   . Diverticulitis   . Family history of adverse reaction to anesthesia    brother became combative in post op  . Pneumonia    as a child  "drank kerosine when a baby"  . PONV (postoperative nausea and vomiting)   . Status post radiation therapy    2004 breast cancer  . Vitamin D deficiency     Assessment/Plan: 2 Days Post-Op Procedure(s) (LRB): COMPUTER ASSISTED TOTAL KNEE ARTHROPLASTY (Right) Active Problems:   Total knee replacement status  Estimated body mass index is 32.84 kg/m as calculated from the following:   Height as of this encounter: 5\' 8"  (1.727 m).   Weight as of this encounter: 98 kg. Advance diet Up with therapy D/C IV fluids Discharge home with home health today Bowel management.  Needs to have a bowel movement before discharge.  DVT Prophylaxis - Lovenox, Foot Pumps and TED hose Weight-Bearing as tolerated to right leg  Reche Dixon, PA-C Orthopaedic Surgery 07/08/2018, 6:54 AM

## 2018-11-09 ENCOUNTER — Other Ambulatory Visit: Payer: Self-pay | Admitting: Internal Medicine

## 2018-11-09 DIAGNOSIS — Z1231 Encounter for screening mammogram for malignant neoplasm of breast: Secondary | ICD-10-CM

## 2018-12-20 ENCOUNTER — Other Ambulatory Visit: Payer: Self-pay

## 2018-12-20 ENCOUNTER — Ambulatory Visit
Admission: RE | Admit: 2018-12-20 | Discharge: 2018-12-20 | Disposition: A | Discharge status: Home / Self Care | Payer: Medicare Other | Source: Ambulatory Visit | Attending: Internal Medicine | Admitting: Internal Medicine

## 2018-12-20 DIAGNOSIS — Z1231 Encounter for screening mammogram for malignant neoplasm of breast: Secondary | ICD-10-CM | POA: Insufficient documentation

## 2019-11-20 ENCOUNTER — Other Ambulatory Visit: Payer: Self-pay | Admitting: Internal Medicine

## 2019-11-20 DIAGNOSIS — Z1231 Encounter for screening mammogram for malignant neoplasm of breast: Secondary | ICD-10-CM

## 2020-02-13 ENCOUNTER — Ambulatory Visit
Admission: RE | Admit: 2020-02-13 | Discharge: 2020-02-13 | Disposition: A | Discharge status: Home / Self Care | Payer: Medicare PPO | Source: Ambulatory Visit | Attending: Internal Medicine | Admitting: Internal Medicine

## 2020-02-13 DIAGNOSIS — Z1231 Encounter for screening mammogram for malignant neoplasm of breast: Secondary | ICD-10-CM | POA: Insufficient documentation

## 2020-12-30 ENCOUNTER — Other Ambulatory Visit: Payer: Self-pay | Admitting: Internal Medicine

## 2021-01-26 NOTE — Discharge Instructions (Signed)
Instructions after Total Knee Replacement   Kari Montero P. Burak Zerbe, Jr., M.D.     Dept. of Orthopaedics & Sports Medicine  [facility]  [address]  [place], [place]  [address]  Phone: [phone]   Fax: [phone]    DIET: Drink plenty of non-alcoholic fluids. Resume your normal diet. Include foods high in fiber.  ACTIVITY:  You may use crutches or a walker with weight-bearing as tolerated, unless instructed otherwise. You may be weaned off of the walker or crutches by your Physical Therapist.  Do NOT place pillows under the knee. Anything placed under the knee could limit your ability to straighten the knee.   Continue doing gentle exercises. Exercising will reduce the pain and swelling, increase motion, and prevent muscle weakness.   Please continue to use the TED compression stockings for 6 weeks. You may remove the stockings at night, but should reapply them in the morning. Do not drive or operate any equipment until instructed.  WOUND CARE:  Continue to use the PolarCare or ice packs periodically to reduce pain and swelling. You may bathe or shower after the staples are removed at the first office visit following surgery.  MEDICATIONS: You may resume your regular medications. Please take the pain medication as prescribed on the medication. Do not take pain medication on an empty stomach. You have been given a prescription for a blood thinner (Lovenox or Coumadin). Please take the medication as instructed. (NOTE: After completing a 2 week course of Lovenox, take one Enteric-coated aspirin once a day. This along with elevation will help reduce the possibility of phlebitis in your operated leg.) Do not drive or drink alcoholic beverages when taking pain medications.  CALL THE OFFICE FOR: Temperature above 101 degrees Excessive bleeding or drainage on the dressing. Excessive swelling, coldness, or paleness of the toes. Persistent nausea and vomiting.  FOLLOW-UP:  You  should have an appointment to return to the office in 10-14 days after surgery. Arrangements have been made for continuation of Physical Therapy (either home therapy or outpatient therapy).   [facility] Department Directory         [email or web address]       [email or web address]          Cardiology  Appointments: [place] - [phone] [place] - [phone]  Endocrinology  Appointments: [place] - [phone] [place] - [phone]  Gastroenterology  Appointments: [place] - [phone] [place] - [phone]        General Surgery   Appointments: [place] - [phone]  Internal Medicine/Family Medicine  Appointments: [place] - [phone] [place] - [phone] [place] - [phone]  Metabolic and Weigh Loss Surgery  Appointments: [place] - [phone]        Neurology  Appointments: [place] - [phone] [place] - [phone]  Neurosurgery  Appointments: [place] - [phone]  Obstetrics & Gynecology  Appointments: [place] - [phone] [place] - [phone]        Pediatrics  Appointments: [place] - [phone] [place] - [phone]  Physiatry  Appointments: [place] -[phone]  Physical Therapy  Appointments: [place] - [phone] [place] - [phone]        Podiatry  Appointments: [place] - [phone] [place] - [phone]  Pulmonology  Appointments: [place] - [phone]  Rheumatology  Appointments: [place] - [phone]        [place] Location: [facility]  [address] [place], [place]  [address]  [place] Location: [facility] [address] [place], [place]  [address]  [place] Location: [facility] [address] [place], [place]  [address]    

## 2021-01-31 ENCOUNTER — Encounter
Admission: RE | Admit: 2021-01-31 | Discharge: 2021-01-31 | Disposition: A | Discharge status: Home / Self Care | Payer: Medicare PPO | Source: Ambulatory Visit | Attending: Orthopedic Surgery | Admitting: Orthopedic Surgery

## 2021-01-31 ENCOUNTER — Other Ambulatory Visit: Payer: Self-pay

## 2021-01-31 DIAGNOSIS — Z01818 Encounter for other preprocedural examination: Secondary | ICD-10-CM | POA: Diagnosis not present

## 2021-01-31 HISTORY — DX: Diverticulosis of intestine, part unspecified, without perforation or abscess without bleeding: K57.90

## 2021-01-31 LAB — URINALYSIS, ROUTINE W REFLEX MICROSCOPIC
Bilirubin Urine: NEGATIVE
Glucose, UA: NEGATIVE mg/dL
Hgb urine dipstick: NEGATIVE
Ketones, ur: NEGATIVE mg/dL
Nitrite: NEGATIVE
Protein, ur: NEGATIVE mg/dL
Specific Gravity, Urine: >1.03 — ABNORMAL HIGH (ref 1.005–1.030)
pH: 5 (ref 5.0–8.0)

## 2021-01-31 LAB — URINALYSIS, MICROSCOPIC (REFLEX)

## 2021-01-31 LAB — TYPE AND SCREEN
ABO/RH(D): A POS
Antibody Screen: NEGATIVE

## 2021-01-31 LAB — PROTIME-INR
INR: 1 (ref 0.8–1.2)
Prothrombin Time: 13.1 seconds (ref 11.4–15.2)

## 2021-01-31 LAB — SURGICAL PCR SCREEN
MRSA, PCR: NEGATIVE
Staphylococcus aureus: NEGATIVE

## 2021-01-31 LAB — SEDIMENTATION RATE: Sed Rate: 9 mm/hr (ref 0–30)

## 2021-01-31 LAB — C-REACTIVE PROTEIN: CRP: 0.5 mg/dL (ref <1.0)

## 2021-01-31 LAB — APTT: aPTT: 26 seconds (ref 24–36)

## 2021-01-31 NOTE — Patient Instructions (Addendum)
Your procedure is scheduled on: Monday, September 19 Report to the Registration Desk on the 1st floor of the [facility]. To find out your arrival time, please call [phone] between 1PM - 3PM on: Friday, September 16  REMEMBER: Instructions that are not followed completely may result in serious medical risk, up to and including death; or upon the discretion of your surgeon and anesthesiologist your surgery may need to be rescheduled.  Do not eat food after midnight the night before surgery.  No gum chewing, lozengers or hard candies.  You may however, drink CLEAR liquids up to 2 hours before you are scheduled to arrive for your surgery. Do not drink anything within 2 hours of your scheduled arrival time.  Clear liquids include: - water  - apple juice without pulp - gatorade (not RED, PURPLE, OR BLUE) - black coffee or tea (Do NOT add milk or creamers to the coffee or tea) Do NOT drink anything that is not on this list.  In addition, your doctor has ordered for you to drink the provided  Ensure Pre-Surgery Clear Carbohydrate Drink  Drinking this carbohydrate drink up to two hours before surgery helps to reduce insulin resistance and improve patient outcomes. Please complete drinking 2 hours prior to scheduled arrival time.  DO NOT TAKE ANY MEDICATIONS THE MORNING OF SURGERY   One week prior to surgery: STARTING September 12 Stop ASPIRIN, Anti-inflammatories (NSAIDS) such as Advil, Aleve, Ibuprofen, Motrin, Naproxen, Naprosyn and Aspirin based products such as Excedrin, Goodys Powder, BC Powder. Stop ANY OVER THE COUNTER supplements until after surgery. (VITAMIN D) You may however, continue to take Tylenol if needed for pain up until the day of surgery.  No Alcohol for 24 hours before or after surgery.  No Smoking including e-cigarettes for 24 hours prior to surgery.  No chewable tobacco products for at least 6 hours prior to surgery.  No nicotine patches on the day of  surgery.   On the morning of surgery brush your teeth with toothpaste and water, you may rinse your mouth with mouthwash if you wish. Do not swallow any toothpaste or mouthwash.  Do not wear jewelry, make-up, hairpins, clips or nail polish.  Do not wear lotions, powders, or perfumes.   Do not shave body from the neck down 48 hours prior to surgery just in case you cut yourself which could leave a site for infection.  Also, freshly shaved skin may become irritated if using the CHG soap.  Contact lenses, hearing aids and dentures may not be worn into surgery.  Do not bring valuables to the hospital. [facility] is not responsible for any missing/lost belongings or valuables.   Use CHG Soap as directed on instruction sheet.  Notify your doctor if there is any change in your medical condition (cold, fever, infection).  Wear comfortable clothing (specific to your surgery type) to the hospital.  After surgery, you can help prevent lung complications by doing breathing exercises.  Take deep breaths and cough every 1-2 hours. Your doctor may order a device called an Incentive Spirometer to help you take deep breaths.  If you are being admitted to the hospital overnight, leave your suitcase in the car. After surgery it may be brought to your room.  Please call the [place] Dept. at [phone] if you have any questions about these instructions.  Surgery Visitation Policy:  Patients undergoing a surgery or procedure may have one family member or support person with them as long as  that person is not COVID-19 positive or experiencing its symptoms.  That person may remain in the waiting area during the procedure.  Inpatient Visitation:    Visiting hours are 7 a.m. to 8 p.m. Inpatients will be allowed two visitors daily. The visitors may change each day during the patient's stay. No visitors under the age of 84. Any visitor under the age of 36 must be accompanied by an  adult. The visitor must pass COVID-19 screenings, use hand sanitizer when entering and exiting the patient's room and wear a mask at all times, including in the patient's room. Patients must also wear a mask when staff or their visitor are in the room. Masking is required regardless of vaccination status.

## 2021-02-01 LAB — URINE CULTURE
Culture: NO GROWTH
Special Requests: NORMAL

## 2021-02-05 DIAGNOSIS — G8929 Other chronic pain: Secondary | ICD-10-CM | POA: Insufficient documentation

## 2021-02-07 ENCOUNTER — Other Ambulatory Visit: Admission: RE | Admit: 2021-02-07 | Payer: Medicare PPO | Source: Ambulatory Visit

## 2021-02-09 ENCOUNTER — Encounter: Payer: Self-pay | Admitting: Orthopedic Surgery

## 2021-02-09 NOTE — H&P (Signed)
ORTHOPAEDIC HISTORY & PHYSICAL Gwenlyn Fudge, [place] - 01/31/2021 8:45 AM EDT Formatting of this note is different from the original. [place] [facility] Chief Complaint:   Chief Complaint  Patient presents with   Knee Pain  H & P LEFT KNEE   History of Present Illness:   Regina Blanchard is a 70 y.o. female that presents to clinic today for her preoperative history and evaluation. The patient is scheduled to undergo a left total knee arthroplasty on 02/10/21 by Dr. Marry Guan. Her pain began several years ago. The pain is located along the medial aspect of the knee. She describes her pain as worse with weightbearing/. She reports associated swelling and giving way of the knee. She denies associated numbness or tingling, denies locking.   The patient's symptoms have progressed to the point that they decrease her quality of life. The patient has previously undergone conservative treatment including NSAIDS and injections to the knee without adequate control of her symptoms.  Denies history of lumbar surgery, significant cardiac history, or blood clots.  No significant drug allergies. Patient will have her husband at home after surgery to assist her as needed.  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 Kurtis Bushman @ [place], rpt 5 yrs per [facility]   COLONOSCOPY 07/30/2020  Diverticulosis/Otherwise normal colon/PHx CP/Repeat 33yr/TKT   Cross-eye surgery  age 70  EBerlin infertility   Hx of breast biopsy 1990   MASTECTOMY PARTIAL / LUMPECTOMY 2004  carcinoma in-situ   Right total knee  arthroplasty using computer-assisted navigation 07/06/2018  Dr HMarry Guan  Current Medications:  Current Outpatient Medications  Medication Sig Dispense Refill   ALPRAZolam (XANAX) 0.25 MG tablet Take 1 tablet (0.25 mg total) by mouth once daily as needed for Sleep 30 tablet 5   aspirin 81 MG EC tablet Take 81 mg by mouth once daily   baclofen (LIORESAL) 10 MG tablet Take 1 tablet (10 mg total) by mouth once daily as needed 30 tablet 11   cholecalciferol (VITAMIN D3) 5,000 unit capsule Take 5,000 Units by mouth once daily   diclofenac (VOLTAREN) 1 % topical gel Apply 2 g topically 2 (two) times daily as needed   Herbal Supplement Take 2 capsules by mouth once daily Herbal Name: 2 tabs q day - fruit / 2 tab a day - vegetable supplement   hydroCHLOROthiazide (HYDRODIURIL) 25 MG tablet Take 1 tablet (25 mg total) by mouth as needed 90 tablet 3   ibuprofen (ADVIL,MOTRIN) 200 MG tablet Take 400 mg by mouth every 6 (six) hours as needed for Pain   ondansetron (ZOFRAN-ODT) 4 MG disintegrating tablet Take by mouth   tiZANidine (ZANAFLEX) 4 MG tablet TAKE 1 TABLET(4 MG) BY MOUTH TWICE DAILY AS NEEDED FOR MUSCLE SPASMS 60 tablet 3   No current facility-administered medications for this visit.   Allergies: No Known Allergies  Social History:  Social History   Socioeconomic History   Marital status: Married  Spouse name: RTommie Raymond  Number of children: 1   Years of education: 14   Highest education level: Associate degree: occupational, tHotel manager or vocational program  Occupational History   Occupation: Retired Community education officer  Tobacco Use   Smoking status: Never Smoker   Smokeless tobacco: Never Used  Scientific laboratory technician Use: Never used  Substance and Sexual Activity   Alcohol use: No   Drug use: No   Sexual activity: Yes  Partners: Male   Family History:  Family History  Problem Relation Age of Onset   Myocardial Infarction (Heart attack) Father  x2   Heart failure Father   Diabetes Mother    Parkinsonism Mother   Diabetes type II Mother   Colon cancer Maternal Aunt   Diabetes type II Sister   Diabetes type II Brother   Review of Systems:   A 10+ ROS was performed, reviewed, and the pertinent orthopaedic findings are documented in the HPI.   Physical Examination:   BP (!) 140/86 (BP Location: Left upper arm, Patient Position: Sitting, BP Cuff Size: Large Adult)  Ht 172.7 cm ('5\' 8"'$ )  Wt 87.6 kg (193 lb 3.2 oz)  BMI 29.38 kg/m   Patient is a well-developed, well-nourished female in no acute distress. Patient has normal mood and affect. Patient is alert and oriented to person, place, and time.   HEENT: Atraumatic, normocephalic. Pupils equal and reactive to light. Extraocular motion intact. Noninjected sclera.  Cardiovascular: Regular rate and rhythm, with no murmurs, rubs, or gallops. Distal pulses palpable.  Respiratory: Lungs clear to auscultation bilaterally.   Left Knee: Soft tissue swelling: mild Effusion: none Erythema: none Crepitance: mild Tenderness: medial Alignment: relative varus Mediolateral laxity: medial pseudolaxity Posterior sag: negative Patellar tracking: Good tracking without evidence of subluxation or tilt Atrophy: No significant atrophy.  Quadriceps tone was fair to good. Range of motion: 0/18/110 degrees  Sensation intact over the saphenous, lateral sural cutaneous, superficial fibular, and deep fibular nerve distributions.  Tests Performed/Reviewed:  X-rays  X-ray knee left 3 views  Result Date: 02/02/2021 3 views of the left knee were obtained. Images reveal severe loss of medial compartment joint space with osteophyte formation. No fractures or dislocations. No other osseous abnormality noted   Impression:   ICD-10-CM  1. Primary osteoarthritis of left knee M17.12   Plan:   The patient has end-stage degenerative changes of the left knee. It was explained to the patient that the condition is progressive in nature. Having  failed conservative treatment, the patient has elected to proceed with a total joint arthroplasty. The patient will undergo a total joint arthroplasty with Dr. Marry Guan. The risks of surgery, including blood clot and infection, were discussed with the patient. Measures to reduce these risks, including the use of anticoagulation, perioperative antibiotics, and early ambulation were discussed. The importance of postoperative physical therapy was discussed with the patient. The patient elects to proceed with surgery. The patient is instructed to stop all blood thinners prior to surgery. The patient is instructed to call the hospital the day before surgery to learn of the proper arrival time.   Contact our office with any questions or concerns. Follow up as indicated, or sooner should any new problems arise, if conditions worsen, or if they are otherwise concerned.   Gwenlyn Fudge, [facility] [place], [place] [address] Phone: [phone]  This note was generated in part with voice recognition software and I apologize for any typographical errors that were not detected and corrected.  Electronically signed by Gwenlyn Fudge, PA at 02/02/2021 12:45 PM EDT

## 2021-02-10 ENCOUNTER — Other Ambulatory Visit: Payer: Self-pay | Admitting: Orthopedic Surgery

## 2021-02-10 ENCOUNTER — Observation Stay
Admission: RE | Admit: 2021-02-10 | Discharge: 2021-02-11 | Disposition: A | Discharge status: Home / Self Care | Payer: Medicare PPO | Attending: Orthopedic Surgery | Admitting: Orthopedic Surgery

## 2021-02-10 ENCOUNTER — Ambulatory Visit: Payer: Medicare PPO | Admitting: Anesthesiology

## 2021-02-10 ENCOUNTER — Ambulatory Visit
Admission: RE | Admit: 2021-02-10 | Discharge: 2021-02-10 | Disposition: A | Discharge status: Home / Self Care | Payer: Self-pay | Source: Ambulatory Visit | Attending: Orthopedic Surgery | Admitting: Orthopedic Surgery

## 2021-02-10 ENCOUNTER — Other Ambulatory Visit: Payer: Self-pay

## 2021-02-10 ENCOUNTER — Encounter: Payer: Self-pay | Admitting: Orthopedic Surgery

## 2021-02-10 ENCOUNTER — Observation Stay: Payer: Medicare PPO

## 2021-02-10 ENCOUNTER — Encounter
Admission: RE | Disposition: A | Discharge status: Home / Self Care | Payer: Self-pay | Source: Home / Self Care | Attending: Orthopedic Surgery

## 2021-02-10 ENCOUNTER — Ambulatory Visit: Payer: Medicare PPO | Admitting: Urgent Care

## 2021-02-10 DIAGNOSIS — Z79899 Other long term (current) drug therapy: Secondary | ICD-10-CM | POA: Insufficient documentation

## 2021-02-10 DIAGNOSIS — Z7982 Long term (current) use of aspirin: Secondary | ICD-10-CM | POA: Insufficient documentation

## 2021-02-10 DIAGNOSIS — M1712 Unilateral primary osteoarthritis, left knee: Secondary | ICD-10-CM | POA: Diagnosis present

## 2021-02-10 DIAGNOSIS — Z853 Personal history of malignant neoplasm of breast: Secondary | ICD-10-CM | POA: Diagnosis not present

## 2021-02-10 DIAGNOSIS — Z96651 Presence of right artificial knee joint: Secondary | ICD-10-CM | POA: Diagnosis not present

## 2021-02-10 DIAGNOSIS — M25562 Pain in left knee: Secondary | ICD-10-CM

## 2021-02-10 DIAGNOSIS — I10 Essential (primary) hypertension: Secondary | ICD-10-CM | POA: Insufficient documentation

## 2021-02-10 DIAGNOSIS — Z96659 Presence of unspecified artificial knee joint: Secondary | ICD-10-CM

## 2021-02-10 HISTORY — PX: KNEE ARTHROPLASTY: SHX992

## 2021-02-10 SURGERY — ARTHROPLASTY, KNEE, TOTAL, USING IMAGELESS COMPUTER-ASSISTED NAVIGATION
Anesthesia: Spinal | Site: Knee | Laterality: Left

## 2021-02-10 MED ORDER — NEOMYCIN-POLYMYXIN B GU 40-200000 IR SOLN
Status: DC | PRN
  Administered 2021-02-10: 14 mL

## 2021-02-10 MED ORDER — METOCLOPRAMIDE HCL 10 MG PO TABS
10.0000 mg | ORAL_TABLET | Freq: Three times a day (TID) | ORAL | Status: DC
  Administered 2021-02-10 – 2021-02-11 (×4): 10 mg via ORAL
  Filled 2021-02-10 (×4): qty 1

## 2021-02-10 MED ORDER — ACETAMINOPHEN 10 MG/ML IV SOLN
1000.0000 mg | Freq: Four times a day (QID) | INTRAVENOUS | Status: AC
  Administered 2021-02-10 – 2021-02-11 (×4): 1000 mg via INTRAVENOUS
  Filled 2021-02-10 (×4): qty 100

## 2021-02-10 MED ORDER — SODIUM CHLORIDE 0.9 % IV SOLN: INTRAVENOUS | Status: DC

## 2021-02-10 MED ORDER — BUPIVACAINE HCL (PF) 0.25 % IJ SOLN
INTRAMUSCULAR | Status: DC | PRN
  Administered 2021-02-10: 60 mL

## 2021-02-10 MED ORDER — PROPOFOL 10 MG/ML IV BOLUS
INTRAVENOUS | Status: DC | PRN
  Administered 2021-02-10: 50 mg via INTRAVENOUS

## 2021-02-10 MED ORDER — SODIUM CHLORIDE 0.9 % IV SOLN
INTRAVENOUS | Status: DC | PRN
  Administered 2021-02-10: 20 ug/min via INTRAVENOUS

## 2021-02-10 MED ORDER — FERROUS SULFATE 325 (65 FE) MG PO TABS
325.0000 mg | ORAL_TABLET | Freq: Two times a day (BID) | ORAL | Status: DC
  Administered 2021-02-10 – 2021-02-11 (×3): 325 mg via ORAL
  Filled 2021-02-10 (×3): qty 1

## 2021-02-10 MED ORDER — DEXAMETHASONE SODIUM PHOSPHATE 10 MG/ML IJ SOLN
INTRAMUSCULAR | Status: AC
  Administered 2021-02-10: 8 mg via INTRAVENOUS
  Filled 2021-02-10: qty 1

## 2021-02-10 MED ORDER — CHLORHEXIDINE GLUCONATE 4 % EX LIQD: 60.0000 mL | Freq: Once | CUTANEOUS | Status: DC

## 2021-02-10 MED ORDER — FAMOTIDINE 20 MG PO TABS
ORAL_TABLET | ORAL | Status: AC
  Administered 2021-02-10: 20 mg via ORAL
  Filled 2021-02-10: qty 1

## 2021-02-10 MED ORDER — ENSURE PRE-SURGERY PO LIQD
296.0000 mL | Freq: Once | ORAL | Status: AC
  Administered 2021-02-10: 296 mL via ORAL
  Filled 2021-02-10: qty 296

## 2021-02-10 MED ORDER — DIPHENHYDRAMINE HCL 12.5 MG/5ML PO ELIX: 12.5000 mg | ORAL_SOLUTION | ORAL | Status: DC | PRN

## 2021-02-10 MED ORDER — FENTANYL CITRATE (PF) 100 MCG/2ML IJ SOLN
25.0000 ug | INTRAMUSCULAR | Status: DC | PRN
  Administered 2021-02-10 (×3): 25 ug via INTRAVENOUS

## 2021-02-10 MED ORDER — LACTATED RINGERS IV SOLN: INTRAVENOUS | Status: DC

## 2021-02-10 MED ORDER — FLEET ENEMA 7-19 GM/118ML RE ENEM: 1.0000 | ENEMA | Freq: Once | RECTAL | Status: DC | PRN

## 2021-02-10 MED ORDER — PANTOPRAZOLE SODIUM 40 MG PO TBEC
40.0000 mg | DELAYED_RELEASE_TABLET | Freq: Two times a day (BID) | ORAL | Status: DC
  Administered 2021-02-10 – 2021-02-11 (×2): 40 mg via ORAL
  Filled 2021-02-10 (×2): qty 1

## 2021-02-10 MED ORDER — CELECOXIB 200 MG PO CAPS
200.0000 mg | ORAL_CAPSULE | Freq: Two times a day (BID) | ORAL | Status: DC
  Administered 2021-02-10 – 2021-02-11 (×2): 200 mg via ORAL
  Filled 2021-02-10 (×2): qty 1

## 2021-02-10 MED ORDER — TRANEXAMIC ACID-NACL 1000-0.7 MG/100ML-% IV SOLN: 1000.0000 mg | INTRAVENOUS | Status: AC

## 2021-02-10 MED ORDER — OXYCODONE HCL 5 MG/5ML PO SOLN
5.0000 mg | Freq: Once | ORAL | Status: DC | PRN
Start: 2021-02-10 — End: 2021-02-10

## 2021-02-10 MED ORDER — MAGNESIUM HYDROXIDE 400 MG/5ML PO SUSP: 30.0000 mL | Freq: Every day | ORAL | Status: DC

## 2021-02-10 MED ORDER — ACETAMINOPHEN 10 MG/ML IV SOLN
INTRAVENOUS | Status: AC
  Filled 2021-02-10: qty 100

## 2021-02-10 MED ORDER — PROPOFOL 500 MG/50ML IV EMUL
INTRAVENOUS | Status: DC | PRN
  Administered 2021-02-10: 50 ug/kg/min via INTRAVENOUS

## 2021-02-10 MED ORDER — BISACODYL 10 MG RE SUPP: 10.0000 mg | Freq: Every day | RECTAL | Status: DC | PRN

## 2021-02-10 MED ORDER — OXYCODONE HCL 5 MG PO TABS
5.0000 mg | ORAL_TABLET | ORAL | Status: DC | PRN
  Administered 2021-02-10 – 2021-02-11 (×2): 5 mg via ORAL
  Filled 2021-02-10: qty 1

## 2021-02-10 MED ORDER — DEXAMETHASONE SODIUM PHOSPHATE 10 MG/ML IJ SOLN: 8.0000 mg | Freq: Once | INTRAMUSCULAR | Status: AC

## 2021-02-10 MED ORDER — TRANEXAMIC ACID-NACL 1000-0.7 MG/100ML-% IV SOLN: 1000.0000 mg | Freq: Once | INTRAVENOUS | Status: DC

## 2021-02-10 MED ORDER — HYDROMORPHONE HCL 1 MG/ML IJ SOLN: 0.5000 mg | INTRAMUSCULAR | Status: DC | PRN

## 2021-02-10 MED ORDER — TRANEXAMIC ACID-NACL 1000-0.7 MG/100ML-% IV SOLN
INTRAVENOUS | Status: AC
  Administered 2021-02-10: 1000 mg via INTRAVENOUS
  Filled 2021-02-10: qty 100

## 2021-02-10 MED ORDER — CEFAZOLIN SODIUM-DEXTROSE 2-4 GM/100ML-% IV SOLN
2.0000 g | INTRAVENOUS | Status: AC
  Administered 2021-02-10: 2 g via INTRAVENOUS

## 2021-02-10 MED ORDER — SODIUM CHLORIDE 0.9 % IR SOLN
Status: DC | PRN
  Administered 2021-02-10: 3000 mL

## 2021-02-10 MED ORDER — PHENOL 1.4 % MT LIQD
1.0000 | OROMUCOSAL | Status: DC | PRN
  Filled 2021-02-10: qty 177

## 2021-02-10 MED ORDER — CHLORHEXIDINE GLUCONATE 0.12 % MT SOLN: 15.0000 mL | Freq: Once | OROMUCOSAL | Status: AC

## 2021-02-10 MED ORDER — ONDANSETRON HCL 4 MG PO TABS: 4.0000 mg | ORAL_TABLET | Freq: Four times a day (QID) | ORAL | Status: DC | PRN

## 2021-02-10 MED ORDER — CELECOXIB 200 MG PO CAPS: 400.0000 mg | ORAL_CAPSULE | Freq: Once | ORAL | Status: AC

## 2021-02-10 MED ORDER — TRANEXAMIC ACID-NACL 1000-0.7 MG/100ML-% IV SOLN
INTRAVENOUS | Status: DC | PRN
Start: 2021-02-10 — End: 2021-02-10
  Administered 2021-02-10: 1000 mg via INTRAVENOUS

## 2021-02-10 MED ORDER — ONDANSETRON HCL 4 MG/2ML IJ SOLN: 4.0000 mg | Freq: Four times a day (QID) | INTRAMUSCULAR | Status: DC | PRN

## 2021-02-10 MED ORDER — ENOXAPARIN SODIUM 30 MG/0.3ML IJ SOSY
30.0000 mg | PREFILLED_SYRINGE | Freq: Two times a day (BID) | INTRAMUSCULAR | Status: DC
  Administered 2021-02-11: 30 mg via SUBCUTANEOUS
  Filled 2021-02-10: qty 0.3

## 2021-02-10 MED ORDER — GABAPENTIN 300 MG PO CAPS
ORAL_CAPSULE | ORAL | Status: AC
  Administered 2021-02-10: 300 mg via ORAL
  Filled 2021-02-10: qty 1

## 2021-02-10 MED ORDER — 0.9 % SODIUM CHLORIDE (POUR BTL) OPTIME
TOPICAL | Status: DC | PRN
  Administered 2021-02-10: 500 mL

## 2021-02-10 MED ORDER — SODIUM CHLORIDE 0.9 % IV SOLN
INTRAVENOUS | Status: DC | PRN
  Administered 2021-02-10: 60 mL

## 2021-02-10 MED ORDER — APREPITANT 40 MG PO CAPS: 40.0000 mg | ORAL_CAPSULE | Freq: Once | ORAL | Status: AC

## 2021-02-10 MED ORDER — BUPIVACAINE HCL (PF) 0.5 % IJ SOLN
INTRAMUSCULAR | Status: DC | PRN
  Administered 2021-02-10: 3 mL via INTRATHECAL

## 2021-02-10 MED ORDER — CHLORHEXIDINE GLUCONATE 0.12 % MT SOLN
OROMUCOSAL | Status: AC
  Administered 2021-02-10: 15 mL via OROMUCOSAL
  Filled 2021-02-10: qty 15

## 2021-02-10 MED ORDER — SENNOSIDES-DOCUSATE SODIUM 8.6-50 MG PO TABS
1.0000 | ORAL_TABLET | Freq: Two times a day (BID) | ORAL | Status: DC
  Administered 2021-02-10: 1 via ORAL
  Filled 2021-02-10 (×2): qty 1

## 2021-02-10 MED ORDER — APREPITANT 40 MG PO CAPS
ORAL_CAPSULE | ORAL | Status: AC
  Administered 2021-02-10: 40 mg via ORAL
  Filled 2021-02-10: qty 1

## 2021-02-10 MED ORDER — TRAMADOL HCL 50 MG PO TABS
50.0000 mg | ORAL_TABLET | ORAL | Status: DC | PRN
  Administered 2021-02-11: 50 mg via ORAL
  Filled 2021-02-10: qty 1

## 2021-02-10 MED ORDER — CEFAZOLIN SODIUM-DEXTROSE 2-4 GM/100ML-% IV SOLN
INTRAVENOUS | Status: AC
  Filled 2021-02-10: qty 100

## 2021-02-10 MED ORDER — MIDAZOLAM HCL 5 MG/5ML IJ SOLN
INTRAMUSCULAR | Status: DC | PRN
  Administered 2021-02-10: 2 mg via INTRAVENOUS

## 2021-02-10 MED ORDER — FENTANYL CITRATE (PF) 100 MCG/2ML IJ SOLN
INTRAMUSCULAR | Status: AC
  Administered 2021-02-10: 25 ug via INTRAVENOUS
  Filled 2021-02-10: qty 2

## 2021-02-10 MED ORDER — CEFAZOLIN SODIUM-DEXTROSE 2-4 GM/100ML-% IV SOLN
2.0000 g | Freq: Four times a day (QID) | INTRAVENOUS | Status: AC
Start: 2021-02-10 — End: 2021-02-11
  Administered 2021-02-10 (×2): 2 g via INTRAVENOUS
  Filled 2021-02-10 (×2): qty 100

## 2021-02-10 MED ORDER — FENTANYL CITRATE (PF) 100 MCG/2ML IJ SOLN
INTRAMUSCULAR | Status: AC
  Filled 2021-02-10: qty 2

## 2021-02-10 MED ORDER — MIDAZOLAM HCL 2 MG/2ML IJ SOLN
INTRAMUSCULAR | Status: AC
  Filled 2021-02-10: qty 2

## 2021-02-10 MED ORDER — ACETAMINOPHEN 325 MG PO TABS: 325.0000 mg | ORAL_TABLET | Freq: Four times a day (QID) | ORAL | Status: DC | PRN

## 2021-02-10 MED ORDER — SURGIPHOR WOUND IRRIGATION SYSTEM - OPTIME
TOPICAL | Status: DC | PRN
  Administered 2021-02-10: 450 mL via TOPICAL

## 2021-02-10 MED ORDER — OXYCODONE HCL 5 MG PO TABS: 5.0000 mg | ORAL_TABLET | Freq: Once | ORAL | Status: DC | PRN

## 2021-02-10 MED ORDER — ACETAMINOPHEN 10 MG/ML IV SOLN
INTRAVENOUS | Status: DC | PRN
  Administered 2021-02-10: 1000 mg via INTRAVENOUS

## 2021-02-10 MED ORDER — GABAPENTIN 300 MG PO CAPS: 300.0000 mg | ORAL_CAPSULE | Freq: Once | ORAL | Status: AC

## 2021-02-10 MED ORDER — FAMOTIDINE 20 MG PO TABS: 20.0000 mg | ORAL_TABLET | Freq: Once | ORAL | Status: AC

## 2021-02-10 MED ORDER — DEXMEDETOMIDINE (PRECEDEX) IN NS 20 MCG/5ML (4 MCG/ML) IV SYRINGE
PREFILLED_SYRINGE | INTRAVENOUS | Status: DC | PRN
  Administered 2021-02-10: 4 ug via INTRAVENOUS

## 2021-02-10 MED ORDER — ALUM & MAG HYDROXIDE-SIMETH 200-200-20 MG/5ML PO SUSP: 30.0000 mL | ORAL | Status: DC | PRN

## 2021-02-10 MED ORDER — MENTHOL 3 MG MT LOZG
1.0000 | LOZENGE | OROMUCOSAL | Status: DC | PRN
  Filled 2021-02-10: qty 9

## 2021-02-10 MED ORDER — ORAL CARE MOUTH RINSE: 15.0000 mL | Freq: Once | OROMUCOSAL | Status: AC

## 2021-02-10 MED ORDER — CELECOXIB 200 MG PO CAPS
ORAL_CAPSULE | ORAL | Status: AC
  Administered 2021-02-10: 400 mg via ORAL
  Filled 2021-02-10: qty 2

## 2021-02-10 MED ORDER — OXYCODONE HCL 5 MG PO TABS
10.0000 mg | ORAL_TABLET | ORAL | Status: DC | PRN
  Administered 2021-02-10 – 2021-02-11 (×3): 10 mg via ORAL
  Filled 2021-02-10 (×4): qty 2

## 2021-02-10 MED ORDER — TRANEXAMIC ACID-NACL 1000-0.7 MG/100ML-% IV SOLN
INTRAVENOUS | Status: AC
  Filled 2021-02-10: qty 100

## 2021-02-10 SURGICAL SUPPLY — 71 items
ATTUNE MED DOME PAT 38 KNEE (Knees) ×1 IMPLANT
ATTUNE PSFEM LTSZ6 NARCEM KNEE (Femur) ×1 IMPLANT
ATTUNE PSRP INSR SZ6 5 KNEE (Insert) ×1 IMPLANT
BASE TIBIA ATTUNE KNEE SYS SZ6 (Knees) IMPLANT
BATTERY INSTRU NAVIGATION (MISCELLANEOUS) ×8 IMPLANT
BLADE SAW 70X12.5 (BLADE) ×2 IMPLANT
BLADE SAW 90X13X1.19 OSCILLAT (BLADE) ×2 IMPLANT
BLADE SAW 90X25X1.19 OSCILLAT (BLADE) ×2 IMPLANT
CEMENT HV SMART SET (Cement) ×2 IMPLANT
COOLER POLAR GLACIER W/PUMP (MISCELLANEOUS) ×2 IMPLANT
CUFF TOURN SGL QUICK 24 (TOURNIQUET CUFF) ×1
CUFF TRNQT CYL 24X4X16.5-23 (TOURNIQUET CUFF) IMPLANT
DRAPE 3/4 80X56 (DRAPES) ×2 IMPLANT
DRAPE INCISE IOBAN 66X45 STRL (DRAPES) ×2 IMPLANT
DRSG DERMACEA 8X12 NADH (GAUZE/BANDAGES/DRESSINGS) ×2 IMPLANT
DRSG MEPILEX SACRM 8.7X9.8 (GAUZE/BANDAGES/DRESSINGS) ×2 IMPLANT
DRSG OPSITE POSTOP 4X14 (GAUZE/BANDAGES/DRESSINGS) ×2 IMPLANT
DRSG TEGADERM 4X4.75 (GAUZE/BANDAGES/DRESSINGS) ×2 IMPLANT
DURAPREP 26ML APPLICATOR (WOUND CARE) ×4 IMPLANT
ELECT CAUTERY BLADE 6.4 (BLADE) ×2 IMPLANT
ELECT REM PT RETURN 9FT ADLT (ELECTROSURGICAL) ×2
ELECTRODE REM PT RTRN 9FT ADLT (ELECTROSURGICAL) ×1 IMPLANT
EX-PIN ORTHOLOCK NAV 4X150 (PIN) ×4 IMPLANT
GLOVE SURG ENC TEXT LTX SZ7.5 (GLOVE) ×4 IMPLANT
GLOVE SURG UNDER POLY LF SZ7.5 (GLOVE) ×4 IMPLANT
GOWN STRL REUS W/ TWL LRG LVL3 (GOWN DISPOSABLE) ×2 IMPLANT
GOWN STRL REUS W/ TWL XL LVL3 (GOWN DISPOSABLE) ×1 IMPLANT
GOWN STRL REUS W/TWL LRG LVL3 (GOWN DISPOSABLE) ×2
GOWN STRL REUS W/TWL XL LVL3 (GOWN DISPOSABLE) ×1
HEMOVAC 400CC 10FR (MISCELLANEOUS) ×2 IMPLANT
HOLDER FOLEY CATH W/STRAP (MISCELLANEOUS) ×2 IMPLANT
IRRIGATION SURGIPHOR STRL (IV SOLUTION) ×2 IMPLANT
IV NS IRRIG 3000ML ARTHROMATIC (IV SOLUTION) ×2 IMPLANT
KIT TURNOVER KIT A (KITS) ×2 IMPLANT
KNIFE SCULPS 14X20 (INSTRUMENTS) ×2 IMPLANT
LABEL OR SOLS (LABEL) ×2 IMPLANT
MANIFOLD NEPTUNE II (INSTRUMENTS) ×4 IMPLANT
NDL SAFETY ECLIPSE 18X1.5 (NEEDLE) ×1 IMPLANT
NDL SPNL 20GX3.5 QUINCKE YW (NEEDLE) ×2 IMPLANT
NEEDLE HYPO 18GX1.5 SHARP (NEEDLE) ×1
NEEDLE SPNL 20GX3.5 QUINCKE YW (NEEDLE) ×4 IMPLANT
NS IRRIG 500ML POUR BTL (IV SOLUTION) ×2 IMPLANT
PACK TOTAL KNEE (MISCELLANEOUS) ×2 IMPLANT
PAD ABD DERMACEA PRESS 5X9 (GAUZE/BANDAGES/DRESSINGS) ×4 IMPLANT
PAD WRAPON POLAR KNEE (MISCELLANEOUS) ×1 IMPLANT
PENCIL SMOKE EVACUATOR COATED (MISCELLANEOUS) ×2 IMPLANT
PIN DRILL FIX HALF THREAD (BIT) ×4 IMPLANT
PIN DRILL QUICK PACK ×3 IMPLANT
PIN FIXATION 1/8DIA X 3INL (PIN) ×2 IMPLANT
PULSAVAC PLUS IRRIG FAN TIP (DISPOSABLE) ×2
SOL PREP PVP 2OZ (MISCELLANEOUS) ×2
SOLUTION PREP PVP 2OZ (MISCELLANEOUS) ×1 IMPLANT
SPONGE DRAIN TRACH 4X4 STRL 2S (GAUZE/BANDAGES/DRESSINGS) ×2 IMPLANT
SPONGE T-LAP 18X18 ~~LOC~~+RFID (SPONGE) ×6 IMPLANT
STAPLER SKIN PROX 35W (STAPLE) ×2 IMPLANT
STOCKINETTE IMPERV 14X48 (MISCELLANEOUS) ×1 IMPLANT
STRAP TIBIA SHORT (MISCELLANEOUS) ×2 IMPLANT
SUCTION FRAZIER HANDLE 10FR (MISCELLANEOUS) ×1
SUCTION TUBE FRAZIER 10FR DISP (MISCELLANEOUS) ×1 IMPLANT
SUT VIC AB 0 CT1 36 (SUTURE) ×4 IMPLANT
SUT VIC AB 1 CT1 36 (SUTURE) ×4 IMPLANT
SUT VIC AB 2-0 CT2 27 (SUTURE) ×2 IMPLANT
SYR 20ML LL LF (SYRINGE) ×2 IMPLANT
SYR 30ML LL (SYRINGE) ×4 IMPLANT
TIBIA ATTUNE KNEE SYS BASE SZ6 (Knees) ×2 IMPLANT
TIP FAN IRRIG PULSAVAC PLUS (DISPOSABLE) ×1 IMPLANT
TOWEL OR 17X26 4PK STRL BLUE (TOWEL DISPOSABLE) ×2 IMPLANT
TOWER CARTRIDGE SMART MIX (DISPOSABLE) ×2 IMPLANT
TRAY FOLEY MTR SLVR 16FR STAT (SET/KITS/TRAYS/PACK) ×2 IMPLANT
WATER STERILE IRR 500ML POUR (IV SOLUTION) ×2 IMPLANT
WRAPON POLAR PAD KNEE (MISCELLANEOUS) ×2

## 2021-02-10 NOTE — Transfer of Care (Signed)
Immediate Anesthesia Transfer of Care Note  Patient: Regina Blanchard  Procedure(s) Performed: COMPUTER ASSISTED TOTAL KNEE ARTHROPLASTY (Left: Knee)  Patient Location: PACU  Anesthesia Type:Spinal  Level of Consciousness: awake, alert  and oriented  Airway & Oxygen Therapy: Patient Spontanous Breathing and Patient connected to face mask oxygen  Post-op Assessment: Report given to RN and Post -op Vital signs reviewed and stable  Post vital signs: Reviewed and stable  Last Vitals:  Vitals Value Taken Time  BP 100/74 02/10/21 1501  Temp    Pulse 78 02/10/21 1504  Resp 20 02/10/21 1504  SpO2 98 % 02/10/21 1504  Vitals shown include unvalidated device data.  Last Pain:  Vitals:   02/10/21 1023  TempSrc: Temporal  PainSc: 2          Complications: No notable events documented.

## 2021-02-10 NOTE — Evaluation (Signed)
Physical Therapy Evaluation Patient Details Name: Regina Blanchard MRN: [id number] DOB: [birth year] Today's Date: 02/10/2021  History of Present Illness  Pt is a 70 y.o. female s/p L TKA 02/10/21.  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  Clinical Impression  Prior to hospital admission, pt was independent with ambulation; lives with her husband in 1 level home with 1 STE (no railing); has 24/7 assist available from her husband and family.  Currently pt is min assist semi-supine to sitting edge of bed; mod assist to stand from bed; and CGA to min assist to ambulate a few feet bed to recliner with RW.  Limited activity d/t pt becoming nauseas during sessions activities (gradually resolved with sitting rest and cold wash cloth to face; nurse notified).  No L knee buckling noted during standing activities.  4-5/10 L knee pain during sessions activities.  L knee AROM flexion at least 65 degrees.  Pt would benefit from skilled PT to address noted impairments and functional limitations (see below for any additional details).  Upon hospital discharge, pt would benefit from [place].       Recommendations for follow up therapy are one component of a multi-disciplinary discharge planning process, led by the attending physician.  Recommendations may be updated based on patient status, additional functional criteria and insurance authorization.  Follow Up Recommendations Home health PT    Equipment Recommendations  Rolling walker with 5" wheels    Recommendations for Other Services       Precautions / Restrictions Precautions Precautions: Fall;Knee Precaution Booklet Issued: Yes (comment) Precaution Comments: KI if unable to perform SLR; L hemovac Required Braces or Orthoses: Knee Immobilizer - Left Restrictions Weight Bearing Restrictions: Yes LLE Weight Bearing: Weight bearing as tolerated      Mobility  Bed  Mobility Overal bed mobility: Needs Assistance Bed Mobility: Supine to Sit     Supine to sit: Min assist;HOB elevated     General bed mobility comments: assist for L LE; vc's for technique    Transfers Overall transfer level: Needs assistance Equipment used: Rolling walker (2 wheeled) Transfers: Sit to/from Stand Sit to Stand: Mod assist         General transfer comment: vc's for UE/LE placement; assist to initiate stand from bed up to RW and control descent sitting in recliner  Ambulation/Gait Ambulation/Gait assistance: Min guard;Min assist Gait Distance (Feet): 3 Feet (bed to recliner) Assistive device: Rolling walker (2 wheeled) Gait Pattern/deviations: Antalgic Gait velocity: decreased   General Gait Details: decreased stance time L LE; vc's for increasing UE support through RW to offweight L LE  Stairs            Wheelchair Mobility    Modified Rankin (Stroke Patients Only)       Balance Overall balance assessment: Needs assistance Sitting-balance support: No upper extremity supported;Feet supported Sitting balance-Leahy Scale: Normal Sitting balance - Comments: steady sitting reaching outside BOS   Standing balance support: Single extremity supported Standing balance-Leahy Scale: Fair Standing balance comment: steady standing with at least single UE support (static)                             Pertinent Vitals/Pain Pain Assessment: 0-10 Pain Score: 4  Pain Location: R knee Pain Descriptors / Indicators: Sore;Tender Pain Intervention(s): Limited activity within patient's tolerance;Monitored during session;Patient requesting pain meds-RN notified;RN gave pain meds during  session;Ice applied (polar care applied and activated) Vitals (HR and O2 on room air) stable and WFL throughout treatment session.    Home Living Family/patient expects to be discharged to:: Private residence Living Arrangements: Spouse/significant other Available  Help at Discharge: Family;Available 24 hours/day Type of Home: House Home Access: Stairs to enter Entrance Stairs-Rails: None Entrance Stairs-Number of Steps: 1 Home Layout: One level Home Equipment: Walker - 4 wheels      Prior Function Level of Independence: Independent         Comments: No recent falls.     Hand Dominance        Extremity/Trunk Assessment   Upper Extremity Assessment Upper Extremity Assessment: Overall WFL for tasks assessed    Lower Extremity Assessment Lower Extremity Assessment: LLE deficits/detail LLE Deficits / Details: min assist for L LE SLR; good L quad set isometric strength contraction LLE: Unable to fully assess due to pain    Cervical / Trunk Assessment Cervical / Trunk Assessment: Normal  Communication   Communication: No difficulties  Cognition Arousal/Alertness: Awake/alert Behavior During Therapy: WFL for tasks assessed/performed Overall Cognitive Status: Within Functional Limits for tasks assessed                                        General Comments General comments (skin integrity, edema, etc.): L knee hemovac in place.  Nursing cleared pt for participation in physical therapy.  Pt agreeable to PT session.  Pt's husband present during session.    Exercises Total Joint Exercises Ankle Circles/Pumps: AROM;Strengthening;Both;10 reps;Supine Quad Sets: AROM;Strengthening;Both;10 reps;Supine Heel Slides: AAROM;Strengthening;Left;10 reps;Supine Hip ABduction/ADduction: AAROM;Strengthening;Left;10 reps;Supine Straight Leg Raises: AAROM;Strengthening;Left;10 reps;Supine Goniometric ROM: L knee AROM 0-65 degrees (limited ROM d/t L knee pain and nausea)   Assessment/Plan    PT Assessment Patient needs continued PT services  PT Problem List Decreased strength;Decreased range of motion;Decreased activity tolerance;Decreased balance;Decreased mobility;Decreased knowledge of use of DME;Decreased knowledge of  precautions;Pain;Decreased skin integrity       PT Treatment Interventions DME instruction;Gait training;Stair training;Functional mobility training;Therapeutic activities;Therapeutic exercise;Balance training;Patient/family education    PT Goals (Current goals can be found in the Care Plan section)  Acute Rehab PT Goals Patient Stated Goal: to go home PT Goal Formulation: With patient Time For Goal Achievement: 02/24/21 Potential to Achieve Goals: Good    Frequency BID   Barriers to discharge        Co-evaluation               AM-PAC PT "6 Clicks" Mobility  Outcome Measure Help needed turning from your back to your side while in a flat bed without using bedrails?: None Help needed moving from lying on your back to sitting on the side of a flat bed without using bedrails?: A Little Help needed moving to and from a bed to a chair (including a wheelchair)?: A Little Help needed standing up from a chair using your arms (e.g., wheelchair or bedside chair)?: A Lot Help needed to walk in hospital room?: A Little Help needed climbing 3-5 steps with a railing? : A Lot 6 Click Score: 17    End of Session Equipment Utilized During Treatment: Gait belt Activity Tolerance: Other (comment) (Limited d/t nausea with activity) Patient left: in chair;with call bell/phone within reach;with chair alarm set;with family/visitor present;with SCD's reapplied;Other (comment) (B heels floating via towel rolls; polar care in place and activated) Nurse Communication: Mobility  status;Precautions;Weight bearing status;Other (comment) (pt's pain status and nausea) PT Visit Diagnosis: Other abnormalities of gait and mobility (R26.89);Pain Pain - Right/Left: Left Pain - part of body: Knee    Time: 1700-1738 PT Time Calculation (min) (ACUTE ONLY): 38 min   Charges:   PT Evaluation $PT Eval Low Complexity: 1 Low PT Treatments $Therapeutic Exercise: 8-22 mins $Therapeutic Activity: 8-22 mins        Leitha Bleak, PT 02/10/21, 6:02 PM

## 2021-02-10 NOTE — Anesthesia Preprocedure Evaluation (Signed)
Anesthesia Evaluation  Patient identified by MRN, date of birth, ID band Patient awake    Reviewed: Allergy & Precautions, H&P , NPO status , Patient's Chart, lab work & pertinent test results, reviewed documented beta blocker date and time   History of Anesthesia Complications (+) PONV, Family history of anesthesia reaction and history of anesthetic complications  Airway Mallampati: I  TM Distance: >3 FB Neck ROM: full    Dental  (+) Caps, Dental Advidsory Given Permanent bridge:   Pulmonary neg pulmonary ROS,    Pulmonary exam normal        Cardiovascular Exercise Tolerance: Good hypertension, (-) angina(-) CAD, (-) Past MI, (-) Cardiac Stents and (-) CABG Normal cardiovascular exam(-) dysrhythmias (-) Valvular Problems/Murmurs     Neuro/Psych negative neurological ROS  negative psych ROS   GI/Hepatic negative GI ROS, Neg liver ROS,   Endo/Other  negative endocrine ROS  Renal/GU negative Renal ROS  negative genitourinary   Musculoskeletal  (+) Arthritis ,   Abdominal   Peds  Hematology negative hematology ROS (+)   Anesthesia Other Findings Past Medical History: No date: Back pain No date: Breast cancer (HCC)     Comment:  right breast cancer right: Cancer ([place])     Comment:  positive 2004 No date: Cervical polyp No date: Degenerative arthritis of right knee No date: Diverticulitis No date: Family history of adverse reaction to anesthesia     Comment:  brother became combative in post op No date: Pneumonia     Comment:  as a child "drank kerosine when a baby" No date: PONV (postoperative nausea and vomiting) No date: Status post radiation therapy     Comment:  2004 breast cancer No date: Vitamin D deficiency   Reproductive/Obstetrics negative OB ROS                             Anesthesia Physical  Anesthesia Plan  ASA: 2  Anesthesia Plan: Spinal   Post-op Pain  Management:    Induction:   PONV Risk Score and Plan: 3 and Propofol infusion and TIVA  Airway Management Planned: Natural Airway and Nasal Cannula  Additional Equipment:   Intra-op Plan:   Post-operative Plan:   Informed Consent: I have reviewed the patients History and Physical, chart, labs and discussed the procedure including the risks, benefits and alternatives for the proposed anesthesia with the patient or authorized representative who has indicated his/her understanding and acceptance.     Dental Advisory Given  Plan Discussed with: Anesthesiologist, CRNA and Surgeon  Anesthesia Plan Comments: (Patient reports no bleeding problems and no anticoagulant use.  Plan for spinal with backup GA  Patient consented for risks of anesthesia including but not limited to:  - adverse reactions to medications - damage to eyes, teeth, lips or other oral mucosa - nerve damage due to positioning  - risk of bleeding, infection and or nerve damage from spinal that could lead to paralysis - risk of headache or failed spinal - damage to teeth, lips or other oral mucosa - sore throat or hoarseness - damage to heart, brain, nerves, lungs, other parts of body or loss of life  Patient voiced understanding.)        Anesthesia Quick Evaluation

## 2021-02-10 NOTE — Op Note (Signed)
OPERATIVE NOTE  DATE OF SURGERY:  02/10/2021  PATIENT NAME:  Regina Blanchard   DOB: 11/18/1950  MRN: [id number]  PRE-OPERATIVE DIAGNOSIS: Degenerative arthrosis of the left knee, primary  POST-OPERATIVE DIAGNOSIS:  Same  PROCEDURE:  Left total knee arthroplasty using computer-assisted navigation  SURGEON:  Marciano Sequin. M.D.  ASSISTANT: Cassell Smiles, PA-C (present and scrubbed throughout the case, critical for assistance with exposure, retraction, instrumentation, and closure)  ANESTHESIA: spinal  ESTIMATED BLOOD LOSS: 50 mL  FLUIDS REPLACED: 110 mL of crystalloid  TOURNIQUET TIME: 87 minutes  DRAINS: 2 medium Hemovac drains  SOFT TISSUE RELEASES: Anterior cruciate ligament, posterior cruciate ligament, deep medial collateral ligament, patellofemoral ligament  IMPLANTS UTILIZED: DePuy Attune size 6N posterior stabilized femoral component (cemented), size 6 rotating platform tibial component (cemented), 38 mm medialized dome patella (cemented), and a 5 mm stabilized rotating platform polyethylene insert.  INDICATIONS FOR SURGERY: Regina Blanchard is a 70 y.o. year old female with a long history of progressive knee pain. X-rays demonstrated severe degenerative changes in tricompartmental fashion. The patient had not seen any significant improvement despite conservative nonsurgical intervention. After discussion of the risks and benefits of surgical intervention, the patient expressed understanding of the risks benefits and agree with plans for total knee arthroplasty.   The risks, benefits, and alternatives were discussed at length including but not limited to the risks of infection, bleeding, nerve injury, stiffness, blood clots, the need for revision surgery, cardiopulmonary complications, among others, and they were willing to proceed.  PROCEDURE IN DETAIL: The patient was brought into the operating room and, after adequate spinal anesthesia was achieved, a tourniquet was placed  on the patient's upper thigh. The patient's knee and leg were cleaned and prepped with alcohol and DuraPrep and draped in the usual sterile fashion. A "timeout" was performed as per usual protocol. The lower extremity was exsanguinated using an Esmarch, and the tourniquet was inflated to 300 mmHg. An anterior longitudinal incision was made followed by a standard mid vastus approach. The deep fibers of the medial collateral ligament were elevated in a subperiosteal fashion off of the medial flare of the tibia so as to maintain a continuous soft tissue sleeve. The patella was subluxed laterally and the patellofemoral ligament was incised. Inspection of the knee demonstrated severe degenerative changes with full-thickness loss of articular cartilage. Osteophytes were debrided using a rongeur. Anterior and posterior cruciate ligaments were excised. Two 4.0 mm Schanz pins were inserted in the femur and into the tibia for attachment of the array of trackers used for computer-assisted navigation. Hip center was identified using a circumduction technique. Distal landmarks were mapped using the computer. The distal femur and proximal tibia were mapped using the computer. The distal femoral cutting guide was positioned using computer-assisted navigation so as to achieve a 5 distal valgus cut. The femur was sized and it was felt that a size 6N femoral component was appropriate. A size 6 femoral cutting guide was positioned and the anterior cut was performed and verified using the computer. This was followed by completion of the posterior and chamfer cuts. Femoral cutting guide for the central box was then positioned in the center box cut was performed.  Attention was then directed to the proximal tibia. Medial and lateral menisci were excised. The extramedullary tibial cutting guide was positioned using computer-assisted navigation so as to achieve a 0 varus-valgus alignment and 3 posterior slope. The cut was performed  and verified using the computer. The proximal tibia was  sized and it was felt that a size 6 tibial tray was appropriate. Tibial and femoral trials were inserted followed by insertion of a 5 mm polyethylene insert. This allowed for excellent mediolateral soft tissue balancing both in flexion and in full extension. Finally, the patella was cut and prepared so as to accommodate a 38 mm medialized dome patella. A patella trial was placed and the knee was placed through a range of motion with excellent patellar tracking appreciated. The femoral trial was removed after debridement of posterior osteophytes. The central post-hole for the tibial component was reamed followed by insertion of a keel punch. Tibial trials were then removed. Cut surfaces of bone were irrigated with copious amounts of normal saline using pulsatile lavage and then suctioned dry. Polymethylmethacrylate cement was prepared in the usual fashion using a vacuum mixer. Cement was applied to the cut surface of the proximal tibia as well as along the undersurface of a size 6 rotating platform tibial component. Tibial component was positioned and impacted into place. Excess cement was removed using [employer] fast streamer. Cement was then applied to the cut surfaces of the femur as well as along the posterior flanges of the size 6N femoral component. The femoral component was positioned and impacted into place. Excess cement was removed using [employer] fast streamer. A 5 mm polyethylene trial was inserted and the knee was brought into full extension with steady axial compression applied. Finally, cement was applied to the backside of a 38 mm medialized dome patella and the patellar component was positioned and patellar clamp applied. Excess cement was removed using [employer] fast streamer. After adequate curing of the cement, the tourniquet was deflated after a total tourniquet time of 87 minutes. Hemostasis was achieved using electrocautery. The knee was irrigated with copious  amounts of normal saline using pulsatile lavage followed by 500 ml of Surgiphor and then suctioned dry. 20 mL of 1.3% Exparel and 60 mL of 0.25% Marcaine in 40 mL of normal saline was injected along the posterior capsule, medial and lateral gutters, and along the arthrotomy site. A 5 mm stabilized rotating platform polyethylene insert was inserted and the knee was placed through a range of motion with excellent mediolateral soft tissue balancing appreciated and excellent patellar tracking noted. 2 medium drains were placed in the wound bed and brought out through separate stab incisions. The medial parapatellar portion of the incision was reapproximated using interrupted sutures of #1 Vicryl. Subcutaneous tissue was approximated in layers using first #0 Vicryl followed #2-0 Vicryl. The skin was approximated with skin staples. A sterile dressing was applied.  The patient tolerated the procedure well and was transported to the recovery room in stable condition.    Eythan Jayne P. Holley Bouche., M.D.

## 2021-02-10 NOTE — H&P (Signed)
The patient has been re-examined, and the chart reviewed, and there have been no interval changes to the documented history and physical.    The risks, benefits, and alternatives have been discussed at length. The patient expressed understanding of the risks benefits and agreed with plans for surgical intervention.  Regina Blanchard, Jr. M.D.    

## 2021-02-10 NOTE — Anesthesia Procedure Notes (Signed)
Spinal  Patient location during procedure: OR Start time: 02/10/2021 11:20 AM End time: 02/10/2021 11:24 AM Reason for block: surgical anesthesia Staffing Performed: resident/CRNA  Anesthesiologist: Piscitello, Precious Haws, MD Resident/CRNA: Aline Brochure, CRNA Preanesthetic Checklist Completed: patient identified, IV checked, site marked, risks and benefits discussed, surgical consent, monitors and equipment checked, pre-op evaluation and timeout performed Spinal Block Patient position: sitting Prep: Betadine Patient monitoring: heart rate, continuous pulse ox, blood pressure and cardiac monitor Approach: midline Location: L4-5 Injection technique: single-shot Needle Needle type: Whitacre and Introducer  Needle gauge: 24 G Needle length: 9 cm Assessment Events: CSF return Additional Notes Negative paresthesia. Negative blood return. Positive free-flowing CSF. Expiration date of kit checked and confirmed. Patient tolerated procedure well, without complications.

## 2021-02-11 ENCOUNTER — Encounter: Payer: Self-pay | Admitting: Orthopedic Surgery

## 2021-02-11 DIAGNOSIS — M1712 Unilateral primary osteoarthritis, left knee: Secondary | ICD-10-CM | POA: Diagnosis not present

## 2021-02-11 MED ORDER — ENOXAPARIN SODIUM 40 MG/0.4ML IJ SOSY: 40.0000 mg | PREFILLED_SYRINGE | INTRAMUSCULAR | 0 refills | Status: DC

## 2021-02-11 MED ORDER — OXYCODONE HCL 5 MG PO TABS: 5.0000 mg | ORAL_TABLET | ORAL | 0 refills | Status: DC | PRN

## 2021-02-11 MED ORDER — BISACODYL 5 MG PO TBEC
5.0000 mg | DELAYED_RELEASE_TABLET | Freq: Every day | ORAL | Status: DC | PRN
  Administered 2021-02-11: 5 mg via ORAL
  Filled 2021-02-11: qty 1

## 2021-02-11 MED ORDER — TRAMADOL HCL 50 MG PO TABS: 50.0000 mg | ORAL_TABLET | ORAL | 0 refills | Status: DC | PRN

## 2021-02-11 MED ORDER — CELECOXIB 200 MG PO CAPS: 200.0000 mg | ORAL_CAPSULE | Freq: Two times a day (BID) | ORAL | 0 refills | Status: DC

## 2021-02-11 NOTE — Progress Notes (Signed)
  Subjective: 1 Day Post-Op Procedure(s) (LRB): COMPUTER ASSISTED TOTAL KNEE ARTHROPLASTY (Left) Patient reports pain as well-controlled.   Patient is well, but has had some minor complaints of nausea/vomiting Plan is to go Home after hospital stay. Negative for chest pain and shortness of breath Fever: no Gastrointestinal: positive for nausea, no vomiting.  Patient has not had a bowel movement.  Objective: Vital signs in last 24 hours: Temp:  [96.9 F (36.1 C)-97.9 F (36.6 C)] 97.8 F (36.6 C) (09/20 0742) Pulse Rate:  [66-82] 73 (09/20 0742) Resp:  [7-25] 15 (09/20 0742) BP: (100-165)/(60-115) 148/67 (09/20 0742) SpO2:  [90 %-100 %] 99 % (09/20 0742) Weight:  [88 kg] 88 kg (09/19 1023)  Intake/Output from previous day:  Intake/Output Summary (Last 24 hours) at 02/11/2021 0811 Last data filed at 02/11/2021 0500 Gross per 24 hour  Intake 1250.67 ml  Output 1505 ml  Net -254.33 ml    Intake/Output this shift: No intake/output data recorded.  Labs: No results for input(s): HGB in the last 72 hours. No results for input(s): WBC, RBC, HCT, PLT in the last 72 hours. No results for input(s): NA, K, CL, CO2, BUN, CREATININE, GLUCOSE, CALCIUM in the last 72 hours. No results for input(s): LABPT, INR in the last 72 hours.   EXAM General - Patient is Alert, Appropriate, and Oriented Extremity - Neurovascular intact Dorsiflexion/Plantar flexion intact Compartment soft Dressing/Incision -Postoperative dressing remains in place., Polar Care in place and working. , Hemovac in place.  Motor Function - intact, moving foot and toes well on exam. Able to perform SLR with assistance.   Cardiovascular- Regular rate and rhythm, no murmurs/rubs/gallops Respiratory- Lungs clear to auscultation bilaterally Gastrointestinal- soft, nontender, and active bowel sounds   Assessment/Plan: 1 Day Post-Op Procedure(s) (LRB): COMPUTER ASSISTED TOTAL KNEE ARTHROPLASTY (Left) Active Problems:    Total knee replacement status  Estimated body mass index is 29.5 kg/m as calculated from the following:   Height as of this encounter: 5\' 8"  (1.727 m).   Weight as of this encounter: 88 kg. Advance diet Up with therapy  Order placed for Dulcolax tabs as patient states she cannot tolerate Milk of Mag     DVT Prophylaxis - Lovenox, Ted hose, and foot pumps Weight-Bearing as tolerated to left leg  Cassell Smiles, PA-C [facility] 02/11/2021, 8:11 AM

## 2021-02-11 NOTE — Evaluation (Signed)
Occupational Therapy Evaluation Patient Details Name: Regina Blanchard MRN: [id number] DOB: 05-Nov-1950 Today's Date: 02/11/2021   History of Present Illness Pt is a 70 y.o. female s/p L TKA 02/10/21.  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   Clinical Impression   Pt seen for OT evaluation this date, POD#1 from above surgery. Pt was independent in all ADL prior to surgery, however endorsed having increased pain with prolonged standing or walking 2/2 L knee pain. Pt is eager to return to PLOF with less pain and improved safety and independence. Pt currently requires PRN minimal assist for LB dressing and bathing while in seated position due to pain and limited AROM of L knee. Pt/spouse instructed in home/routines modifications, falls prevention, pet care considerations, compression stocking mgt, polar care mgt, AE/DME, and RW mgt in kitchen vs bathroom. Handout provided. Pt/spouse verbalized understanding.  Do not currently anticipate any additional skilled OT needs at this time. Will sign off.       Recommendations for follow up therapy are one component of a multi-disciplinary discharge planning process, led by the attending physician.  Recommendations may be updated based on patient status, additional functional criteria and insurance authorization.   Follow Up Recommendations  No OT follow up    Equipment Recommendations  None recommended by OT    Recommendations for Other Services       Precautions / Restrictions Precautions Precautions: Fall;Knee Precaution Booklet Issued: Yes (comment) Precaution Comments: KI if unable to perform SLR; L hemovac Required Braces or Orthoses: Knee Immobilizer - Left Restrictions Weight Bearing Restrictions: Yes LLE Weight Bearing: Weight bearing as tolerated       ADL either performed or assessed with clinical judgement   ADL                                          General ADL Comments: Pt requires PRN MIN A for LB ADL, spouse able to provide needed level of assist, including for compression stockings, polar care.     Vision Baseline Vision/History: 1 Wears glasses Ability to See in Adequate Light: 0 Adequate       Perception     Praxis      Pertinent Vitals/Pain Pain Assessment: 0-10 Pain Score: 3  Pain Location: L knee Pain Descriptors / Indicators: Sore;Tender Pain Intervention(s): Limited activity within patient's tolerance;Monitored during session;Premedicated before session;Repositioned;Ice applied     Hand Dominance     Extremity/Trunk Assessment Upper Extremity Assessment Upper Extremity Assessment: Overall WFL for tasks assessed   Lower Extremity Assessment Lower Extremity Assessment: LLE deficits/detail LLE Deficits / Details: s/p TKA   Cervical / Trunk Assessment Cervical / Trunk Assessment: Normal   Communication Communication Communication: No difficulties   Cognition Arousal/Alertness: Awake/alert Behavior During Therapy: WFL for tasks assessed/performed Overall Cognitive Status: Within Functional Limits for tasks assessed                                     General Comments  L knee hemovac in place    Exercises  Other Exercises: Pt/spouse instructed in home/routines modifications, falls prevention, pet care considerations, compression stocking mgt, polar care mgt, AE/DME, and RW mgt in kitchen vs bathroom. Handout provided. Pt/spouse verbalized understanding.   Shoulder  Instructions      Home Living Family/patient expects to be discharged to:: Private residence Living Arrangements: Spouse/significant other Available Help at Discharge: Family;Available 24 hours/day Type of Home: House Home Access: Stairs to enter [employer] of Steps: 1 Entrance Stairs-Rails: None Home Layout: One level     Bathroom Shower/Tub: Tub/shower unit;Walk-in shower    Bathroom Toilet: Standard (with toilet riser)     Home Equipment: Walker - 4 wheels;Shower seat;Bedside commode          Prior Functioning/Environment Level of Independence: Independent        Comments: No recent falls. Difficulty with prolonged standing and walking.        OT Problem List: Decreased strength;Pain;Decreased range of motion      OT Treatment/Interventions:      OT Goals(Current goals can be found in the care plan section) Acute Rehab OT Goals Patient Stated Goal: to go home today OT Goal Formulation: All assessment and education complete, DC therapy  OT Frequency:     Barriers to D/C:            Co-evaluation              AM-PAC OT "6 Clicks" Daily Activity     Outcome Measure Help from another person eating meals?: None Help from another person taking care of personal grooming?: None Help from another person toileting, which includes using toliet, bedpan, or urinal?: None Help from another person bathing (including washing, rinsing, drying)?: A Little Help from another person to put on and taking off regular upper body clothing?: None Help from another person to put on and taking off regular lower body clothing?: A Little 6 Click Score: 22   End of Session    Activity Tolerance: Patient tolerated treatment well Patient left: in chair;with call bell/phone within reach;with chair alarm set;with family/visitor present;Other (comment);with SCD's reapplied (drain and polar care in place)  OT Visit Diagnosis: Other abnormalities of gait and mobility (R26.89);Pain Pain - Right/Left: Left Pain - part of body: Knee                Time: 6754-4920 OT Time Calculation (min): 15 min Charges:  OT General Charges $OT Visit: 1 Visit OT Evaluation $OT Eval Low Complexity: 1 Low OT Treatments $Self Care/Home Management : 8-22 mins  Ardeth Perfect., MPH, MS, OTR/L ascom [phone] 02/11/21, 12:30 PM

## 2021-02-11 NOTE — Progress Notes (Signed)
DISCHARGE NOTE:  Pt given discharge instructions and verbalized understanding. TED hose on both legs. Extra honeycomb dressing sent with pt. Walker sent with pt. Pt wheeled to car by staff. Husband providing transportation.

## 2021-02-11 NOTE — Discharge Summary (Signed)
Physician Discharge Summary  Patient ID: [place] SARINANA MRN: [id number] DOB/AGE: Nov 07, 1950 70 y.o.  Admit date: 02/10/2021 Discharge date: 02/11/2021  Admission Diagnoses:  Total knee replacement status [Z96.659]  Surgeries:Procedure(s):  Left total knee arthroplasty using computer-assisted navigation   SURGEON:  Marciano Sequin. M.D.   ASSISTANT: Cassell Smiles, PA-C (present and scrubbed throughout the case, critical for assistance with exposure, retraction, instrumentation, and closure)   ANESTHESIA: spinal   ESTIMATED BLOOD LOSS: 50 mL   FLUIDS REPLACED: 110 mL of crystalloid   TOURNIQUET TIME: 87 minutes   DRAINS: 2 medium Hemovac drains   SOFT TISSUE RELEASES: Anterior cruciate ligament, posterior cruciate ligament, deep medial collateral ligament, patellofemoral ligament   IMPLANTS UTILIZED: DePuy Attune size 6N posterior stabilized femoral component (cemented), size 6 rotating platform tibial component (cemented), 38 mm medialized dome patella (cemented), and a 5 mm stabilized rotating platform polyethylene insert.  Discharge Diagnoses: Patient Active Problem List   Diagnosis Date Noted   Total knee replacement status 02/10/2021   Chronic low back pain 02/05/2021   Status post total right knee replacement 07/06/2018   Hyperlipidemia, mixed 01/21/2018   Carcinoma in situ of right breast 01/27/2017   Fibrocystic breast disease 01/27/2017   Vitamin D deficiency, unspecified 01/08/2014    Past Medical History:  Diagnosis Date   Back pain    Breast cancer ([place]) 2004   right breast cancer   Cervical polyp    Degenerative arthritis of right knee    Diverticulosis    Family history of adverse reaction to anesthesia    brother became combative in post op   Pneumonia    as a child "drank kerosine when a baby"   PONV (postoperative nausea and vomiting)    Status post radiation therapy    2004 breast cancer   Vitamin D deficiency      Transfusion:     Consultants (if any):   Discharged Condition: Improved  Hospital Course: Regina Blanchard is an 70 y.o. female who was admitted 02/10/2021 with a diagnosis of left knee osteoarthritis and went to the operating room on 02/10/2021 and underwent left total knee arthoplasty. The patient received perioperative antibiotics for prophylaxis (see below). The patient tolerated the procedure well and was transported to PACU in stable condition. After meeting PACU criteria, the patient was subsequently transferred to the Orthopaedics/Rehabilitation unit.   The patient received DVT prophylaxis in the form of early mobilization, Lovenox, Foot Pumps, and TED hose. A sacral pad had been placed and heels were elevated off of the bed with rolled towels in order to protect skin integrity. Foley catheter was discontinued on postoperative day #0. Wound drains were discontinued on postoperative day #1. The surgical incision was healing well without signs of infection.  Physical therapy was initiated postoperatively for transfers, gait training, and strengthening. Occupational therapy was initiated for activities of daily living and evaluation for assisted devices. Rehabilitation goals were reviewed in detail with the patient. The patient made steady progress with physical therapy and physical therapy recommended discharge to Home.   The patient achieved the preliminary goals of this hospitalization and was felt to be medically and orthopaedically appropriate for discharge.  She was given perioperative antibiotics:  Anti-infectives (From admission, onward)    Start     Dose/Rate Route Frequency Ordered Stop   02/10/21 1730  ceFAZolin (ANCEF) IVPB 2g/100 mL premix        2 g 200 mL/hr over 30 Minutes Intravenous Every 6 hours 02/10/21  1633 02/11/21 0009   02/10/21 0939  ceFAZolin (ANCEF) 2-4 GM/100ML-% IVPB       Note to Pharmacy: Doreen Salvage   : cabinet override      02/10/21 0939 02/10/21 1132   02/10/21 0600   ceFAZolin (ANCEF) IVPB 2g/100 mL premix        2 g 200 mL/hr over 30 Minutes Intravenous On call to O.R. 02/10/21 5537 02/10/21 1141     .  Recent vital signs:  Vitals:   02/11/21 0742 02/11/21 1540  BP: (!) 148/67 135/67  Pulse: 73 71  Resp: 15 16  Temp: 97.8 F (36.6 C) 97.8 F (36.6 C)  SpO2: 99% 98%    Recent laboratory studies:  No results for input(s): WBC, HGB, HCT, PLT, K, CL, CO2, BUN, CREATININE, GLUCOSE, CALCIUM, LABPT, INR in the last 72 hours.  Diagnostic Studies: DG Knee Left Port  Result Date: 02/10/2021 CLINICAL DATA:  Knee replacement EXAM: PORTABLE LEFT KNEE - 1-2 VIEW COMPARISON:  None. FINDINGS: Status post left knee arthroplasty. Hardware appears well aligned without complicating feature. No acute fracture. Normal mineralization. Old screw fixation holes in the femur and tibia. No large knee joint effusion. Surgical drains and skin staples in place. IMPRESSION: Status post left knee arthroplasty without complicating feature. Electronically Signed   By: Albin Felling M.D.   On: 02/10/2021 15:43   DG Outside Films Extremity  Result Date: 02/10/2021 This examination belongs to an outside facility and is stored here for comparison purposes only.  Contact the originating outside institution for any associated report or interpretation.   Discharge Medications:   Allergies as of 02/11/2021   No Known Allergies      Medication List     STOP taking these medications    aspirin EC 81 MG tablet   ibuprofen 200 MG tablet Commonly known as: ADVIL       TAKE these medications    ALPRAZolam 0.25 MG tablet Commonly known as: XANAX Take 0.25 mg by mouth daily as needed for anxiety or sleep.   baclofen 10 MG tablet Commonly known as: LIORESAL Take 10 mg by mouth daily as needed for muscle spasms.   celecoxib 200 MG capsule Commonly known as: CELEBREX Take 1 capsule (200 mg total) by mouth 2 (two) times daily.   diclofenac Sodium 1 % Gel Commonly  known as: VOLTAREN Apply 2 g topically at bedtime as needed (pain).   enoxaparin 40 MG/0.4ML injection Commonly known as: LOVENOX Inject 0.4 mLs (40 mg total) into the skin daily for 14 days.   hydrochlorothiazide 25 MG tablet Commonly known as: HYDRODIURIL Take 25 mg by mouth daily as needed (swelling).   multivitamin capsule Take 2 capsules by mouth daily.   ondansetron 4 MG disintegrating tablet Commonly known as: ZOFRAN-ODT Take 4 mg by mouth every 8 (eight) hours as needed for nausea/vomiting.   oxyCODONE 5 MG immediate release tablet Commonly known as: Oxy IR/ROXICODONE Take 1 tablet (5 mg total) by mouth every 4 (four) hours as needed for moderate pain (pain score 4-6).   tiZANidine 4 MG tablet Commonly known as: ZANAFLEX Take 4 mg by mouth 2 (two) times daily as needed for muscle spasms.   traMADol 50 MG tablet Commonly known as: ULTRAM Take 1 tablet (50 mg total) by mouth every 4 (four) hours as needed for moderate pain.   Vitamin D 50 MCG (2000 UT) tablet Take 6,000 Units by mouth daily.  Durable Medical Equipment  (From admission, onward)           Start     Ordered   02/10/21 1634  DME Walker rolling  Once       Question:  Patient needs a walker to treat with the following condition  Answer:  Total knee replacement status   02/10/21 1633   02/10/21 1634  DME Bedside commode  Once       Question:  Patient needs a bedside commode to treat with the following condition  Answer:  Total knee replacement status   02/10/21 1633            Disposition: Home with home health PT     Follow-up Information     Fausto Skillern, PA-C Follow up on 02/25/2021.   Specialty: Orthopedic Surgery Why: at 1:45pm Contact information: [place] [address] [phone]         Dereck Leep, MD Follow up on 03/25/2021.   Specialty: Orthopedic Surgery Why: at  3:00pm Contact information: [place]  [id number] [place], PA-C 02/11/2021, 5:02 PM

## 2021-02-11 NOTE — Progress Notes (Signed)
Physical Therapy Treatment Patient Details Name: Regina Blanchard MRN: [id number] DOB: [birth year] Today's Date: 02/11/2021   History of Present Illness Pt is a 70 y.o. female s/p L TKA 02/10/21.  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    PT Comments    Pt was agreeable to PT treatment this afternoon and reporting 3/10 pain in L knee prior to therapy. Pt instructed on seated exercises prior to ambulation and demonstrates good understanding. Pt able to complete transfers and ambulate with CGA for 200 ft with RW + verbal cues to normalize gait pattern. Pt and spouse educated and demonstrated stair climbing with CGA and curb step to simulate home environment with proper guarding techniques and good safety awareness. RN and PA notified of patient status and progress via Secure Chat. Pt demonstrating safe mobility and will have good family support at discharge. Pt will continue to benefit from skilled PT services during hospitalization.    Recommendations for follow up therapy are one component of a multi-disciplinary discharge planning process, led by the attending physician.  Recommendations may be updated based on patient status, additional functional criteria and insurance authorization.  Follow Up Recommendations  Home health PT     Equipment Recommendations  Rolling walker with 5" wheels (RW delivered to patient room and fitted to proper height)    Recommendations for Other Services       Precautions / Restrictions Precautions Precautions: Fall;Knee Precaution Booklet Issued: Yes (comment) Precaution Comments: KI if unable to perform SLR; L hemovac Required Braces or Orthoses: Knee Immobilizer - Left Restrictions Weight Bearing Restrictions: Yes LLE Weight Bearing: Weight bearing as tolerated     Mobility  Bed Mobility Overal bed mobility: Needs Assistance Bed Mobility: Supine to Sit;Sit to  Supine     Supine to sit: Supervision Sit to supine: Supervision   General bed mobility comments: SPV/SBA for assistance wtih management of L LE but pt able to complete without physical assistance    Transfers Overall transfer level: Needs assistance Equipment used: Rolling walker (2 wheeled) Transfers: Sit to/from Stand Sit to Stand: Min guard         General transfer comment: CGA for balance during sit to stand. Pt demonstrating good carryover with pushing from bed surface  Ambulation/Gait Ambulation/Gait assistance: Min guard Gait Distance (Feet): 200 Feet Assistive device: Rolling walker (2 wheeled) Gait Pattern/deviations: Step-through pattern;Decreased stride length;Antalgic Gait velocity: decreased   General Gait Details: Pt able to improve gait pattern and gait speed with cues for continuous pushing of RW. Pt demonstrating increased knee flexion and terminal stance with verbal cues.   Stairs Stairs: Yes Stairs assistance: Min guard Stair Management: Two rails;Step to pattern;Forwards Number of Stairs: 4 x 2, 1 step x 2 General stair comments: Pt completed 4 steps x 2 with B handrails + verbal cues for sequencing and education for husband regarding proper guarding techniques. Trialed curb step x 2 with RW ascending and descending forward. Pt required verbal cues for sequencing and CGA for dynamic balance when advancing RW onto curb step. Pt's husband educated and demonstrated proper guarding techniques   Wheelchair Mobility    Modified Rankin (Stroke Patients Only)       Balance   Sitting-balance support: No upper extremity supported;Feet supported Sitting balance-Leahy Scale: Normal     Standing balance support: Bilateral upper extremity supported;During functional activity Standing balance-Leahy Scale: Good Standing balance comment: CGA for dynamic balance during  curb step training.             Cognition Arousal/Alertness: Awake/alert Behavior  During Therapy: WFL for tasks assessed/performed Overall Cognitive Status: Within Functional Limits for tasks assessed            General Comments: Pt agreeable to PT treatment today      Exercises Other exercises: Seated knee flexion 1 x 15, seated LAQ 1 x 15, Seated heel/toe raises 1 x 20    General Comments        Pertinent Vitals/Pain Pain Assessment: 0-10 Pain Score: 3  Pain Location: L knee Pain Descriptors / Indicators: Aching;Sore Pain Intervention(s): Limited activity within patient's tolerance;Monitored during session;Premedicated before session    [place] expects to be discharged to:: Private residence Living Arrangements: Spouse/significant other Available Help at Discharge: Family;Available 24 hours/day Type of Home: House Home Access: Stairs to enter Entrance Stairs-Rails: None Home Layout: One level Home Equipment: Environmental consultant - 4 wheels;Shower seat;Bedside commode      Prior Function Level of Independence: Independent      Comments: No recent falls. Difficulty with prolonged standing and walking.   PT Goals (current goals can now be found in the care plan section) Acute Rehab PT Goals Patient Stated Goal: to go home today PT Goal Formulation: With patient Time For Goal Achievement: 02/24/21 Potential to Achieve Goals: Good Progress towards PT goals: Progressing toward goals    Frequency    BID      PT Plan Current plan remains appropriate    Co-evaluation              AM-PAC PT "6 Clicks" Mobility   Outcome Measure  Help needed turning from your back to your side while in a flat bed without using bedrails?: None Help needed moving from lying on your back to sitting on the side of a flat bed without using bedrails?: A Little Help needed moving to and from a bed to a chair (including a wheelchair)?: A Little Help needed standing up from a chair using your arms (e.g., wheelchair or bedside chair)?: A Little Help needed  to walk in hospital room?: A Little Help needed climbing 3-5 steps with a railing? : A Little 6 Click Score: 19    End of Session Equipment Utilized During Treatment: Gait belt Activity Tolerance: Patient tolerated treatment well;No increased pain Patient left: in bed;with call bell/phone within reach;with bed alarm set;with family/visitor present;with SCD's reapplied;Other (comment) (Ice reapplied with bone foam under L heel) Nurse Communication: Mobility status PT Visit Diagnosis: Other abnormalities of gait and mobility (R26.89);Pain Pain - Right/Left: Left Pain - part of body: Knee     Time: 0383-3383 PT Time Calculation (min) (ACUTE ONLY): 53 min  Charges:                        Andrey Campanile, SPT    Andrey Campanile 02/11/2021, 4:01 PM

## 2021-02-11 NOTE — Anesthesia Postprocedure Evaluation (Signed)
Anesthesia Post Note  Patient: Regina Blanchard  Procedure(s) Performed: COMPUTER ASSISTED TOTAL KNEE ARTHROPLASTY (Left: Knee)  Patient location during evaluation: Nursing Unit Anesthesia Type: Spinal Level of consciousness: awake Pain management: pain level controlled Respiratory status: spontaneous breathing Cardiovascular status: stable Anesthetic complications: no   No notable events documented.   Last Vitals:  Vitals:   02/11/21 0415 02/11/21 0742  BP: 136/65 (!) 148/67  Pulse: 78 73  Resp: 17 15  Temp: 36.6 C 36.6 C  SpO2: 100% 99%    Last Pain:  Vitals:   02/11/21 0500  TempSrc:   PainSc: 2                  Briah Nary Lorenza Chick

## 2021-02-11 NOTE — Progress Notes (Signed)
Physical Therapy Treatment Patient Details Name: Regina Blanchard MRN: [id number] DOB: 02/06/1951 Today's Date: 02/11/2021   History of Present Illness Pt is a 70 y.o. female s/p L TKA 02/10/21.  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    PT Comments    Pt resting in bed upon PT arrival; agreeable to PT session.  SBA supine to sitting edge of bed; CGA with transfers; and CGA to ambulate 120 feet with RW (vc's for gait technique).  Pain 4/10 L knee beginning/end of session at rest.  Pt able to perform L LE SLR independently.  L knee AROM 0-90 degrees.  Will plan to progress mobility/ambulation and trial stairs this afternoon (pt reports hoping to discharge home today).    Recommendations for follow up therapy are one component of a multi-disciplinary discharge planning process, led by the attending physician.  Recommendations may be updated based on patient status, additional functional criteria and insurance authorization.  Follow Up Recommendations  Home health PT     Equipment Recommendations  Rolling walker with 5" wheels    Recommendations for Other Services       Precautions / Restrictions Precautions Precautions: Fall;Knee Precaution Booklet Issued: Yes (comment) Precaution Comments: KI if unable to perform SLR; L hemovac Required Braces or Orthoses: Knee Immobilizer - Left Restrictions Weight Bearing Restrictions: Yes LLE Weight Bearing: Weight bearing as tolerated     Mobility  Bed Mobility Overal bed mobility: Needs Assistance Bed Mobility: Supine to Sit     Supine to sit: Supervision     General bed mobility comments: bed flat; mild increased effort to perform on own    Transfers Overall transfer level: Needs assistance Equipment used: Rolling walker (2 wheeled) Transfers: Sit to/from Stand Sit to Stand: Min guard         General transfer comment: vc's for UE/LE  placement; mild increased effort of pt to initiate stand from bed up to RW and control descent sitting in recliner  Ambulation/Gait Ambulation/Gait assistance: Min guard;Supervision Gait Distance (Feet): 120 Feet Assistive device: Rolling walker (2 wheeled) Gait Pattern/deviations: Antalgic Gait velocity: decreased   General Gait Details: decreased stance time L LE; vc's for increasing UE support through RW to offweight L LE; step to gait pattern; vc's for L knee flexion during L LE swing phase and L heelstrike during initial contact   Stairs             Wheelchair Mobility    Modified Rankin (Stroke Patients Only)       Balance Overall balance assessment: Needs assistance Sitting-balance support: No upper extremity supported;Feet supported Sitting balance-Leahy Scale: Normal Sitting balance - Comments: steady sitting reaching outside BOS   Standing balance support: No upper extremity supported Standing balance-Leahy Scale: Fair Standing balance comment: steady static standing                            Cognition Arousal/Alertness: Awake/alert Behavior During Therapy: WFL for tasks assessed/performed Overall Cognitive Status: Within Functional Limits for tasks assessed                                        Exercises Total Joint Exercises Ankle Circles/Pumps: AROM;Strengthening;Both;10 reps;Supine Quad Sets: AROM;Strengthening;Both;10 reps;Supine Short Arc Quad: AROM;Strengthening;Left;10 reps;Supine Heel Slides: AAROM;Strengthening;Left;10 reps;Supine Hip ABduction/ADduction: AAROM;Strengthening;Left;10  reps;Supine Straight Leg Raises: AROM;Strengthening;Left;10 reps;Supine Goniometric ROM: L knee AROM 0-90 degrees    General Comments General comments (skin integrity, edema, etc.): L knee hemovac in place.      Pertinent Vitals/Pain Pain Assessment: 0-10 Pain Score: 4  Pain Location: L knee Pain Descriptors / Indicators:  Sore;Tender Pain Intervention(s): Limited activity within patient's tolerance;Monitored during session;Premedicated before session;Repositioned;Other (comment) (polar care applied and activated) Vitals (HR and O2 on room air) stable and WFL throughout treatment session.    Home Living                      Prior Function            PT Goals (current goals can now be found in the care plan section) Acute Rehab PT Goals Patient Stated Goal: to go home PT Goal Formulation: With patient Time For Goal Achievement: 02/24/21 Potential to Achieve Goals: Good Progress towards PT goals: Progressing toward goals    Frequency    BID      PT Plan Current plan remains appropriate    Co-evaluation              AM-PAC PT "6 Clicks" Mobility   Outcome Measure  Help needed turning from your back to your side while in a flat bed without using bedrails?: None Help needed moving from lying on your back to sitting on the side of a flat bed without using bedrails?: A Little Help needed moving to and from a bed to a chair (including a wheelchair)?: A Little Help needed standing up from a chair using your arms (e.g., wheelchair or bedside chair)?: A Little Help needed to walk in hospital room?: A Little Help needed climbing 3-5 steps with a railing? : A Little 6 Click Score: 19    End of Session Equipment Utilized During Treatment: Gait belt Activity Tolerance: Patient tolerated treatment well Patient left: in chair;with call bell/phone within reach;with chair alarm set;with SCD's reapplied;Other (comment) (B heels floating via towel rolls; polar care in place and activated) Nurse Communication: Mobility status;Precautions;Weight bearing status;Other (comment) (pt's pain status) PT Visit Diagnosis: Other abnormalities of gait and mobility (R26.89);Pain Pain - Right/Left: Left Pain - part of body: Knee     Time: 6286-3817 PT Time Calculation (min) (ACUTE ONLY): 49  min  Charges:  $Gait Training: 8-22 mins $Therapeutic Exercise: 8-22 mins $Therapeutic Activity: 8-22 mins                    Sadie Pickar, PT 02/11/21, 10:08 AM

## 2021-02-11 NOTE — TOC Progression Note (Signed)
Transition of Care [facility]) - Progression Note    Patient Details  Name: Regina Blanchard MRN: [id number] Date of Birth: 1951-02-09  [facility]) CM/SW [place], RN Phone Number: 02/11/2021, 3:38 PM  Clinical Narrative:   Patient anticipates discharge later today.  She lives at home with her spouse.  [facility] will accept patient, arranged prior to this admission, confirmed by [place].  Patient had DME delivered by [employer] earlier today. TOC contact informaiton give n for further questions.    Expected Discharge Plan: [place] Barriers to Discharge: Barriers Resolved  Expected Discharge Plan and Services Expected Discharge Plan: [place]   Discharge Planning Services: CM Consult Post Acute Care Choice: Durable Medical Equipment, Home Health Living arrangements for the past 2 months: Single Family Home                 DME Arranged: 3-N-1, Gilford Rile DME Agency: [employer] Date DME Agency Contacted: 02/11/21   Representative spoke with at DME Agency: Suanne Marker HH Arranged: PT, OT [facility] Agency: [place] Date [place]: 02/11/21   Representative spoke with at [place]: Pre [place] by MD office-Confirmed per [place]   Social Determinants of Health ([place]) Interventions    Readmission Risk Interventions No flowsheet data found.

## 2021-02-11 NOTE — Progress Notes (Signed)
Post-op dressing removed. , Hemovac removed., and Mini compression dressing applied.    

## 2021-03-03 ENCOUNTER — Other Ambulatory Visit: Payer: Self-pay | Admitting: Internal Medicine

## 2021-03-03 DIAGNOSIS — Z1231 Encounter for screening mammogram for malignant neoplasm of breast: Secondary | ICD-10-CM

## 2021-03-19 ENCOUNTER — Ambulatory Visit
Admission: RE | Admit: 2021-03-19 | Discharge: 2021-03-19 | Disposition: A | Discharge status: Home / Self Care | Payer: Medicare PPO | Source: Ambulatory Visit | Attending: Internal Medicine | Admitting: Internal Medicine

## 2021-03-19 ENCOUNTER — Other Ambulatory Visit: Payer: Self-pay

## 2021-03-19 DIAGNOSIS — Z1231 Encounter for screening mammogram for malignant neoplasm of breast: Secondary | ICD-10-CM | POA: Insufficient documentation

## 2021-03-26 ENCOUNTER — Other Ambulatory Visit: Payer: Self-pay | Admitting: Internal Medicine

## 2021-03-26 DIAGNOSIS — N6489 Other specified disorders of breast: Secondary | ICD-10-CM

## 2021-03-26 DIAGNOSIS — R928 Other abnormal and inconclusive findings on diagnostic imaging of breast: Secondary | ICD-10-CM

## 2021-04-09 ENCOUNTER — Ambulatory Visit
Admission: RE | Admit: 2021-04-09 | Discharge: 2021-04-09 | Disposition: A | Discharge status: Home / Self Care | Payer: Medicare PPO | Source: Ambulatory Visit | Attending: Internal Medicine | Admitting: Internal Medicine

## 2021-04-09 ENCOUNTER — Other Ambulatory Visit: Payer: Self-pay

## 2021-04-09 DIAGNOSIS — R928 Other abnormal and inconclusive findings on diagnostic imaging of breast: Secondary | ICD-10-CM | POA: Diagnosis present

## 2021-04-09 DIAGNOSIS — N6489 Other specified disorders of breast: Secondary | ICD-10-CM | POA: Insufficient documentation

## 2021-04-10 ENCOUNTER — Other Ambulatory Visit: Payer: Self-pay | Admitting: Internal Medicine

## 2021-04-10 DIAGNOSIS — R928 Other abnormal and inconclusive findings on diagnostic imaging of breast: Secondary | ICD-10-CM

## 2021-04-10 DIAGNOSIS — N6310 Unspecified lump in the right breast, unspecified quadrant: Secondary | ICD-10-CM

## 2021-04-29 ENCOUNTER — Ambulatory Visit
Admission: RE | Admit: 2021-04-29 | Discharge: 2021-04-29 | Disposition: A | Discharge status: Home / Self Care | Payer: Medicare PPO | Source: Ambulatory Visit | Attending: Internal Medicine | Admitting: Internal Medicine

## 2021-04-29 ENCOUNTER — Other Ambulatory Visit: Payer: Self-pay

## 2021-04-29 DIAGNOSIS — N6310 Unspecified lump in the right breast, unspecified quadrant: Secondary | ICD-10-CM | POA: Diagnosis present

## 2021-04-29 DIAGNOSIS — R928 Other abnormal and inconclusive findings on diagnostic imaging of breast: Secondary | ICD-10-CM

## 2021-04-29 HISTORY — PX: BREAST BIOPSY: SHX20

## 2021-05-01 ENCOUNTER — Other Ambulatory Visit: Payer: Self-pay | Admitting: *Deleted

## 2021-05-01 ENCOUNTER — Encounter: Payer: Self-pay | Admitting: *Deleted

## 2021-05-01 DIAGNOSIS — C50911 Malignant neoplasm of unspecified site of right female breast: Secondary | ICD-10-CM

## 2021-05-01 NOTE — Progress Notes (Signed)
Received message from Stacie Acres, RN at [facility] that patient had been notified of her new diagnosis of breast cancer and was ready for navigation to call her.  Called patient.  States she has a history of right breast cancer in 2004.  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  She had a lumpectomy, radiation therapy and was on a NSABP clinical trial using either Tamoxifen or Arimidex.  Patient states she thinks she was on the Tamoxifen arm.  Patient is scheduled to see Dr. Grayland Ormond on 05/02/21 @ 9:00 and then will see Dr. Lysle Pearl on 05/05/21 for surgical consultation.  She was encouraged to call with any questions or needs.

## 2021-05-02 ENCOUNTER — Inpatient Hospital Stay: Payer: Medicare PPO | Attending: Oncology | Admitting: Oncology

## 2021-05-02 ENCOUNTER — Other Ambulatory Visit: Payer: Self-pay

## 2021-05-02 ENCOUNTER — Encounter: Payer: Self-pay | Admitting: *Deleted

## 2021-05-02 ENCOUNTER — Inpatient Hospital Stay: Payer: Medicare PPO

## 2021-05-02 ENCOUNTER — Encounter: Payer: Self-pay | Admitting: Oncology

## 2021-05-02 DIAGNOSIS — N6315 Unspecified lump in the right breast, overlapping quadrants: Secondary | ICD-10-CM | POA: Diagnosis not present

## 2021-05-02 DIAGNOSIS — C50911 Malignant neoplasm of unspecified site of right female breast: Secondary | ICD-10-CM

## 2021-05-02 DIAGNOSIS — Z833 Family history of diabetes mellitus: Secondary | ICD-10-CM | POA: Insufficient documentation

## 2021-05-02 DIAGNOSIS — Z803 Family history of malignant neoplasm of breast: Secondary | ICD-10-CM | POA: Diagnosis not present

## 2021-05-02 DIAGNOSIS — Z79899 Other long term (current) drug therapy: Secondary | ICD-10-CM | POA: Diagnosis not present

## 2021-05-02 DIAGNOSIS — Z79811 Long term (current) use of aromatase inhibitors: Secondary | ICD-10-CM | POA: Diagnosis not present

## 2021-05-02 DIAGNOSIS — N6489 Other specified disorders of breast: Secondary | ICD-10-CM | POA: Diagnosis not present

## 2021-05-02 DIAGNOSIS — Z923 Personal history of irradiation: Secondary | ICD-10-CM | POA: Diagnosis not present

## 2021-05-02 DIAGNOSIS — Z170 Estrogen receptor positive status [ER+]: Secondary | ICD-10-CM | POA: Diagnosis not present

## 2021-05-02 DIAGNOSIS — Z8249 Family history of ischemic heart disease and other diseases of the circulatory system: Secondary | ICD-10-CM | POA: Diagnosis not present

## 2021-05-02 DIAGNOSIS — Z800 Family history of malignant neoplasm of digestive organs: Secondary | ICD-10-CM | POA: Diagnosis not present

## 2021-05-02 DIAGNOSIS — C50411 Malignant neoplasm of upper-outer quadrant of right female breast: Secondary | ICD-10-CM | POA: Diagnosis not present

## 2021-05-02 DIAGNOSIS — Z7901 Long term (current) use of anticoagulants: Secondary | ICD-10-CM | POA: Diagnosis not present

## 2021-05-02 NOTE — Progress Notes (Signed)
Met patient and her husband today during her initial medical oncology consult with Dr. Grayland Ormond.  ER/PR and Her2 are still pending.  Final treatment plan after she see the surgeon on Monday.  Patient to call me back with her surgery date so I can schedule her post surgery appointment with Dr. Grayland Ormond.  Gave patient breast cancer educational literature, "My Breast Cancer Treatment Handbook" by Josephine Igo, RN.

## 2021-05-02 NOTE — Progress Notes (Signed)
Pt would like to know prognosis and treatment plan. No concerns/complaints at this time.

## 2021-05-04 DIAGNOSIS — C50911 Malignant neoplasm of unspecified site of right female breast: Secondary | ICD-10-CM | POA: Insufficient documentation

## 2021-05-04 NOTE — Progress Notes (Signed)
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  INTERVAL HISTORY: Is a 70 year old female with a distant history of right breast DCIS who recently noted an abnormality on mammogram and ultrasound.  Subsequent biopsy revealed the above-stated malignancy.  She currently feels well and is asymptomatic.  She has no neurologic complaints.  She denies any recent fevers or illnesses.  She has a good appetite and denies weight loss.  She has no chest pain, shortness of breath, cough, or hemoptysis.  She denies any nausea, vomiting, constipation, or diarrhea.  She has no urinary complaints.  Patient feels at her baseline offers no specific complaints today.  REVIEW OF SYSTEMS:   Review of Systems  Constitutional: Negative.  Negative for fever, malaise/fatigue and weight loss.  Respiratory: Negative.  Negative for cough and shortness of breath.   Cardiovascular: Negative.  Negative for chest pain and leg swelling.  Gastrointestinal: Negative.  Negative for abdominal pain, blood in stool and melena.  Genitourinary:  Negative for dysuria.  Musculoskeletal: Negative.  Negative for back pain.  Skin: Negative.  Negative for rash.  Neurological: Negative.  Negative for dizziness, focal weakness, weakness and headaches.  Psychiatric/Behavioral: Negative.  The patient is not nervous/anxious.    As per HPI. Otherwise, a complete review of systems is negative.  PAST MEDICAL HISTORY: Past Medical History:  Diagnosis Date   Back pain    Breast cancer ([place]) 2004   right breast cancer   Cervical polyp    Degenerative arthritis of right knee    Diverticulosis    Family history of adverse reaction to anesthesia    brother became combative in post  op   Pneumonia    as a child "drank kerosine when a baby"   PONV (postoperative nausea and vomiting)    Status post radiation therapy    2004 breast cancer   Vitamin D deficiency     PAST SURGICAL HISTORY: Past Surgical History:  Procedure Laterality Date   BREAST BIOPSY Left    1991 negative 2006 negative   BREAST BIOPSY Bilateral    BREAST BIOPSY  04/29/2021   X2  12:00 heart, 12:30 coil, path pending   BREAST EXCISIONAL BIOPSY Right    positive 2004    BREAST LUMPECTOMY Right 2004   COLONOSCOPY     2008, 2011, 2022   DILATION AND CURETTAGE OF UTERUS     EXPLORATORY LAPAROTOMY  1991   infertility   KNEE ARTHROPLASTY Right 07/06/2018   Procedure: COMPUTER ASSISTED TOTAL KNEE ARTHROPLASTY;  Surgeon: Dereck Leep, MD;  Location: [facility] ORS;  Service: Orthopedics;  Laterality: Right;   KNEE ARTHROPLASTY Left 02/10/2021   Procedure: COMPUTER ASSISTED TOTAL KNEE ARTHROPLASTY;  Surgeon: Dereck Leep, MD;  Location: [facility] ORS;  Service: Orthopedics;  Laterality: Left;   SQUINT REPAIR Left 1960   age 65 (CROSS EYE SURGERY)    FAMILY HISTORY: Family History  Problem Relation Age of Onset   Breast cancer Maternal Aunt 93   Diabetes Mother    Heart disease Father    Diabetes Sister    Breast cancer Paternal Aunt    Colon cancer Maternal Aunt    Hypertension Maternal Aunt     ADVANCED DIRECTIVES (Y/N):  N  HEALTH MAINTENANCE: Social History  Tobacco Use   Smoking status: Never   Smokeless tobacco: Never  Vaping Use   Vaping Use: Never used  Substance Use Topics   Alcohol use: No   Drug use: No     Colonoscopy:  PAP:  Bone density:  Lipid panel:  No Known Allergies  Current Outpatient Medications  Medication Sig Dispense Refill   ALPRAZolam (XANAX) 0.25 MG tablet Take 0.25 mg by mouth daily as needed for anxiety or sleep.      aspirin EC 81 MG tablet Take 81 mg by mouth daily. Swallow whole.     baclofen (LIORESAL) 10 MG tablet Take 10 mg by mouth daily  as needed for muscle spasms.      Cholecalciferol (VITAMIN D) 50 MCG (2000 UT) tablet Take 6,000 Units by mouth daily.     hydrochlorothiazide (HYDRODIURIL) 25 MG tablet Take 25 mg by mouth daily as needed (swelling).     Multiple Vitamin (MULTIVITAMIN) capsule Take 2 capsules by mouth daily.     tiZANidine (ZANAFLEX) 4 MG tablet Take 4 mg by mouth 2 (two) times daily as needed for muscle spasms.     celecoxib (CELEBREX) 200 MG capsule Take 1 capsule (200 mg total) by mouth 2 (two) times daily. 90 capsule 0   diclofenac Sodium (VOLTAREN) 1 % GEL Apply 2 g topically at bedtime as needed (pain).     enoxaparin (LOVENOX) 40 MG/0.4ML injection Inject 0.4 mLs (40 mg total) into the skin daily for 14 days. 5.6 mL 0   ondansetron (ZOFRAN-ODT) 4 MG disintegrating tablet Take 4 mg by mouth every 8 (eight) hours as needed for nausea/vomiting. (Patient not taking: Reported on 02/10/2021)     oxyCODONE (OXY IR/ROXICODONE) 5 MG immediate release tablet Take 1 tablet (5 mg total) by mouth every 4 (four) hours as needed for moderate pain (pain score 4-6). 30 tablet 0   traMADol (ULTRAM) 50 MG tablet Take 1 tablet (50 mg total) by mouth every 4 (four) hours as needed for moderate pain. 30 tablet 0   No current facility-administered medications for this visit.    OBJECTIVE: Vitals:   05/02/21 0901  BP: (!) 142/98  Pulse: 93  Resp: 16  Temp: 97.8 F (36.6 C)  SpO2: 98%     Body mass index is 28.98 kg/m.    ECOG FS:0 - Asymptomatic  General: Well-developed, well-nourished, no acute distress. Eyes: Pink conjunctiva, anicteric sclera. HEENT: Normocephalic, moist mucous membranes. Breast: Exam deferred today. Lungs: No audible wheezing or coughing. Heart: Regular rate and rhythm. Abdomen: Soft, nontender, no obvious distention. Musculoskeletal: No edema, cyanosis, or clubbing. Neuro: Alert, answering all questions appropriately. Cranial nerves grossly intact. Skin: No rashes or petechiae noted. Psych:  Normal affect. Lymphatics: No cervical, calvicular, axillary or inguinal LAD.   LAB RESULTS:  Lab Results  Component Value Date   NA 139 06/22/2018   K 3.6 06/22/2018   CL 99 06/22/2018   CO2 32 06/22/2018   GLUCOSE 120 (H) 06/22/2018   BUN 19 06/22/2018   CREATININE 0.71 06/22/2018   CALCIUM 9.8 06/22/2018   PROT 7.2 06/22/2018   ALBUMIN 4.1 06/22/2018   AST 25 06/22/2018   ALT 25 06/22/2018   ALKPHOS 90 06/22/2018   BILITOT 1.0 06/22/2018   GFRNONAA >60 06/22/2018   GFRAA >60 06/22/2018    Lab Results  Component Value Date   WBC 4.3 06/22/2018   NEUTROABS 1.9 12/31/2011   HGB 13.7 06/22/2018   HCT 43.1 06/22/2018   MCV 91.1 06/22/2018  PLT 190 06/22/2018     STUDIES: US BREAST LTD UNI RIGHT INC AXILLA  Result Date: 04/09/2021 CLINICAL DATA:  Callback for RIGHT breast asymmetry. History of RIGHT breast cancer in 2004 EXAM: DIGITAL DIAGNOSTIC UNILATERAL RIGHT MAMMOGRAM WITH TOMOSYNTHESIS AND CAD; ULTRASOUND RIGHT BREAST LIMITED TECHNIQUE: Right digital diagnostic mammography and breast tomosynthesis was performed. The images were evaluated with computer-aided detection.; Targeted ultrasound examination of the right breast was performed COMPARISON:  Previous exam(s). ACR Breast Density Category b: There are scattered areas of fibroglandular density. FINDINGS: Spot compression tomosynthesis views confirm persistence of a focal asymmetry with associated architectural distortion in the RIGHT upper breast at middle depth. It is best seen on spot CC slice 27 with a favored correlate on ML volume 2, image 37. It is much less conspicuous on ML view. Postsurgical changes are noted in the upper outer posterior breast consistent with history of prior lumpectomy. On physical exam, postsurgical changes are noted. Targeted ultrasound was performed of the RIGHT upper breast. At 12:30 4 cm from the nipple, there is an irregular hypoechoic mass with irregular margins which is taller than  wide. It measures 6 x 6 x 7 mm. This is favored to correspond to the site of screening mammographic concern (mass 1). Approximately 9 mm away from mass 1 at 12 o'clock 4 cm from the nipple, there is an irregular hypoechoic near anechoic mass with a rind of echogenic tissue. This measures 3 x 4 x 3 mm. This may reflect fat necrosis but is indeterminate. In addition, there is a benign simple cyst noted at 12 o'clock 4 cm from the nipple which measures 3 by 2 by 4 mm. Targeted ultrasound was performed of the RIGHT axilla. No suspicious axillary lymph nodes are seen IMPRESSION: 1. At 12:30 4 cm from the nipple, there is an irregular 7 mm mass which is concerning for malignancy. Recommend ultrasound-guided biopsy for definitive characterization. Recommend attention on post marker placement mammogram to assess for mammographic/sonographic correlation. 2. At 12 o'clock 4 cm from the nipple, there is a superficial 4 mm mass which is indeterminate. This may reflect benign fat necrosis but given proximity to suspicious mass, recommend ultrasound-guided biopsy for definitive characterization. 3. No suspicious RIGHT axillary adenopathy. RECOMMENDATION: RIGHT breast ultrasound-guided biopsy x2 I have discussed the findings and recommendations with the patient. The biopsy procedure was discussed with the patient and questions were answered. Patient expressed their understanding of the biopsy recommendation. Patient will be scheduled for biopsy at her earliest convenience by the schedulers. Ordering provider will be notified. If applicable, a reminder letter will be sent to the patient regarding the next appointment. BI-RADS CATEGORY  4: Suspicious. Electronically Signed   By: Valentino Saxon M.D.   On: 04/09/2021 11:55  MM DIAG BREAST TOMO UNI RIGHT  Result Date: 04/09/2021 CLINICAL DATA:  Callback for RIGHT breast asymmetry. History of RIGHT breast cancer in 2004 EXAM: DIGITAL DIAGNOSTIC UNILATERAL RIGHT MAMMOGRAM WITH  TOMOSYNTHESIS AND CAD; ULTRASOUND RIGHT BREAST LIMITED TECHNIQUE: Right digital diagnostic mammography and breast tomosynthesis was performed. The images were evaluated with computer-aided detection.; Targeted ultrasound examination of the right breast was performed COMPARISON:  Previous exam(s). ACR Breast Density Category b: There are scattered areas of fibroglandular density. FINDINGS: Spot compression tomosynthesis views confirm persistence of a focal asymmetry with associated architectural distortion in the RIGHT upper breast at middle depth. It is best seen on spot CC slice 27 with a favored correlate on ML volume 2, image 37. It is much less  conspicuous on ML view. Postsurgical changes are noted in the upper outer posterior breast consistent with history of prior lumpectomy. On physical exam, postsurgical changes are noted. Targeted ultrasound was performed of the RIGHT upper breast. At 12:30 4 cm from the nipple, there is an irregular hypoechoic mass with irregular margins which is taller than wide. It measures 6 x 6 x 7 mm. This is favored to correspond to the site of screening mammographic concern (mass 1). Approximately 9 mm away from mass 1 at 12 o'clock 4 cm from the nipple, there is an irregular hypoechoic near anechoic mass with a rind of echogenic tissue. This measures 3 x 4 x 3 mm. This may reflect fat necrosis but is indeterminate. In addition, there is a benign simple cyst noted at 12 o'clock 4 cm from the nipple which measures 3 by 2 by 4 mm. Targeted ultrasound was performed of the RIGHT axilla. No suspicious axillary lymph nodes are seen IMPRESSION: 1. At 12:30 4 cm from the nipple, there is an irregular 7 mm mass which is concerning for malignancy. Recommend ultrasound-guided biopsy for definitive characterization. Recommend attention on post marker placement mammogram to assess for mammographic/sonographic correlation. 2. At 12 o'clock 4 cm from the nipple, there is a superficial 4 mm mass  which is indeterminate. This may reflect benign fat necrosis but given proximity to suspicious mass, recommend ultrasound-guided biopsy for definitive characterization. 3. No suspicious RIGHT axillary adenopathy. RECOMMENDATION: RIGHT breast ultrasound-guided biopsy x2 I have discussed the findings and recommendations with the patient. The biopsy procedure was discussed with the patient and questions were answered. Patient expressed their understanding of the biopsy recommendation. Patient will be scheduled for biopsy at her earliest convenience by the schedulers. Ordering provider will be notified. If applicable, a reminder letter will be sent to the patient regarding the next appointment. BI-RADS CATEGORY  4: Suspicious. Electronically Signed   By: Valentino Saxon M.D.   On: 04/09/2021 11:55  MM CLIP PLACEMENT RIGHT  Result Date: 04/29/2021 CLINICAL DATA:  Confirmation of clip placement after ultrasound-guided core needle biopsy of 2 adjacent masses in the upper RIGHT breast. EXAM: 2D and 3D DIAGNOSTIC RIGHT MAMMOGRAM POST ULTRASOUND BIOPSY COMPARISON:  Previous exam(s). FINDINGS: 2D and 3D full field CC and mediolateral images were obtained following ultrasound guided biopsy of 2 adjacent masses in the upper RIGHT breast. The coil shaped tissue marking clip is appropriately positioned within the biopsied mass associated with architectural distortion in the upper inner breast at the 12:30 o'clock location. The heart shaped tissue marking clip is appropriately position at the site of the biopsied mass in the upper breast at the 12 o'clock location. The clips are separated by approximately 1.5 cm. Expected post biopsy changes are present without evidence of hematoma at either site. IMPRESSION: 1. Appropriate positioning of the coil shaped tissue marking clip within the biopsied mass in the [place] at the 12:30 o'clock position. 2. Appropriate positioning of the heart shaped tissue marking clip at  the site of the biopsied mass in the upper breast at the 12 o'clock location. 3. The clips are separated by approximately 1.5 cm. Final Assessment: Post Procedure Mammograms for Marker Placement Electronically Signed   By: Evangeline Dakin M.D.   On: 04/29/2021 09:14  [place] RT BREAST BX W LOC DEV 1ST LESION IMG BX SPEC US GUIDE  Addendum Date: 04/30/2021   ADDENDUM REPORT: 04/30/2021 15:17 ADDENDUM: Pathology revealed GRADE II INVASIVE MAMMARY CARCINOMA, NO SPECIAL TYPE, GRADE II DUCTAL CARCINOMA  IN SITU of the RIGHT breast, 12:30 o'clock, 4cmfn (coil clip). This was found to be concordant by Dr. Peggye Fothergill. Pathology revealed FAT NECROSIS of the RIGHT breast, 12 o'clock, 4cmfn (heart clip). This was found to be concordant by Dr. Peggye Fothergill. The fat necrosis is only approximately 1.5 cm away from the biopsy-proven malignancy, so it will likely be removed at the time of excision. Pathology results were discussed with the patient by telephone. The patient reported doing well after the biopsies with tenderness at the sites. Post biopsy instructions and care were reviewed and questions were answered. The patient was encouraged to call [facility] for any additional concerns. Reports and recommendations sent to Al Pimple, RN, and Tanya Nones, RN, Oncology Nurse Navigators at [facility] on 04/30/21 for surgical / oncology referrals. Pathology results reported by Stacie Acres RN on 04/30/2021. Electronically Signed   By: Evangeline Dakin M.D.   On: 04/30/2021 15:17   Result Date: 04/30/2021 CLINICAL DATA:  70 year old with a personal history of RIGHT breast cancer in 2004, presenting with a screening detected 7 mm mass with associated architectural distortion in the upper breast at the 12:30 o'clock position 4 cm from the nipple. There is an indeterminate 4 mm mass adjacent to this dominant mass at the 12 o'clock position 4 cm from the nipple. The  masses are separated by approximately 1 cm. Both masses are sampled. EXAM: ULTRASOUND GUIDED RIGHT BREAST CORE NEEDLE BIOPSY x 2 COMPARISON:  Previous exam(s). PROCEDURE: I met with the patient and we discussed the procedure of ultrasound-guided biopsy, including benefits and alternatives. We discussed the high likelihood of a successful procedure. We discussed the risks of the procedure, including infection, bleeding, tissue injury, clip migration, and inadequate sampling. Informed written consent was given. The usual time-out protocol was performed immediately prior to the procedure. # 1) Dominant 7 mm mass, 12:30 o'clock, lesion quadrant: UPPER INNER QUADRANT. Using sterile technique with chlorhexidine as skin antisepsis, 1% lidocaine and 1% lidocaine with epinephrine as local anesthetic, under direct ultrasound visualization, a 12 gauge Bard Marquee core needle device placed through an 11 gauge introducer needle was used to perform biopsy of the dominant 7 mm mass at the 12:30 o'clock location using a lateral approach. At the conclusion of the procedure, a coil shaped tissue marker clip was deployed into the biopsy cavity. # 2) 4 mm mass, 12 o'clock, lesion quadrant: UPPER BREAST. Using sterile technique with chlorhexidine as skin antisepsis, 1% lidocaine and 1% lidocaine with epinephrine as local anesthetic, under direct ultrasound visualization, a 12 gauge Bard Marquee core needle device placed through an 11 gauge introducer needle was used perform biopsy of the 4 mm mass at 12 o'clock location using a lateral approach from the same dermatotomy site. At the conclusion of the procedure, a heart shaped tissue marker clip was deployed into the biopsy cavity. Follow up 2 view mammogram was performed and dictated separately. IMPRESSION: Ultrasound guided biopsy of 2 adjacent masses in the upper RIGHT breast. No apparent complications. Electronically Signed: By: Evangeline Dakin M.D. On: 04/29/2021 09:16   [place] RT  BREAST BX W LOC DEV EA ADD LESION IMG BX SPEC US GUIDE  Addendum Date: 04/30/2021   ADDENDUM REPORT: 04/30/2021 15:17 ADDENDUM: Pathology revealed GRADE II INVASIVE MAMMARY CARCINOMA, NO SPECIAL TYPE, GRADE II DUCTAL CARCINOMA IN SITU of the RIGHT breast, 12:30 o'clock, 4cmfn (coil clip). This was found to be concordant by Dr. Peggye Fothergill. Pathology  revealed FAT NECROSIS of the RIGHT breast, 12 o'clock, 4cmfn (heart clip). This was found to be concordant by Dr. Peggye Fothergill. The fat necrosis is only approximately 1.5 cm away from the biopsy-proven malignancy, so it will likely be removed at the time of excision. Pathology results were discussed with the patient by telephone. The patient reported doing well after the biopsies with tenderness at the sites. Post biopsy instructions and care were reviewed and questions were answered. The patient was encouraged to call [facility] for any additional concerns. Reports and recommendations sent to Al Pimple, RN, and Tanya Nones, RN, Oncology Nurse Navigators at [facility] on 04/30/21 for surgical / oncology referrals. Pathology results reported by Stacie Acres RN on 04/30/2021. Electronically Signed   By: Evangeline Dakin M.D.   On: 04/30/2021 15:17   Result Date: 04/30/2021 CLINICAL DATA:  70 year old with a personal history of RIGHT breast cancer in 2004, presenting with a screening detected 7 mm mass with associated architectural distortion in the upper breast at the 12:30 o'clock position 4 cm from the nipple. There is an indeterminate 4 mm mass adjacent to this dominant mass at the 12 o'clock position 4 cm from the nipple. The masses are separated by approximately 1 cm. Both masses are sampled. EXAM: ULTRASOUND GUIDED RIGHT BREAST CORE NEEDLE BIOPSY x 2 COMPARISON:  Previous exam(s). PROCEDURE: I met with the patient and we discussed the procedure of ultrasound-guided biopsy, including benefits  and alternatives. We discussed the high likelihood of a successful procedure. We discussed the risks of the procedure, including infection, bleeding, tissue injury, clip migration, and inadequate sampling. Informed written consent was given. The usual time-out protocol was performed immediately prior to the procedure. # 1) Dominant 7 mm mass, 12:30 o'clock, lesion quadrant: UPPER INNER QUADRANT. Using sterile technique with chlorhexidine as skin antisepsis, 1% lidocaine and 1% lidocaine with epinephrine as local anesthetic, under direct ultrasound visualization, a 12 gauge Bard Marquee core needle device placed through an 11 gauge introducer needle was used to perform biopsy of the dominant 7 mm mass at the 12:30 o'clock location using a lateral approach. At the conclusion of the procedure, a coil shaped tissue marker clip was deployed into the biopsy cavity. # 2) 4 mm mass, 12 o'clock, lesion quadrant: UPPER BREAST. Using sterile technique with chlorhexidine as skin antisepsis, 1% lidocaine and 1% lidocaine with epinephrine as local anesthetic, under direct ultrasound visualization, a 12 gauge Bard Marquee core needle device placed through an 11 gauge introducer needle was used perform biopsy of the 4 mm mass at 12 o'clock location using a lateral approach from the same dermatotomy site. At the conclusion of the procedure, a heart shaped tissue marker clip was deployed into the biopsy cavity. Follow up 2 view mammogram was performed and dictated separately. IMPRESSION: Ultrasound guided biopsy of 2 adjacent masses in the upper RIGHT breast. No apparent complications. Electronically Signed: By: Evangeline Dakin M.D. 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  Patient has an appointment with surgery on Monday, May 05, 2021 to discuss lumpectomy or mastectomy.  Patient had whole breast radiation greater than 15 years ago so is unclear if adjuvant XRT is possible.  Patient took tamoxifen for 5 years after her DCIS, but  if malignancy is ER/PR positive patient will receive letrozole.  No intervention is needed at this time.  Return to clinic 1 to 2 weeks after her surgery for further evaluation, discussion of her final pathology results, and treatment planning.  Patient will also have consultation with radiation oncology on that day to determine if adjuvant XRT is possible. Genetic testing: Patient does not have any biologic children.  A referral has been sent to genetics.  I spent a total of 60 minutes reviewing chart data, face-to-face evaluation with the patient, counseling and coordination of care as detailed above.   Patient expressed understanding and was in agreement with this plan. She also understands that She can call clinic at any time with any questions, concerns, or complaints.    Cancer Staging  Invasive ductal carcinoma of right breast [facility]) Staging form: Breast, AJCC 8th Edition - Clinical stage from 05/04/2021: cT1b, cN0, cM0, G2 - Signed by Lloyd Huger, MD on 05/04/2021 Stage prefix: Initial diagnosis Histologic grading system: 3 grade system  Lloyd Huger, MD   05/04/2021 6:55 AM

## 2021-05-05 LAB — SURGICAL PATHOLOGY

## 2021-05-08 ENCOUNTER — Other Ambulatory Visit: Payer: Self-pay | Admitting: Surgery

## 2021-05-08 ENCOUNTER — Ambulatory Visit: Payer: Self-pay | Admitting: Surgery

## 2021-05-08 DIAGNOSIS — C50211 Malignant neoplasm of upper-inner quadrant of right female breast: Secondary | ICD-10-CM

## 2021-05-08 DIAGNOSIS — Z170 Estrogen receptor positive status [ER+]: Secondary | ICD-10-CM

## 2021-05-08 NOTE — H&P (Signed)
Subjective:   CC: Malignant neoplasm of upper-inner quadrant of right breast in female, estrogen receptor positive (CMS-HCC) [C50.211, Z17.0] HPI: Regina Blanchard is a 70 y.o. female who was referred by Yevonne Pax, MD for evaluation of above. Change was noted on last screening mammogram. Hx of previous breast CA on same side, s/p whole breast? Radiation. Pt does not recall undergoing sentinal lymph node biopsy.  Currently asymptomatic otherwise.  Past Medical History: has a past medical history of Carcinoma in situ of right breast, Fibrocystic breast disease, and Vitamin D deficiency (01/08/2014).  Past Surgical History: has a past surgical history that includes Cross-eye surgery; Exploratory laparotomy (1991); Mastectomy partial / lumpectomy (2004); Hx of breast biopsy (1990); Right total knee arthroplasty using computer-assisted navigation (07/06/2018); Colonoscopy (04/04/2010, 01/03/2007); Colonoscopy (07/30/2020); and Left total knee arthroplasty using computer-assisted navigation (02/10/2021).  Family History: family history includes Colon cancer in her maternal aunt; Diabetes in her mother; Diabetes type II in her brother, mother, and sister; Heart failure in her father; Myocardial Infarction (Heart attack) in her father; Parkinsonism in her mother.  Social History: reports that she has never smoked. She has never used smokeless tobacco. She reports that she does not drink alcohol and does not use drugs.  Current Medications: has a current medication list which includes the following prescription(s): alprazolam, amoxicillin, aspirin, baclofen, celecoxib, cholecalciferol, diclofenac, herbal supplement, hydrochlorothiazide, ibuprofen, multivitamin, tizanidine, ondansetron, oxycodone, and tramadol.  Allergies:  Allergies as of 05/05/2021   (No Known Allergies)   ROS:  A 15 point review of systems was performed and was negative except as noted in HPI  Objective:    BP (!)  147/95   Pulse 89   Ht 172.7 cm (5\' 8" )   Wt 87.1 kg (192 lb)   BMI 29.19 kg/m   Constitutional : No distress, cooperative, alert  Lymphatics/Throat: Supple with no lymphadenopathy  Respiratory: Clear to auscultation bilaterally  Cardiovascular: Regular rate and rhythm  Gastrointestinal: Soft, non-tender, non-distended, no organomegaly.  Musculoskeletal: Steady gait and movement  Skin: Cool and moist, right breast surgical scar.  Psychiatric: Normal affect, non-agitated, not confused  Breast: Chaperone present. Pt with post biopsy bruising on right breast. Only one visible scar at former lumpectomy site. No scars near axilla. No lymphadenopathy noted.    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 This was found to be concordant by Dr. Peggye Fothergill.   Pathology revealed FAT NECROSIS of the RIGHT breast, 12 o'clock, 4cmfn (heart clip). This was found to be concordant by Dr. Peggye Fothergill. The fat necrosis is only approximately 1.5 cm away from the biopsy-proven malignancy, so it will likely be removed at the time of excision.   Pathology results were discussed with the patient by telephone. The patient reported doing well after the biopsies with tenderness at the sites. Post biopsy instructions and care were reviewed and questions were answered. The patient was encouraged to call [facility] for any additional concerns.   Reports and recommendations sent to Al Pimple, RN, and Tanya Nones, RN, Oncology Nurse Navigators at [facility] on 04/30/21 for surgical / oncology referrals.  CLINICAL DATA: Callback for RIGHT breast asymmetry. History of RIGHT breast cancer in 2004   EXAM: DIGITAL DIAGNOSTIC UNILATERAL RIGHT MAMMOGRAM  WITH TOMOSYNTHESIS AND CAD;  ULTRASOUND RIGHT BREAST LIMITED   TECHNIQUE: Right digital diagnostic mammography and breast tomosynthesis was performed. The images were evaluated with computer-aided detection.; Targeted ultrasound examination of the right breast was performed   COMPARISON: Previous exam(s).   ACR Breast Density Category b: There are scattered areas of fibroglandular density.   FINDINGS: Spot compression tomosynthesis views confirm persistence of a focal asymmetry with associated architectural distortion in the RIGHT upper breast at middle depth. It is best seen on spot CC slice 27 with a favored correlate on ML volume 2, image 37. It is much less conspicuous on ML view. Postsurgical changes are noted in the upper outer posterior breast consistent with history of prior lumpectomy.   On physical exam, postsurgical changes are noted.   Targeted ultrasound was performed of the RIGHT upper breast. At 12:30 4 cm from the nipple, there is an irregular hypoechoic mass with irregular margins which is taller than wide. It measures 6 x 6 x 7 mm. This is favored to correspond to the site of screening mammographic concern (mass 1).   Approximately 9 mm away from mass 1 at 12 o'clock 4 cm from the nipple, there is an irregular hypoechoic near anechoic mass with a rind of echogenic tissue. This measures 3 x 4 x 3 mm. This may reflect fat necrosis but is indeterminate.   In addition, there is a benign simple cyst noted at 12 o'clock 4 cm from the nipple which measures 3 by 2 by 4 mm.   Targeted ultrasound was performed of the RIGHT axilla. No suspicious axillary lymph nodes are seen   IMPRESSION: 1. At 12:30 4 cm from the nipple, there is an irregular 7 mm mass which is concerning for malignancy. Recommend ultrasound-guided biopsy for definitive characterization. Recommend attention on post marker placement mammogram to assess for mammographic/sonographic correlation. 2.  At 12 o'clock 4 cm from the nipple, there is a superficial 4 mm mass which is indeterminate. This may reflect benign fat necrosis but given proximity to suspicious mass, recommend ultrasound-guided biopsy for definitive characterization. 3. No suspicious RIGHT axillary adenopathy.   RECOMMENDATION: RIGHT breast ultrasound-guided biopsy x2   I have discussed the findings and recommendations with the patient. The biopsy procedure was discussed with the patient and questions were answered. Patient expressed their understanding of the biopsy recommendation. Patient will be scheduled for biopsy at her earliest convenience by the schedulers. Ordering provider will be notified. If applicable, a reminder letter will be sent to the patient regarding the next appointment.   BI-RADS CATEGORY 4: Suspicious.     Electronically Signed By: Valentino Saxon M.D. On: 04/09/2021 11:55    Assessment:   Malignant neoplasm of upper-inner quadrant of right breast in female, estrogen receptor positive (CMS-HCC) [C50.211, Z17.0]  Plan:    1. Malignant neoplasm of upper-inner quadrant of right breast in female, estrogen receptor positive (CMS-HCC) [C50.211, Z17.0] will discuss with rad-onc if repeat XRT an option. If feasible repeat lumpectomy likely better option than mastectomy to minimize postoperative complications. If XRT not possible mastectomy likely with better oncologic outcome. Will pursue SLNB in either case.

## 2021-05-08 NOTE — H&P (View-Only) (Signed)
Subjective:   CC: Malignant neoplasm of upper-inner quadrant of right breast in female, estrogen receptor positive (CMS-HCC) [C50.211, Z17.0] HPI: Regina Blanchard is a 70 y.o. female who was referred by Yevonne Pax, MD for evaluation of above. Change was noted on last screening mammogram. Hx of previous breast CA on same side, s/p whole breast? Radiation. Pt does not recall undergoing sentinal lymph node biopsy.  Currently asymptomatic otherwise.  Past Medical History: has a past medical history of Carcinoma in situ of right breast, Fibrocystic breast disease, and Vitamin D deficiency (01/08/2014).  Past Surgical History: has a past surgical history that includes Cross-eye surgery; Exploratory laparotomy (1991); Mastectomy partial / lumpectomy (2004); Hx of breast biopsy (1990); Right total knee arthroplasty using computer-assisted navigation (07/06/2018); Colonoscopy (04/04/2010, 01/03/2007); Colonoscopy (07/30/2020); and Left total knee arthroplasty using computer-assisted navigation (02/10/2021).  Family History: family history includes Colon cancer in her maternal aunt; Diabetes in her mother; Diabetes type II in her brother, mother, and sister; Heart failure in her father; Myocardial Infarction (Heart attack) in her father; Parkinsonism in her mother.  Social History: reports that she has never smoked. She has never used smokeless tobacco. She reports that she does not drink alcohol and does not use drugs.  Current Medications: has a current medication list which includes the following prescription(s): alprazolam, amoxicillin, aspirin, baclofen, celecoxib, cholecalciferol, diclofenac, herbal supplement, hydrochlorothiazide, ibuprofen, multivitamin, tizanidine, ondansetron, oxycodone, and tramadol.  Allergies:  Allergies as of 05/05/2021   (No Known Allergies)   ROS:  A 15 point review of systems was performed and was negative except as noted in HPI  Objective:    BP (!)  147/95   Pulse 89   Ht 172.7 cm (5\' 8" )   Wt 87.1 kg (192 lb)   BMI 29.19 kg/m   Constitutional : No distress, cooperative, alert  Lymphatics/Throat: Supple with no lymphadenopathy  Respiratory: Clear to auscultation bilaterally  Cardiovascular: Regular rate and rhythm  Gastrointestinal: Soft, non-tender, non-distended, no organomegaly.  Musculoskeletal: Steady gait and movement  Skin: Cool and moist, right breast surgical scar.  Psychiatric: Normal affect, non-agitated, not confused  Breast: Chaperone present. Pt with post biopsy bruising on right breast. Only one visible scar at former lumpectomy site. No scars near axilla. No lymphadenopathy noted.    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 This was found to be concordant by Dr. Peggye Fothergill.   Pathology revealed FAT NECROSIS of the RIGHT breast, 12 o'clock, 4cmfn (heart clip). This was found to be concordant by Dr. Peggye Fothergill. The fat necrosis is only approximately 1.5 cm away from the biopsy-proven malignancy, so it will likely be removed at the time of excision.   Pathology results were discussed with the patient by telephone. The patient reported doing well after the biopsies with tenderness at the sites. Post biopsy instructions and care were reviewed and questions were answered. The patient was encouraged to call [facility] for any additional concerns.   Reports and recommendations sent to Al Pimple, RN, and Tanya Nones, RN, Oncology Nurse Navigators at [facility] on 04/30/21 for surgical / oncology referrals.  CLINICAL DATA: Callback for RIGHT breast asymmetry. History of RIGHT breast cancer in 2004   EXAM: DIGITAL DIAGNOSTIC UNILATERAL RIGHT MAMMOGRAM  WITH TOMOSYNTHESIS AND CAD;  ULTRASOUND RIGHT BREAST LIMITED   TECHNIQUE: Right digital diagnostic mammography and breast tomosynthesis was performed. The images were evaluated with computer-aided detection.; Targeted ultrasound examination of the right breast was performed   COMPARISON: Previous exam(s).   ACR Breast Density Category b: There are scattered areas of fibroglandular density.   FINDINGS: Spot compression tomosynthesis views confirm persistence of a focal asymmetry with associated architectural distortion in the RIGHT upper breast at middle depth. It is best seen on spot CC slice 27 with a favored correlate on ML volume 2, image 37. It is much less conspicuous on ML view. Postsurgical changes are noted in the upper outer posterior breast consistent with history of prior lumpectomy.   On physical exam, postsurgical changes are noted.   Targeted ultrasound was performed of the RIGHT upper breast. At 12:30 4 cm from the nipple, there is an irregular hypoechoic mass with irregular margins which is taller than wide. It measures 6 x 6 x 7 mm. This is favored to correspond to the site of screening mammographic concern (mass 1).   Approximately 9 mm away from mass 1 at 12 o'clock 4 cm from the nipple, there is an irregular hypoechoic near anechoic mass with a rind of echogenic tissue. This measures 3 x 4 x 3 mm. This may reflect fat necrosis but is indeterminate.   In addition, there is a benign simple cyst noted at 12 o'clock 4 cm from the nipple which measures 3 by 2 by 4 mm.   Targeted ultrasound was performed of the RIGHT axilla. No suspicious axillary lymph nodes are seen   IMPRESSION: 1. At 12:30 4 cm from the nipple, there is an irregular 7 mm mass which is concerning for malignancy. Recommend ultrasound-guided biopsy for definitive characterization. Recommend attention on post marker placement mammogram to assess for mammographic/sonographic correlation. 2.  At 12 o'clock 4 cm from the nipple, there is a superficial 4 mm mass which is indeterminate. This may reflect benign fat necrosis but given proximity to suspicious mass, recommend ultrasound-guided biopsy for definitive characterization. 3. No suspicious RIGHT axillary adenopathy.   RECOMMENDATION: RIGHT breast ultrasound-guided biopsy x2   I have discussed the findings and recommendations with the patient. The biopsy procedure was discussed with the patient and questions were answered. Patient expressed their understanding of the biopsy recommendation. Patient will be scheduled for biopsy at her earliest convenience by the schedulers. Ordering provider will be notified. If applicable, a reminder letter will be sent to the patient regarding the next appointment.   BI-RADS CATEGORY 4: Suspicious.     Electronically Signed By: Valentino Saxon M.D. On: 04/09/2021 11:55    Assessment:   Malignant neoplasm of upper-inner quadrant of right breast in female, estrogen receptor positive (CMS-HCC) [C50.211, Z17.0]  Plan:    1. Malignant neoplasm of upper-inner quadrant of right breast in female, estrogen receptor positive (CMS-HCC) [C50.211, Z17.0] will discuss with rad-onc if repeat XRT an option. If feasible repeat lumpectomy likely better option than mastectomy to minimize postoperative complications. If XRT not possible mastectomy likely with better oncologic outcome. Will pursue SLNB in either case.

## 2021-05-14 ENCOUNTER — Ambulatory Visit
Admission: RE | Admit: 2021-05-14 | Discharge: 2021-05-14 | Disposition: A | Discharge status: Home / Self Care | Payer: Medicare PPO | Source: Ambulatory Visit | Attending: Surgery | Admitting: Surgery

## 2021-05-14 ENCOUNTER — Encounter
Admission: RE | Admit: 2021-05-14 | Discharge: 2021-05-14 | Disposition: A | Discharge status: Home / Self Care | Payer: Medicare PPO | Source: Ambulatory Visit | Attending: Surgery | Admitting: Surgery

## 2021-05-14 ENCOUNTER — Other Ambulatory Visit: Payer: Self-pay

## 2021-05-14 DIAGNOSIS — C50211 Malignant neoplasm of upper-inner quadrant of right female breast: Secondary | ICD-10-CM | POA: Diagnosis not present

## 2021-05-14 DIAGNOSIS — Z79899 Other long term (current) drug therapy: Secondary | ICD-10-CM

## 2021-05-14 DIAGNOSIS — Z170 Estrogen receptor positive status [ER+]: Secondary | ICD-10-CM | POA: Diagnosis present

## 2021-05-14 NOTE — Patient Instructions (Signed)
Your procedure is scheduled on:05-22-21 Thursday Report to the Registration Desk on the 1st floor of the Medical Mall-Then proceed to the Radiology Desk (2nd desk on right) in the Medical Mall-Arrive at 9:30 AM  REMEMBER: Instructions that are not followed completely may result in serious medical risk, up to and including death; or upon the discretion of your surgeon and anesthesiologist your surgery may need to be rescheduled.  Do not eat food after midnight the night before surgery.  No gum chewing, lozengers or hard candies.  You may however, drink CLEAR liquids up to 2 hours before you are scheduled to arrive for your surgery. Do not drink anything within 2 hours of your scheduled arrival time.  Clear liquids include: - water  - apple juice without pulp - gatorade (not RED, PURPLE, OR BLUE) - black coffee or tea (Do NOT add milk or creamers to the coffee or tea) Do NOT drink anything that is not on this list.  TAKE THESE MEDICATIONS THE MORNING OF SURGERY WITH A SIP OF WATER: -You may take ALPRAZolam Duanne Moron) the day of surgery if needed  Aspirin was stopped week of 05-07-21-Instructed not to start back until after surgery  One week prior to surgery: Stop Anti-inflammatories (NSAIDS) such as Advil, Aleve, Ibuprofen, Motrin, Naproxen, Naprosyn and Aspirin based products such as Excedrin, Goodys Powder, BC Powder.You may however, continue to take Tylenol if needed for pain up until the day of surgery.  Stop ANY OVER THE COUNTER supplements/vitamins NOW (05-14-21) until after surgery (Cholecalciferol (VITAMIN D) and Multiple Vitamin (MULTIVITAMIN) capsule (Balance of nature)  No Alcohol for 24 hours before or after surgery.  No Smoking including e-cigarettes for 24 hours prior to surgery.  No chewable tobacco products for at least 6 hours prior to surgery.  No nicotine patches on the day of surgery.  Do not use any "recreational" drugs for at least a week prior to your surgery.   Please be advised that the combination of cocaine and anesthesia may have negative outcomes, up to and including death. If you test positive for cocaine, your surgery will be cancelled.  On the morning of surgery brush your teeth with toothpaste and water, you may rinse your mouth with mouthwash if you wish. Do not swallow any toothpaste or mouthwash.  Use CHG Soap or wipes as directed on instruction sheet.  Do not wear jewelry, make-up, hairpins, clips or nail polish.  Do not wear lotions, powders, or perfumes.   Do not shave body from the neck down 48 hours prior to surgery just in case you cut yourself which could leave a site for infection.  Also, freshly shaved skin may become irritated if using the CHG soap.  Contact lenses, hearing aids and dentures may not be worn into surgery.  Do not bring valuables to the hospital. [facility] [place] is not responsible for any missing/lost belongings or valuables.   Notify your doctor if there is any change in your medical condition (cold, fever, infection).  Wear comfortable clothing (specific to your surgery type) to the hospital.  After surgery, you can help prevent lung complications by doing breathing exercises.  Take deep breaths and cough every 1-2 hours. Your doctor may order a device called an Incentive Spirometer to help you take deep breaths. When coughing or sneezing, hold a pillow firmly against your incision with both hands. This is called splinting. Doing this helps protect your incision. It also decreases belly discomfort.  If you are being admitted to the hospital  overnight, leave your suitcase in the car. After surgery it may be brought to your room.  If you are being discharged the day of surgery, you will not be allowed to drive home. You will need a responsible adult (18 years or older) to drive you home and stay with you that night.   If you are taking public transportation, you will need to have a responsible adult (18  years or older) with you. Please confirm with your physician that it is acceptable to use public transportation.   Please call the [place] [facility] at [phone] if you have any questions about these instructions.  Surgery Visitation Policy:  Patients undergoing a surgery or procedure may have one family member or support person with them as long as that person is not COVID-19 positive or experiencing its symptoms.  That person may remain in the waiting area during the procedure and may rotate out with other people.  Inpatient Visitation:    Visiting hours are 7 a.m. to 8 p.m. Up to two visitors ages 16+ are allowed at one time in a patient room. The visitors may rotate out with other people during the day. Visitors must check out when they leave, or other visitors will not be allowed. One designated support person may remain overnight. The visitor must pass COVID-19 screenings, use hand sanitizer when entering and exiting the patients room and wear a mask at all times, including in the patients room. Patients must also wear a mask when staff or their visitor are in the room. Masking is required regardless of vaccination status.

## 2021-05-15 ENCOUNTER — Encounter
Admission: RE | Admit: 2021-05-15 | Discharge: 2021-05-15 | Disposition: A | Discharge status: Home / Self Care | Payer: Medicare PPO | Source: Ambulatory Visit | Attending: Surgery | Admitting: Surgery

## 2021-05-15 DIAGNOSIS — Z01812 Encounter for preprocedural laboratory examination: Secondary | ICD-10-CM | POA: Insufficient documentation

## 2021-05-15 DIAGNOSIS — Z79899 Other long term (current) drug therapy: Secondary | ICD-10-CM | POA: Insufficient documentation

## 2021-05-15 LAB — POTASSIUM: Potassium: 4.1 mmol/L (ref 3.5–5.1)

## 2021-05-22 ENCOUNTER — Encounter
Admission: RE | Disposition: A | Discharge status: Home / Self Care | Payer: Self-pay | Source: Home / Self Care | Attending: Surgery

## 2021-05-22 ENCOUNTER — Ambulatory Visit
Admission: RE | Admit: 2021-05-22 | Discharge: 2021-05-22 | Disposition: A | Discharge status: Home / Self Care | Payer: Medicare PPO | Attending: Surgery | Admitting: Surgery

## 2021-05-22 ENCOUNTER — Ambulatory Visit
Admission: RE | Admit: 2021-05-22 | Discharge: 2021-05-22 | Disposition: A | Discharge status: Home / Self Care | Payer: Medicare PPO | Source: Ambulatory Visit | Attending: Surgery | Admitting: Surgery

## 2021-05-22 ENCOUNTER — Ambulatory Visit: Payer: Medicare PPO | Admitting: Certified Registered Nurse Anesthetist

## 2021-05-22 ENCOUNTER — Encounter: Payer: Self-pay | Admitting: Surgery

## 2021-05-22 ENCOUNTER — Ambulatory Visit: Payer: Medicare PPO | Admitting: Urgent Care

## 2021-05-22 ENCOUNTER — Other Ambulatory Visit: Payer: Self-pay

## 2021-05-22 DIAGNOSIS — C50211 Malignant neoplasm of upper-inner quadrant of right female breast: Secondary | ICD-10-CM | POA: Diagnosis not present

## 2021-05-22 DIAGNOSIS — Z853 Personal history of malignant neoplasm of breast: Secondary | ICD-10-CM | POA: Insufficient documentation

## 2021-05-22 DIAGNOSIS — N641 Fat necrosis of breast: Secondary | ICD-10-CM | POA: Insufficient documentation

## 2021-05-22 DIAGNOSIS — C50911 Malignant neoplasm of unspecified site of right female breast: Secondary | ICD-10-CM

## 2021-05-22 DIAGNOSIS — Z170 Estrogen receptor positive status [ER+]: Secondary | ICD-10-CM | POA: Diagnosis not present

## 2021-05-22 DIAGNOSIS — N6021 Fibroadenosis of right breast: Secondary | ICD-10-CM | POA: Insufficient documentation

## 2021-05-22 HISTORY — PX: MASTECTOMY: SHX3

## 2021-05-22 HISTORY — PX: BREAST LUMPECTOMY,RADIO FREQ LOCALIZER,AXILLARY SENTINEL LYMPH NODE BIOPSY: SHX6900

## 2021-05-22 SURGERY — BREAST LUMPECTOMY,RADIO FREQ LOCALIZER,AXILLARY SENTINEL LYMPH NODE BIOPSY
Anesthesia: General | Laterality: Right

## 2021-05-22 MED ORDER — LIDOCAINE HCL (CARDIAC) PF 100 MG/5ML IV SOSY
PREFILLED_SYRINGE | INTRAVENOUS | Status: DC | PRN
  Administered 2021-05-22: 100 mg via INTRAVENOUS

## 2021-05-22 MED ORDER — ROCURONIUM BROMIDE 10 MG/ML (PF) SYRINGE
PREFILLED_SYRINGE | INTRAVENOUS | Status: AC
  Filled 2021-05-22: qty 10

## 2021-05-22 MED ORDER — PROPOFOL 10 MG/ML IV BOLUS
INTRAVENOUS | Status: DC | PRN
  Administered 2021-05-22: 150 mg via INTRAVENOUS

## 2021-05-22 MED ORDER — LACTATED RINGERS IV SOLN: INTRAVENOUS | Status: DC

## 2021-05-22 MED ORDER — OXYCODONE HCL 5 MG/5ML PO SOLN: 5.0000 mg | Freq: Once | ORAL | Status: AC | PRN

## 2021-05-22 MED ORDER — FAMOTIDINE 20 MG PO TABS
ORAL_TABLET | ORAL | Status: AC
  Administered 2021-05-22: 10:00:00 20 mg via ORAL
  Filled 2021-05-22: qty 1

## 2021-05-22 MED ORDER — FENTANYL CITRATE (PF) 100 MCG/2ML IJ SOLN
INTRAMUSCULAR | Status: DC | PRN
  Administered 2021-05-22 (×4): 25 ug via INTRAVENOUS

## 2021-05-22 MED ORDER — BUPIVACAINE-EPINEPHRINE 0.5% -1:200000 IJ SOLN
INTRAMUSCULAR | Status: DC | PRN
  Administered 2021-05-22: 6 mL

## 2021-05-22 MED ORDER — ACETAMINOPHEN 10 MG/ML IV SOLN: 1000.0000 mg | Freq: Once | INTRAVENOUS | Status: DC | PRN

## 2021-05-22 MED ORDER — CEFAZOLIN SODIUM-DEXTROSE 2-4 GM/100ML-% IV SOLN
2.0000 g | INTRAVENOUS | Status: AC
  Administered 2021-05-22: 13:00:00 2 g via INTRAVENOUS

## 2021-05-22 MED ORDER — DEXAMETHASONE SODIUM PHOSPHATE 10 MG/ML IJ SOLN
INTRAMUSCULAR | Status: DC | PRN
  Administered 2021-05-22: 10 mg via INTRAVENOUS

## 2021-05-22 MED ORDER — APREPITANT 40 MG PO CAPS
ORAL_CAPSULE | ORAL | Status: AC
  Filled 2021-05-22: qty 1

## 2021-05-22 MED ORDER — MIDAZOLAM HCL 2 MG/2ML IJ SOLN
INTRAMUSCULAR | Status: DC | PRN
  Administered 2021-05-22: 2 mg via INTRAVENOUS

## 2021-05-22 MED ORDER — METHYLENE BLUE 0.5 % INJ SOLN
INTRAVENOUS | Status: DC | PRN
  Administered 2021-05-22: 10 mL

## 2021-05-22 MED ORDER — PROPOFOL 500 MG/50ML IV EMUL
INTRAVENOUS | Status: AC
  Filled 2021-05-22: qty 50

## 2021-05-22 MED ORDER — PROPOFOL 10 MG/ML IV BOLUS
INTRAVENOUS | Status: AC
  Filled 2021-05-22: qty 20

## 2021-05-22 MED ORDER — EPHEDRINE 5 MG/ML INJ
INTRAVENOUS | Status: AC
  Filled 2021-05-22: qty 15

## 2021-05-22 MED ORDER — ACETAMINOPHEN 10 MG/ML IV SOLN
INTRAVENOUS | Status: AC
  Filled 2021-05-22: qty 100

## 2021-05-22 MED ORDER — KETOROLAC TROMETHAMINE 30 MG/ML IJ SOLN
INTRAMUSCULAR | Status: AC
  Filled 2021-05-22: qty 1

## 2021-05-22 MED ORDER — KETOROLAC TROMETHAMINE 30 MG/ML IJ SOLN
INTRAMUSCULAR | Status: DC | PRN
  Administered 2021-05-22: 30 mg via INTRAVENOUS

## 2021-05-22 MED ORDER — CHLORHEXIDINE GLUCONATE CLOTH 2 % EX PADS: 6.0000 | MEDICATED_PAD | Freq: Once | CUTANEOUS | Status: DC

## 2021-05-22 MED ORDER — ONDANSETRON HCL 4 MG/2ML IJ SOLN
INTRAMUSCULAR | Status: DC | PRN
  Administered 2021-05-22: 4 mg via INTRAVENOUS

## 2021-05-22 MED ORDER — ONDANSETRON HCL 4 MG/2ML IJ SOLN: 4.0000 mg | Freq: Once | INTRAMUSCULAR | Status: DC | PRN

## 2021-05-22 MED ORDER — FENTANYL CITRATE (PF) 100 MCG/2ML IJ SOLN
25.0000 ug | INTRAMUSCULAR | Status: DC | PRN
  Administered 2021-05-22: 14:00:00 25 ug via INTRAVENOUS

## 2021-05-22 MED ORDER — PHENYLEPHRINE 40 MCG/ML (10ML) SYRINGE FOR IV PUSH (FOR BLOOD PRESSURE SUPPORT)
PREFILLED_SYRINGE | INTRAVENOUS | Status: DC | PRN
  Administered 2021-05-22 (×8): 80 ug via INTRAVENOUS

## 2021-05-22 MED ORDER — DEXAMETHASONE SODIUM PHOSPHATE 10 MG/ML IJ SOLN
INTRAMUSCULAR | Status: AC
  Filled 2021-05-22: qty 2

## 2021-05-22 MED ORDER — BUPIVACAINE-EPINEPHRINE (PF) 0.5% -1:200000 IJ SOLN
INTRAMUSCULAR | Status: AC
  Filled 2021-05-22: qty 30

## 2021-05-22 MED ORDER — ACETAMINOPHEN 10 MG/ML IV SOLN
INTRAVENOUS | Status: DC | PRN
  Administered 2021-05-22: 1000 mg via INTRAVENOUS

## 2021-05-22 MED ORDER — OXYCODONE HCL 5 MG PO TABS
ORAL_TABLET | ORAL | Status: AC
  Filled 2021-05-22: qty 1

## 2021-05-22 MED ORDER — DOCUSATE SODIUM 100 MG PO CAPS: 100.0000 mg | ORAL_CAPSULE | Freq: Two times a day (BID) | ORAL | 0 refills | Status: AC | PRN

## 2021-05-22 MED ORDER — METHYLENE BLUE 0.5 % INJ SOLN
INTRAVENOUS | Status: AC
  Filled 2021-05-22: qty 10

## 2021-05-22 MED ORDER — LIDOCAINE HCL (PF) 2 % IJ SOLN
INTRAMUSCULAR | Status: AC
  Filled 2021-05-22: qty 10

## 2021-05-22 MED ORDER — APREPITANT 40 MG PO CAPS
40.0000 mg | ORAL_CAPSULE | Freq: Once | ORAL | Status: AC
  Administered 2021-05-22: 10:00:00 40 mg via ORAL

## 2021-05-22 MED ORDER — TECHNETIUM TC 99M TILMANOCEPT KIT
1.1740 | PACK | Freq: Once | INTRAVENOUS | Status: AC | PRN
  Administered 2021-05-22: 1.174 via INTRADERMAL

## 2021-05-22 MED ORDER — ONDANSETRON HCL 4 MG/2ML IJ SOLN
INTRAMUSCULAR | Status: AC
  Filled 2021-05-22: qty 4

## 2021-05-22 MED ORDER — LIDOCAINE HCL (PF) 1 % IJ SOLN
INTRAMUSCULAR | Status: AC
  Filled 2021-05-22: qty 30

## 2021-05-22 MED ORDER — PROPOFOL 500 MG/50ML IV EMUL
INTRAVENOUS | Status: DC | PRN
  Administered 2021-05-22: 150 ug/kg/min via INTRAVENOUS

## 2021-05-22 MED ORDER — FENTANYL CITRATE (PF) 100 MCG/2ML IJ SOLN
INTRAMUSCULAR | Status: AC
  Filled 2021-05-22: qty 2

## 2021-05-22 MED ORDER — HYDROCODONE-ACETAMINOPHEN 5-325 MG PO TABS
1.0000 | ORAL_TABLET | Freq: Four times a day (QID) | ORAL | 0 refills | Status: DC | PRN
Start: 2021-05-22 — End: 2021-07-21

## 2021-05-22 MED ORDER — CHLORHEXIDINE GLUCONATE 0.12 % MT SOLN: 15.0000 mL | Freq: Once | OROMUCOSAL | Status: AC

## 2021-05-22 MED ORDER — CEFAZOLIN SODIUM-DEXTROSE 2-4 GM/100ML-% IV SOLN
INTRAVENOUS | Status: AC
  Filled 2021-05-22: qty 100

## 2021-05-22 MED ORDER — CHLORHEXIDINE GLUCONATE 0.12 % MT SOLN
OROMUCOSAL | Status: AC
  Administered 2021-05-22: 10:00:00 15 mL via OROMUCOSAL
  Filled 2021-05-22: qty 15

## 2021-05-22 MED ORDER — OXYCODONE HCL 5 MG PO TABS
5.0000 mg | ORAL_TABLET | Freq: Once | ORAL | Status: AC | PRN
  Administered 2021-05-22: 15:00:00 5 mg via ORAL

## 2021-05-22 MED ORDER — ACETAMINOPHEN 325 MG PO TABS: 650.0000 mg | ORAL_TABLET | Freq: Three times a day (TID) | ORAL | 0 refills | Status: AC | PRN

## 2021-05-22 MED ORDER — FAMOTIDINE 20 MG PO TABS: 20.0000 mg | ORAL_TABLET | Freq: Once | ORAL | Status: AC

## 2021-05-22 MED ORDER — LIDOCAINE HCL 1 % IJ SOLN
INTRAMUSCULAR | Status: DC | PRN
  Administered 2021-05-22: 6 mL

## 2021-05-22 MED ORDER — ORAL CARE MOUTH RINSE: 15.0000 mL | Freq: Once | OROMUCOSAL | Status: AC

## 2021-05-22 SURGICAL SUPPLY — 50 items
ADH SKN CLS APL DERMABOND .7 (GAUZE/BANDAGES/DRESSINGS) ×1
APL PRP STRL LF DISP 70% ISPRP (MISCELLANEOUS) ×1
APPLIER CLIP 11 MED OPEN (CLIP)
APR CLP MED 11 20 MLT OPN (CLIP)
BLADE SURG 15 STRL LF DISP TIS (BLADE) ×1 IMPLANT
BLADE SURG 15 STRL SS (BLADE) ×3
CHLORAPREP W/TINT 26 (MISCELLANEOUS) ×3 IMPLANT
CLIP APPLIE 11 MED OPEN (CLIP) IMPLANT
CNTNR SPEC 2.5X3XGRAD LEK (MISCELLANEOUS)
CONT SPEC 4OZ STER OR WHT (MISCELLANEOUS)
CONT SPEC 4OZ STRL OR WHT (MISCELLANEOUS)
CONTAINER SPEC 2.5X3XGRAD LEK (MISCELLANEOUS) IMPLANT
DERMABOND ADVANCED (GAUZE/BANDAGES/DRESSINGS) ×2
DERMABOND ADVANCED .7 DNX12 (GAUZE/BANDAGES/DRESSINGS) ×1 IMPLANT
DEVICE DSSCT PLSMBLD 3.0S LGHT (MISCELLANEOUS) ×1 IMPLANT
DEVICE DUBIN SPECIMEN MAMMOGRA (MISCELLANEOUS) ×3 IMPLANT
DRAPE LAPAROTOMY 77X122 PED (DRAPES) ×3 IMPLANT
ELECT CAUTERY BLADE TIP 2.5 (TIP) ×3
ELECT REM PT RETURN 9FT ADLT (ELECTROSURGICAL) ×3
ELECTRODE CAUTERY BLDE TIP 2.5 (TIP) ×1 IMPLANT
ELECTRODE REM PT RTRN 9FT ADLT (ELECTROSURGICAL) ×1 IMPLANT
GAUZE 4X4 16PLY ~~LOC~~+RFID DBL (SPONGE) ×3 IMPLANT
GLOVE SURG SYN 6.5 ES PF (GLOVE) ×9 IMPLANT
GLOVE SURG SYN 6.5 PF PI (GLOVE) ×2 IMPLANT
GLOVE SURG UNDER POLY LF SZ7 (GLOVE) ×7 IMPLANT
GOWN STRL REUS W/ TWL LRG LVL3 (GOWN DISPOSABLE) ×3 IMPLANT
GOWN STRL REUS W/TWL LRG LVL3 (GOWN DISPOSABLE) ×9
KIT MARKER MARGIN INK (KITS) ×3 IMPLANT
KIT TURNOVER KIT A (KITS) ×3 IMPLANT
LABEL OR SOLS (LABEL) ×3 IMPLANT
LIGHT WAVEGUIDE WIDE FLAT (MISCELLANEOUS) IMPLANT
MANIFOLD NEPTUNE II (INSTRUMENTS) ×3 IMPLANT
MARKER MARGIN CORRECT CLIP (MARKER) ×3 IMPLANT
NEEDLE HYPO 22GX1.5 SAFETY (NEEDLE) ×6 IMPLANT
PACK BASIN MINOR ARMC (MISCELLANEOUS) ×3 IMPLANT
PLASMABLADE 3.0S W/LIGHT (MISCELLANEOUS) ×3
SET LOCALIZER 20 PROBE US (MISCELLANEOUS) ×3 IMPLANT
SUT MNCRL 4-0 (SUTURE) ×6
SUT MNCRL 4-0 27XMFL (SUTURE) ×2
SUT SILK 3 0 12 30 (SUTURE) IMPLANT
SUT VIC AB 3-0 SH 27 (SUTURE) ×6
SUT VIC AB 3-0 SH 27X BRD (SUTURE) ×2 IMPLANT
SUTURE MNCRL 4-0 27XMF (SUTURE) ×2 IMPLANT
SYR 20ML LL LF (SYRINGE) ×3 IMPLANT
SYR BULB IRRIG 60ML STRL (SYRINGE) ×3 IMPLANT
TRAP NEPTUNE SPECIMEN COLLECT (MISCELLANEOUS) ×3 IMPLANT
TUBING CONNECTING 10 (TUBING) ×2 IMPLANT
TUBING CONNECTING 10' (TUBING) ×1
WATER STERILE IRR 1000ML POUR (IV SOLUTION) ×3 IMPLANT
WATER STERILE IRR 500ML POUR (IV SOLUTION) ×3 IMPLANT

## 2021-05-22 NOTE — Interval H&P Note (Signed)
History and Physical Interval Note:  05/22/2021 12:09 PM  Regina Blanchard  has presented today for surgery, with the diagnosis of Malignant neoplasm of upper-inner quadrant of right breast in female, estrogen receptor positive (CMS-HCC) C50.211, Z17.0.  The various methods of treatment have been discussed with the patient and family. After consideration of risks, benefits and other options for treatment, the patient has consented to  Procedure(s): Re-excision [place] (Right) as a surgical intervention.  The patient's history has been reviewed, patient examined, no change in status, stable for surgery.  I have reviewed the patient's chart and labs.  Questions were answered to the patient's satisfaction.     [place] Blue Lysle Pearl

## 2021-05-22 NOTE — Discharge Instructions (Addendum)
Removal, Care After This sheet gives you information about how to care for yourself after your procedure. Your health care provider may also give you more specific instructions. If you have problems or questions, contact your health care provider. What can I expect after the procedure? After the procedure, it is common to have: Soreness. Bruising. Itching. Follow these instructions at home: site care Follow instructions from your health care provider about how to take care of your site. Make sure you: Wash your hands with soap and water before and after you change your bandage (dressing). If soap and water are not available, use hand sanitizer. Leave stitches (sutures), skin glue, or adhesive strips in place. These skin closures may need to stay in place for 2 weeks or longer. If adhesive strip edges start to loosen and curl up, you may trim the loose edges. Do not remove adhesive strips completely unless your health care provider tells you to do that. If the area bleeds or bruises, apply gentle pressure for 10 minutes. OK TO SHOWER IN 24HRS  Check your site every day for signs of infection. Check for: Redness, swelling, or pain. Fluid or blood. Warmth. Pus or a bad smell.  General instructions Rest and then return to your normal activities as told by your health care provider. RESUME ASPIRIN IN 48HRS  tylenol and advil as needed for discomfort.  Please alternate between the two every four hours as needed for pain.    Use narcotics, if prescribed, only when tylenol and motrin is not enough to control pain.  325-650mg  every 8hrs to max of 3000mg /24hrs (including the 325mg  in every norco dose) for the tylenol.    Advil up to 800mg  per dose every 8hrs as needed for pain.   Keep all follow-up visits as told by your health care provider. This is important. Contact a health care provider if: You have redness, swelling, or pain around your site. You have fluid or blood coming from your  site. Your site feels warm to the touch. You have pus or a bad smell coming from your site. You have a fever. Your sutures, skin glue, or adhesive strips loosen or come off sooner than expected. Get help right away if: You have bleeding that does not stop with pressure or a dressing. Summary After the procedure, it is common to have some soreness, bruising, and itching at the site. Follow instructions from your health care provider about how to take care of your site. Check your site every day for signs of infection. Contact a health care provider if you have redness, swelling, or pain around your site, or your site feels warm to the touch. Keep all follow-up visits as told by your health care provider. This is important. This information is not intended to replace advice given to you by your health care provider. Make sure you discuss any questions you have with your health care provider. Document Released: 06/07/2015 Document Revised: 11/08/2017 Document Reviewed: 11/08/2017 Elsevier Interactive Patient Education  2019 [place]   The drugs that you were given will stay in your system until tomorrow so for the next 24 hours you should not:  Drive an automobile Make any legal decisions Drink any alcoholic beverage   You may resume regular meals tomorrow.  Today it is better to start with liquids and gradually work up to solid foods.  You may eat anything you prefer, but it is better to start with liquids, then  soup and crackers, and gradually work up to solid foods.   Please notify your doctor immediately if you have any unusual bleeding, trouble breathing, redness and pain at the surgery site, drainage, fever, or pain not relieved by medication.    Your post-operative visit with Dr.                                       is: Date:                        Time:    Please call to schedule your post-operative  visit.  Additional Instructions:

## 2021-05-22 NOTE — Anesthesia Preprocedure Evaluation (Signed)
Anesthesia Evaluation  Patient identified by MRN, date of birth, ID band Patient awake    Reviewed: Allergy & Precautions, NPO status , Patient's Chart, lab work & pertinent test results  History of Anesthesia Complications (+) PONV and history of anesthetic complications  Airway Mallampati: I  TM Distance: >3 FB Neck ROM: Full    Dental  (+) Upper Dentures, Implants, Caps Cemented dentures/implants:   Pulmonary neg pulmonary ROS, neg sleep apnea, neg COPD, Patient abstained from smoking.Not current smoker,    Pulmonary exam normal breath sounds clear to auscultation       Cardiovascular Exercise Tolerance: Good METShypertension, Pt. on medications (-) CAD and (-) Past MI (-) dysrhythmias  Rhythm:Regular Rate:Normal - Systolic murmurs    Neuro/Psych negative neurological ROS  negative psych ROS   GI/Hepatic neg GERD  ,(+)     (-) substance abuse  ,   Endo/Other  neg diabetes  Renal/GU negative Renal ROS     Musculoskeletal  (+) Arthritis ,   Abdominal   Peds  Hematology   Anesthesia Other Findings Past Medical History: No date: Back pain 2004: Breast cancer ([place])     Comment:  right breast cancer No date: Cervical polyp No date: Degenerative arthritis of right knee No date: Diverticulosis No date: Family history of adverse reaction to anesthesia     Comment:  brother became combative in post op No date: Pneumonia     Comment:  as a child "drank kerosine when a baby" No date: PONV (postoperative nausea and vomiting)     Comment:  x1 nausea only No date: Status post radiation therapy     Comment:  2004 breast cancer No date: Vitamin D deficiency  Reproductive/Obstetrics                             Anesthesia Physical Anesthesia Plan  ASA: 2  Anesthesia Plan: General   Post-op Pain Management: Toradol IV (intra-op) and Ofirmev IV (intra-op)   Induction: Intravenous  PONV  Risk Score and Plan: 4 or greater and Ondansetron, Dexamethasone, Midazolam, TIVA, Propofol infusion and Aprepitant  Airway Management Planned: Oral ETT  Additional Equipment: None  Intra-op Plan:   Post-operative Plan: Extubation in OR  Informed Consent: I have reviewed the patients History and Physical, chart, labs and discussed the procedure including the risks, benefits and alternatives for the proposed anesthesia with the patient or authorized representative who has indicated his/her understanding and acceptance.     Dental advisory given  Plan Discussed with: CRNA and Surgeon  Anesthesia Plan Comments: (Discussed risks of anesthesia with patient, including PONV, sore throat, lip/dental/eye damage. Rare risks discussed as well, such as cardiorespiratory and neurological sequelae, and allergic reactions. Discussed the role of CRNA in patient's perioperative care. Patient understands.)        Anesthesia Quick Evaluation

## 2021-05-22 NOTE — Transfer of Care (Signed)
Immediate Anesthesia Transfer of Care Note  Patient: Regina Blanchard  Procedure(s) Performed: Re-excision BREAST LUMPECTOMY,RADIO FREQ LOCALIZER,AXILLARY SENTINEL LYMPH NODE BIOPSY (Right)  Patient Location: PACU  Anesthesia Type:General  Level of Consciousness: awake, alert  and oriented  Airway & Oxygen Therapy: Patient Spontanous Breathing and Patient connected to face mask oxygen  Post-op Assessment: Report given to RN and Post -op Vital signs reviewed and stable  Post vital signs: Reviewed and stable  Last Vitals:  Vitals Value Taken Time  BP 120/59 05/22/21 1413  Temp 36.1 C 05/22/21 1414  Pulse 72 05/22/21 1415  Resp 13 05/22/21 1415  SpO2 100 % 05/22/21 1415  Vitals shown include unvalidated device data.  Last Pain:  Vitals:   05/22/21 1024  TempSrc: Temporal  PainSc: 0-No pain      Patients Stated Pain Goal: 0 (84/78/41 2820)  Complications: No notable events documented.

## 2021-05-22 NOTE — Anesthesia Postprocedure Evaluation (Signed)
Anesthesia Post Note  Patient: Regina Blanchard  Procedure(s) Performed: Re-excision BREAST LUMPECTOMY,RADIO FREQ LOCALIZER,AXILLARY SENTINEL LYMPH NODE BIOPSY (Right)  Patient location during evaluation: PACU Anesthesia Type: General Level of consciousness: awake and alert, awake and oriented Pain management: pain level controlled Vital Signs Assessment: post-procedure vital signs reviewed and stable Respiratory status: spontaneous breathing, nonlabored ventilation and respiratory function stable Cardiovascular status: blood pressure returned to baseline and stable Postop Assessment: no apparent nausea or vomiting Anesthetic complications: no   No notable events documented.   Last Vitals:  Vitals:   05/22/21 1430 05/22/21 1440  BP: 133/70 (!) 148/69  Pulse: 72 67  Resp: 17 12  Temp:  36.5 C  SpO2: 96% 96%    Last Pain:  Vitals:   05/22/21 1430  TempSrc:   PainSc: 3                  Phill Mutter

## 2021-05-22 NOTE — Op Note (Addendum)
Preoperative diagnosis:  RIGHT breast carcinoma.  Postoperative diagnosis: same.   Procedure: RF tag-localized right breast partial mastectomy.                      right Axillary Sentinel Lymph node biopsy, methylene blue injection  Anesthesia: GETA  Surgeon: Dr. Benjamine Sprague  Wound Classification: Clean  Indications: Patient is a 70 y.o. female with a nonpalpable right breast mass noted on mammography with core biopsy demonstrating CA. requires RF localizer placement, partial mastectomy for treatment with sentinel lymph node biopsy.   Specimen: right Breast mass, Sentinel Lymph nodes x 1, anterior margin  Complications: None  Estimated Blood Loss: 49mL  Findings: 1. Specimen mammography shows marker and RF localizer on specimen 2. Pathology call refers gross examination of margins was negative with anterior margin within 39mm, therefore additional anterior margins removed 3. No LN with blue dye or tracer uptake.  One palpable LN.  No other palpable mass or lymph node identified.   Operation performed with curative intent:Yes  Tracer(s) used to identify sentinel nodes in the upfront surgery (non-neoadjuvant) setting (select all that apply):Dye and Radioactive Tracer  Tracer(s) used to identify sentinel nodes in the neoadjuvant setting (select all that apply):N/A  All nodes (colored or non-colored) present at the end of a dye-filled lymphatic channel were removed:Yes   All significantly radioactive nodes were removed:N/A  All palpable suspicious nodes were removed:Yes  Biopsy-proven positive nodes marked with clips prior to chemotherapy were identified and removed:N/A    Description of procedure: RF localization was performed by radiology prior to procedure. In the nuclear medicine suite, the subareolar region was injected with Tc-99 sulfur colloid the morning of procedure. Localization studies were reviewed. The patient was taken to the operating room and placed supine on the  operating table, and after general anesthesia the right breast and axilla were prepped and draped in the usual sterile fashion. A time-out was completed verifying correct patient, procedure, site, positioning, and implant(s) and/or special equipment prior to beginning this procedure.  Inspection of the axilla with gamma probe did not note a hot spot, possibly due to previous surgery and radiation.  Blue dye was injected in the sub-dermal layer, periareolar region, 77ml.  Breast was massaged for 54min.  The blue dye unfortunately noted to be causing some significant staining in the skin, despite sub dermal injection.  Procedure continued as below in hopes of some pickup by lymph nodes.  By identifying the RF localizer, the probable trajectory and location of the mass was visualized. A skin incision was planned in such a way as to minimize the amount of dissection to reach the mass.  The skin incision was made after infusion of local. Flaps were raised and  Sharp and blunt dissection was then taken down to the mass, taking care to include the entire RF localizer and a margin of grossly normal tissue. The specimen was removed. The specimen was oriented with paint and sent to radiology with the localization studies. Confirmation was received that the entire target lesion had been resected, but anterior margin noted to be within a couple mm of lesion, so additional anterior margin removed, oriented with paint, and sent to pathology.  Identification of hot spot with probe proved unsuccessful again at this point.  Counts measured 100-150s in the hight counts compared to areas on 60s-80s.   An incision was made around the caudal axillary hairline, in the area where over 100count was noted.  Sharp and blunt  Dissection was carried down to subdermal facias and axillary space entered.  No additional hot spots were identified despite probing of area.  No blue channels or nodes were noted either.  Moderate scarring was noted  within this area, unsure if from previous radiation.  There was felt like a larger, palpable node compared to surrounding tissue, which had lymphatic channels extending into it.  This was excised completely and sent as sentinel lymph node. One last inspection of area, again did not note any blue dye or hot spots via gamma probe.  Procedure aborted at this point.  Both wounds irrigated, hemostasis was achieved and the wound closed in layers with  interrupted sutures of 3-0 Vicryl in deep dermal layer and a running subcuticular suture of Monocryl 4-0, then dressed with dermabond. The blue dye was noted to be expanding beyond periareolar region by end of procedure.  No obvious injuries or skin reactions noted at time procedure ended.   The patient tolerated the procedure well and was taken to the postanesthesia care unit in stable condition. Sponge and instrument count correct at end of procedure.

## 2021-05-22 NOTE — Anesthesia Procedure Notes (Signed)
Procedure Name: LMA Insertion Date/Time: 05/22/2021 12:43 PM Performed by: Lily Peer, Gabriella Woodhead, CRNA Pre-anesthesia Checklist: Patient identified, Emergency Drugs available, Suction available and Patient being monitored Patient Re-evaluated:Patient Re-evaluated prior to induction Oxygen Delivery Method: Circle system utilized Preoxygenation: Pre-oxygenation with 100% oxygen Induction Type: IV induction Ventilation: Mask ventilation without difficulty LMA: LMA inserted LMA Size: 4.0 Number of attempts: 3 Placement Confirmation: positive ETCO2 and breath sounds checked- equal and bilateral Tube secured with: Tape Dental Injury: Teeth and Oropharynx as per pre-operative assessment  Comments: LMA 3.5 placed with significant leak, LMA 4 placed.

## 2021-05-23 ENCOUNTER — Encounter: Payer: Self-pay | Admitting: Surgery

## 2021-05-23 ENCOUNTER — Encounter: Payer: Self-pay | Admitting: *Deleted

## 2021-05-24 ENCOUNTER — Other Ambulatory Visit: Payer: Self-pay

## 2021-05-24 ENCOUNTER — Telehealth: Payer: Self-pay | Admitting: General Surgery

## 2021-05-24 ENCOUNTER — Emergency Department: Payer: Medicare PPO

## 2021-05-24 ENCOUNTER — Emergency Department
Admission: EM | Admit: 2021-05-24 | Discharge: 2021-05-24 | Disposition: A | Discharge status: Home / Self Care | Payer: Medicare PPO | Attending: Emergency Medicine | Admitting: Emergency Medicine

## 2021-05-24 DIAGNOSIS — I82431 Acute embolism and thrombosis of right popliteal vein: Secondary | ICD-10-CM | POA: Diagnosis not present

## 2021-05-24 DIAGNOSIS — Z96653 Presence of artificial knee joint, bilateral: Secondary | ICD-10-CM | POA: Diagnosis not present

## 2021-05-24 DIAGNOSIS — Z79899 Other long term (current) drug therapy: Secondary | ICD-10-CM | POA: Insufficient documentation

## 2021-05-24 DIAGNOSIS — Z853 Personal history of malignant neoplasm of breast: Secondary | ICD-10-CM | POA: Diagnosis not present

## 2021-05-24 DIAGNOSIS — M79604 Pain in right leg: Secondary | ICD-10-CM | POA: Diagnosis present

## 2021-05-24 DIAGNOSIS — Z7901 Long term (current) use of anticoagulants: Secondary | ICD-10-CM | POA: Diagnosis not present

## 2021-05-24 HISTORY — DX: Acute embolism and thrombosis of right popliteal vein: I82.431

## 2021-05-24 MED ORDER — APIXABAN 5 MG PO TABS
10.0000 mg | ORAL_TABLET | Freq: Once | ORAL | Status: AC
  Administered 2021-05-24: 10 mg via ORAL
  Filled 2021-05-24: qty 2

## 2021-05-24 MED ORDER — APIXABAN (ELIQUIS) VTE STARTER PACK (10MG AND 5MG): ORAL_TABLET | ORAL | 0 refills | Status: DC

## 2021-05-24 NOTE — Telephone Encounter (Signed)
Patient called the answering service today complaining of left leg pain.  She endorses that she woke up with significant pain in her left leg.  She has not been able to put weight on her leg today.  She needs to walk with support.  It has been acute.  She was doing well yesterday.  Patient endorses that she had left knee surgery but these feel different that her pain when she had issues with her knee.  She has been followed by her orthopedic surgeon and everything has been going well from the knee standpoint.  Endorses having mild swelling of the leg.  Patient had partial mastectomy 3 days ago.  I discussed with patient concern of deep venous thrombosis.  Patient will need ultrasound.  Since there is no outpatient ultrasound available at this moment I recommend the patient to present to the emergency room for further evaluation.  At the ED ultrasound can be done plus other causes of leg pain that are not related to her partial mastectomy can be addressed.  Patient endorses that she will present to the emergency room.

## 2021-05-24 NOTE — ED Notes (Signed)
E signature pad not working. Pt educated on discharge instructions and verbalized understanding.  

## 2021-05-24 NOTE — ED Triage Notes (Signed)
Pt states she had surgery on Thursday and today had sudden onset pain in her R calf- pt states it hurts to stand on that leg

## 2021-05-24 NOTE — Discharge Instructions (Signed)
Return to the ER for increase in pain or shortness of breath.

## 2021-05-24 NOTE — ED Provider Notes (Signed)
[facility] Emergency Department Provider Note ____________________________________________   Event Date/Time   First MD Initiated Contact with Patient 05/24/21 1549     (approximate)  I have reviewed the triage vital signs and the nursing notes.   HISTORY  Chief Complaint Leg Pain  HPI Regina Blanchard is a 70 y.o. female with history of invasive ductal carcinoma of the right breast presents to the emergency department for treatment and evaluation of right calf pain after breast excision and lumpectomy on 05/22/21. She awakened this morning and had pain upon standing and trying to walk. No history of DVT/PE. She took 325mg  of ASA this morning. Surgeon recommended that she come to the ER for ultrasound.         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[place]) 05/04/2021   Total knee replacement status 02/10/2021   Chronic low back pain 02/05/2021   Status post total right knee replacement 07/06/2018   Hyperlipidemia, mixed 01/21/2018   Carcinoma in situ of right breast 01/27/2017   Fibrocystic breast disease 01/27/2017   Vitamin D deficiency, unspecified 01/08/2014    Past Surgical History:  Procedure Laterality Date   BREAST BIOPSY Left    1991 negative 2006 negative   BREAST BIOPSY Bilateral    BREAST BIOPSY  04/29/2021   X2  12:00 heart-fat necrosis, 12:30 coil-[facility]   BREAST EXCISIONAL BIOPSY Right    positive 2004     BREAST LUMPECTOMY Right 2004   BREAST LUMPECTOMY,RADIO FREQ LOCALIZER,AXILLARY SENTINEL LYMPH NODE BIOPSY Right 05/22/2021   Procedure: Re-excision BREAST LUMPECTOMY,RADIO FREQ LOCALIZER,AXILLARY SENTINEL LYMPH NODE BIOPSY;  Surgeon: Benjamine Sprague, DO;  Location: [facility] ORS;  Service: General;  Laterality: Right;   COLONOSCOPY     2008, 2011, 2022   DILATION AND CURETTAGE OF UTERUS     EXPLORATORY LAPAROTOMY  1991   infertility   KNEE ARTHROPLASTY Right 07/06/2018   Procedure: COMPUTER ASSISTED TOTAL KNEE ARTHROPLASTY;  Surgeon: Dereck Leep, MD;  Location: [facility] ORS;  Service: Orthopedics;  Laterality: Right;   KNEE ARTHROPLASTY Left 02/10/2021   Procedure: COMPUTER ASSISTED TOTAL KNEE ARTHROPLASTY;  Surgeon: Dereck Leep, MD;  Location: [facility] ORS;  Service: Orthopedics;  Laterality: Left;   SQUINT REPAIR Left 1960   age 75 (CROSS EYE SURGERY)    Prior to Admission medications   Medication Sig Start Date End Date Taking? Authorizing Provider  APIXABAN Arne Cleveland) VTE STARTER PACK (10MG  AND 5MG ) Take as directed on package: start with two-5mg  tablets twice daily for 7 days. On day 8, switch to one-5mg  tablet twice daily. 05/24/21  Yes Doug Bucklin B, FNP  acetaminophen (TYLENOL) 325 MG tablet Take 2 tablets (650 mg total) by mouth every 8 (eight) hours as needed for mild pain. 05/22/21 06/21/21  Benjamine Sprague, DO  ALPRAZolam Duanne Moron) 0.25 MG tablet Take  0.25 mg by mouth daily as needed for anxiety or sleep.  04/03/16   [provider]  baclofen (LIORESAL) 10 MG tablet Take 10 mg by mouth daily as needed for muscle spasms.  01/20/17   [provider]  Cholecalciferol (VITAMIN D) 125 MCG (5000 UT) CAPS Take 5,000 Units by mouth 3 (three) times a week.    [provider]  docusate sodium (COLACE) 100 MG capsule Take 1 capsule (100 mg total) by mouth 2 (two) times daily as needed for up to 10 days for mild constipation. 05/22/21 06/01/21  Benjamine Sprague, DO  hydrochlorothiazide  (HYDRODIURIL) 25 MG tablet Take 25 mg by mouth daily as needed (swelling). 01/20/17   [provider]  HYDROcodone-acetaminophen (NORCO) 5-325 MG tablet Take 1 tablet by mouth every 6 (six) hours as needed for up to 6 doses for moderate pain. 05/22/21   Benjamine Sprague, DO  Multiple Vitamin (MULTIVITAMIN) capsule Take 4 capsules by mouth 3 (three) times a week. Balance of nature 2 fruit 2 veg.    [provider]  ondansetron (ZOFRAN-ODT) 4 MG disintegrating tablet Take 4 mg by mouth every 8 (eight) hours as needed for nausea/vomiting, vomiting or nausea. 01/03/21   [provider]  tiZANidine (ZANAFLEX) 4 MG tablet Take 4 mg by mouth 2 (two) times daily as needed for muscle spasms.    [provider]    Allergies Patient has no known allergies.  Family History  Problem Relation Age of Onset   Breast cancer Maternal Aunt 53   Diabetes Mother    Heart disease Father    Diabetes Sister    Breast cancer Paternal Aunt    Colon cancer Maternal Aunt    Hypertension Maternal Aunt     Social History Social History   Tobacco Use   Smoking status: Never   Smokeless tobacco: Never  Vaping Use   Vaping Use: Never used  Substance Use Topics   Alcohol use: No   Drug use: No    Review of Systems  Constitutional: No fever/chills Eyes: No visual changes. ENT: No sore throat. Cardiovascular: Denies chest pain. Respiratory: Denies shortness of breath. Gastrointestinal: No abdominal pain.  No nausea, no vomiting.  No diarrhea.  No constipation. Genitourinary: Negative for dysuria. Musculoskeletal: Positive for right calf pain. Skin: Negative for rash. Neurological: Negative for headaches, focal weakness or numbness ____________________________________________   PHYSICAL EXAM:  VITAL SIGNS: ED Triage Vitals  Enc Vitals Group     BP 05/24/21 1440 (!) 153/94     Pulse Rate 05/24/21 1440 100     Resp 05/24/21 1440 18     Temp 05/24/21 1440 98.3 F  (36.8 C)     Temp Source 05/24/21 1440 Oral     SpO2 05/24/21 1440 97 %     Weight 05/24/21 1442 187 lb (84.8 kg)     Height 05/24/21 1442 5\' 8"  (1.727 m)     Head Circumference --      Peak Flow --      Pain Score 05/24/21 1441 5     Pain Loc --      Pain Edu? --      Excl. in [place]? --     Constitutional: Alert and oriented. Well appearing and in no acute distress. Eyes: Conjunctivae are normal.  Head: Atraumatic. Nose: No congestion/rhinnorhea. Mouth/Throat: Mucous membranes are moist. Neck: No stridor.   Hematological/Lymphatic/Immunilogical: No cervical lymphadenopathy. Cardiovascular: Normal rate, regular rhythm. Grossly normal heart sounds.  Good peripheral  circulation. Respiratory: Normal respiratory effort.  No retractions. Lungs CTAB. Gastrointestinal: Soft and nontender. Musculoskeletal: Intermittent claudication right calf. No apparent edema or erythema. Homan's sign is positive. Neurologic:  Normal speech and language. No gross focal neurologic deficits are appreciated. No gait instability. Skin:  Skin is warm, dry and intact. No rash noted. Psychiatric: Mood and affect are normal. Speech and behavior are normal.  ____________________________________________   LABS (all labs ordered are listed, but only abnormal results are displayed)  Labs Reviewed - No data to display ____________________________________________  EKG   ____________________________________________  RADIOLOGY  ED MD interpretation:    Ultrasound of the right lower extremity shows a short segmental nonocclusive thrombus in one of the 2 duplicated popliteal veins.   I, Sherrie George, personally viewed and evaluated these images (plain radiographs) as part of my medical decision making, as well as reviewing the written report by the radiologist.  Official radiology report(s): US Venous Img Lower Unilateral Right  Result Date: 05/24/2021 CLINICAL DATA:  R calf pain EXAM: RIGHT LOWER  EXTREMITY VENOUS DOPPLER ULTRASOUND TECHNIQUE: Gray-scale sonography with graded compression, as well as color Doppler and duplex ultrasound were performed to evaluate the lower extremity deep venous systems from the level of the common femoral vein and including the common femoral, femoral, profunda femoral, popliteal and calf veins including the posterior tibial, peroneal and gastrocnemius veins when visible. The superficial great saphenous vein was also interrogated. Spectral Doppler was utilized to evaluate flow at rest and with distal augmentation maneuvers in the common femoral, femoral and popliteal veins. COMPARISON:  None. FINDINGS: Contralateral Common Femoral Vein: Respiratory phasicity is normal and symmetric with the symptomatic side. No evidence of thrombus. Normal compressibility. Common Femoral Vein: No evidence of thrombus. Normal compressibility, respiratory phasicity and response to augmentation. Saphenofemoral Junction: No evidence of thrombus. Normal compressibility and flow on color Doppler imaging. Profunda Femoral Vein: No evidence of thrombus. Normal compressibility and flow on color Doppler imaging. Femoral Vein: No evidence of thrombus. Normal compressibility, respiratory phasicity and response to augmentation. Popliteal Vein: There is short segment nonocclusive thrombus seen 1 of 2 duplicated popliteal veins. The second popliteal vein is widely patent. Calf Veins: No evidence of thrombus. Normal compressibility and flow on color Doppler imaging. Superficial Great Saphenous Vein: No evidence of thrombus. Normal compressibility. Venous Reflux:  None. Other Findings:  None. IMPRESSION: There appears to be short-segment nonocclusive thrombus seen in 1 of 2 duplicated popliteal veins. The other popliteal vein is widely patent without thrombus. No findings of below-the-knee DVT in the calf veins. Electronically Signed   By: Albin Felling M.D.   On: 05/24/2021 18:46     ____________________________________________   PROCEDURES  Procedure(s) performed (including Critical Care):  Procedures  ____________________________________________   INITIAL IMPRESSION / ASSESSMENT AND PLAN     70 year old female presenting to the emergency department for treatment and evaluation of cute onset right lower extremity pain.  See HPI for further details.  Awaiting ultrasound.  DIFFERENTIAL DIAGNOSIS  Musculoskeletal pain, DVT  ED COURSE  Ultrasound shows a nonocclusive popliteal DVT on the right lower extremity.  Results were discussed with the patient and husband.  She will start Eliquis tonight and schedule a follow-up appointment with vascular.  She was advised to return to the emergency department if she develops any shortness of breath, worsening pain in the leg, or other concerns if unable to see primary care or her specialist.     As part of my medical decision making, I reviewed the  following data within the [facility] chart reviewed ___________________________________________   FINAL CLINICAL IMPRESSION(S) / ED DIAGNOSES  Final diagnoses:  Acute deep vein thrombosis (DVT) of popliteal vein of right lower extremity [facility])     ED Discharge Orders          Ordered    APIXABAN (ELIQUIS) VTE STARTER PACK (10MG  AND 5MG )        05/24/21 1904             Regina Blanchard was evaluated in Emergency Department on 05/24/2021 for the symptoms described in the history of present illness. She was evaluated in the context of the global COVID-19 pandemic, which necessitated consideration that the patient might be at risk for infection with the SARS-CoV-2 virus that causes COVID-19. Institutional protocols and algorithms that pertain to the evaluation of patients at risk for COVID-19 are in a state of rapid change based on information released by regulatory bodies including the CDC and federal and state organizations. These policies  and algorithms were followed during the patient's care in the ED.   Note:  This document was prepared using Dragon voice recognition software and may include unintentional dictation errors.    Victorino Dike, FNP 05/24/21 Gerda Diss, MD 05/24/21 [phone]

## 2021-06-01 NOTE — Progress Notes (Signed)
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  INTERVAL HISTORY: Patient returns to clinic today for further evaluation and additional treatment planning.  She underwent lumpectomy on May 22, 2021 and was noted to have positive margins and has decided to return to surgery for reexcision.  She currently feels well and is asymptomatic. She has no neurologic complaints.  She denies any recent fevers or illnesses.  She has a good appetite and denies weight loss.  She has no chest pain, shortness of breath, cough, or hemoptysis.  She denies any nausea, vomiting, constipation, or diarrhea.  She has no urinary complaints.  Patient offers no further specific complaints today.  REVIEW OF SYSTEMS:   Review of Systems  Constitutional: Negative.  Negative for fever, malaise/fatigue and weight loss.  Respiratory: Negative.  Negative for cough and shortness of breath.   Cardiovascular: Negative.  Negative for chest pain and leg swelling.  Gastrointestinal: Negative.  Negative for abdominal pain, blood in stool and melena.  Genitourinary:  Negative for dysuria.  Musculoskeletal: Negative.  Negative for back pain.  Skin: Negative.  Negative for rash.  Neurological: Negative.  Negative for dizziness, focal weakness, weakness and headaches.  Psychiatric/Behavioral: Negative.  The patient is not nervous/anxious.    As per HPI. Otherwise, a complete review of systems is negative.  PAST MEDICAL HISTORY: Past Medical History:  Diagnosis Date   Back pain    Breast cancer ([place]) 2004   right breast cancer   Cervical polyp    Degenerative arthritis of right knee    Diverticulosis    Family history of adverse  reaction to anesthesia    brother became combative in post op   Pneumonia    as a child "drank kerosine when a baby"   PONV (postoperative nausea and vomiting)    x1 nausea only   Status post radiation therapy    2004 breast cancer   Vitamin D deficiency     PAST SURGICAL HISTORY: Past Surgical History:  Procedure Laterality Date   BREAST BIOPSY Left    1991 negative 2006 negative   BREAST BIOPSY Bilateral    BREAST BIOPSY  04/29/2021   X2  12:00 heart-fat necrosis, 12:30 coil-[facility]   BREAST EXCISIONAL BIOPSY Right    positive 2004    BREAST LUMPECTOMY Right 2004   BREAST LUMPECTOMY,RADIO FREQ LOCALIZER,AXILLARY SENTINEL LYMPH NODE BIOPSY Right 05/22/2021   Procedure: Re-excision BREAST LUMPECTOMY,RADIO FREQ LOCALIZER,AXILLARY SENTINEL LYMPH NODE BIOPSY;  Surgeon: Benjamine Sprague, DO;  Location: [facility] ORS;  Service: General;  Laterality: Right;   COLONOSCOPY     2008, 2011, 2022   [facility]   infertility   KNEE ARTHROPLASTY Right 07/06/2018   Procedure: COMPUTER ASSISTED TOTAL KNEE ARTHROPLASTY;  Surgeon: Dereck Leep, MD;  Location: [facility] OR[facility];  Service: Orthopedics;  Laterality: Right;   KNEE ARTHROPLASTY Left 02/10/2021   Procedure: COMPUTER ASSISTED TOTAL KNEE ARTHROPLASTY;  Surgeon: Dereck Leep, MD;  Location: [facility] ORS;  Service: Orthopedics;  Laterality: Left;   SQUINT REPAIR Left 1960   age 33 (CROSS EYE SURGERY)    FAMILY HISTORY: Family History  Problem Relation Age of Onset   Breast cancer Maternal  Aunt 58   Diabetes Mother    Heart disease Father    Diabetes Sister    Breast cancer Paternal Aunt    Colon cancer Maternal Aunt    Hypertension Maternal Aunt     ADVANCED DIRECTIVES (Y/N):  N  HEALTH MAINTENANCE: Social History   Tobacco Use   Smoking status: Never   Smokeless tobacco: Never  Vaping Use   Vaping Use: Never used  Substance Use Topics   Alcohol use: No   Drug use: No      Colonoscopy:  PAP:  Bone density:  Lipid panel:  No Known Allergies  Current Outpatient Medications  Medication Sig Dispense Refill   acetaminophen (TYLENOL) 325 MG tablet Take 2 tablets (650 mg total) by mouth every 8 (eight) hours as needed for mild pain. 40 tablet 0   ALPRAZolam (XANAX) 0.25 MG tablet Take 0.25 mg by mouth daily as needed for anxiety or sleep.      APIXABAN (ELIQUIS) VTE STARTER PACK (10MG AND 5MG) Take as directed on package: start with two-41m tablets twice daily for 7 days. On day 8, switch to one-56mtablet twice daily. 1 each 0   baclofen (LIORESAL) 10 MG tablet Take 10 mg by mouth daily as needed for muscle spasms.      Cholecalciferol (VITAMIN D) 125 MCG (5000 UT) CAPS Take 5,000 Units by mouth 3 (three) times a week.     hydrochlorothiazide (HYDRODIURIL) 25 MG tablet Take 25 mg by mouth daily as needed (swelling).     HYDROcodone-acetaminophen (NORCO) 5-325 MG tablet Take 1 tablet by mouth every 6 (six) hours as needed for up to 6 doses for moderate pain. 6 tablet 0   Multiple Vitamin (MULTIVITAMIN) capsule Take 4 capsules by mouth 3 (three) times a week. Balance of nature 2 fruit 2 veg.     olmesartan-hydrochlorothiazide (BENICAR HCT) 20-12.5 MG tablet Take 1 tablet by mouth daily.     ondansetron (ZOFRAN-ODT) 4 MG disintegrating tablet Take 4 mg by mouth every 8 (eight) hours as needed for nausea/vomiting, vomiting or nausea.     tiZANidine (ZANAFLEX) 4 MG tablet Take 4 mg by mouth 2 (two) times daily as needed for muscle spasms.     No current facility-administered medications for this visit.    OBJECTIVE: Vitals:   06/05/21 1107  BP: (!) 144/82  Pulse: 75  Resp: 17  Temp: (!) 97 F (36.1 C)  SpO2: 100%     Body mass index is 28.74 kg/m.    ECOG FS:0 - Asymptomatic  General: Well-developed, well-nourished, no acute distress. Eyes: Pink conjunctiva, anicteric sclera. HEENT: Normocephalic, moist mucous membranes. Breast: Exam deferred  today. Lungs: No audible wheezing or coughing. Heart: Regular rate and rhythm. Abdomen: Soft, nontender, no obvious distention. Musculoskeletal: No edema, cyanosis, or clubbing. Neuro: Alert, answering all questions appropriately. Cranial nerves grossly intact. Skin: No rashes or petechiae noted. Psych: Normal affect.  LAB RESULTS:  Lab Results  Component Value Date   NA 139 06/22/2018   K 4.1 05/15/2021   CL 99 06/22/2018   CO2 32 06/22/2018   GLUCOSE 120 (H) 06/22/2018   BUN 19 06/22/2018   CREATININE 0.71 06/22/2018   CALCIUM 9.8 06/22/2018   PROT 7.2 06/22/2018   ALBUMIN 4.1 06/22/2018   AST 25 06/22/2018   ALT 25 06/22/2018   ALKPHOS 90 06/22/2018   BILITOT 1.0 06/22/2018   GFRNONAA >60 06/22/2018   GFRAA >60 06/22/2018    Lab Results  Component Value Date  WBC 4.3 06/22/2018   NEUTROABS 1.9 12/31/2011   HGB 13.7 06/22/2018   HCT 43.1 06/22/2018   MCV 91.1 06/22/2018   PLT 190 06/22/2018     STUDIES: NM Sentinel Node Inj-No Rpt (Breast)  Result Date: 05/22/2021 Sulfur Colloid was injected by the Nuclear Medicine Technologist for sentinel lymph node localization.   MM Breast Surgical Specimen  Result Date: 05/27/2021 CLINICAL DATA:  Status post radiofrequency device localization of the RIGHT breast. EXAM: SPECIMEN RADIOGRAPH OF THE RIGHT BREAST COMPARISON:  Previous exam(s). FINDINGS: Status post excision of the right breast. The radiofrequency device and coil and Heart shaped biopsy marker clips are present, completely intact, and were marked for pathology. The RF device is discrete from the tissue and within the specimen container. IMPRESSION: Specimen radiograph of the right breast. Electronically Signed   By: Nolon Nations M.D.   On: 05/27/2021 14:29  US Venous Img Lower Unilateral Right  Result Date: 05/24/2021 CLINICAL DATA:  R calf pain EXAM: RIGHT LOWER EXTREMITY VENOUS DOPPLER ULTRASOUND TECHNIQUE: Gray-scale sonography with graded compression,  as well as color Doppler and duplex ultrasound were performed to evaluate the lower extremity deep venous systems from the level of the common femoral vein and including the common femoral, femoral, profunda femoral, popliteal and calf veins including the posterior tibial, peroneal and gastrocnemius veins when visible. The superficial great saphenous vein was also interrogated. Spectral Doppler was utilized to evaluate flow at rest and with distal augmentation maneuvers in the common femoral, femoral and popliteal veins. COMPARISON:  None. FINDINGS: Contralateral Common Femoral Vein: Respiratory phasicity is normal and symmetric with the symptomatic side. No evidence of thrombus. Normal compressibility. Common Femoral Vein: No evidence of thrombus. Normal compressibility, respiratory phasicity and response to augmentation. Saphenofemoral Junction: No evidence of thrombus. Normal compressibility and flow on color Doppler imaging. Profunda Femoral Vein: No evidence of thrombus. Normal compressibility and flow on color Doppler imaging. Femoral Vein: No evidence of thrombus. Normal compressibility, respiratory phasicity and response to augmentation. Popliteal Vein: There is short segment nonocclusive thrombus seen 1 of 2 duplicated popliteal veins. The second popliteal vein is widely patent. Calf Veins: No evidence of thrombus. Normal compressibility and flow on color Doppler imaging. Superficial Great Saphenous Vein: No evidence of thrombus. Normal compressibility. Venous Reflux:  None. Other Findings:  None. IMPRESSION: There appears to be short-segment nonocclusive thrombus seen in 1 of 2 duplicated popliteal veins. The other popliteal vein is widely patent without thrombus. No findings of below-the-knee DVT in the calf veins. Electronically Signed   By: Albin Felling M.D.   On: 05/24/2021 18:46   MM RT RADIO FREQUENCY TAG LOC MAMMO GUIDE  Result Date: 05/14/2021 CLINICAL DATA:  Preoperative radiofrequency  device localization of right breast cancer. EXAM: MAMMOGRAPHIC GUIDED RADIOFREQUENCY DEVICE LOCALIZATION OF THE RIGHT BREAST COMPARISON:  Previous exam(s) FINDINGS: Patient presents for radiofrequency device localization prior to right breast lumpectomy. I met with the patient and we discussed the procedure of radiofrequency device localization including benefits and alternatives. We discussed the high likelihood of a successful procedure. We discussed the risks of the procedure including infection, bleeding, tissue injury and further surgery. Informed, written consent was given. The usual time-out protocol was performed immediately prior to the procedure. Using mammographic guidance, sterile technique, 1% lidocaine as local anesthesia, a radiofrequency tag was used to localize coil shaped marker using a superior approach. The follow-up mammogram images confirm that the RF device is in the expected location and are marked for Dr. Lysle Pearl. Follow-up  survey of the patient confirms the presence of the RF device. The patient tolerated the procedure well and was [facility]. IMPRESSION: Radiofrequency device localization of the RIGHT breast. No apparent complications. Electronically Signed   By: Fidela Salisbury M.D.   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  She will require reexcision and because of her history of whole breast radiation greater than 15 years ago, patient is leaning towards mastectomy.  Will send Oncotype for completeness.  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  No intervention  is needed at this time.  Return to clinic 1 to 2 weeks after her reexcision for further evaluation and treatment planning.   Genetic testing: Patient does not have any biologic children.  A referral was previously sent to genetics.  I spent a total of 30 minutes reviewing chart data, face-to-face evaluation with the patient, counseling and coordination of care as detailed above.    Patient expressed understanding and was in agreement with this plan. She also understands that She can call clinic at any time with any questions, concerns, or complaints.    Cancer Staging  Invasive ductal carcinoma of right breast [facility]) Staging form: Breast, AJCC 8th Edition - Pathologic stage from 06/06/2021: Stage IA (pT1b, pN0, cM0, G2, ER+, PR+, HER2-) - Signed by Lloyd Huger, MD on 06/06/2021 Stage prefix: Initial diagnosis Histologic grading system: 3 grade system  Lloyd Huger, MD   06/06/2021 10:11 AM

## 2021-06-03 ENCOUNTER — Other Ambulatory Visit: Payer: Self-pay | Admitting: Pathology

## 2021-06-03 LAB — SURGICAL PATHOLOGY

## 2021-06-05 ENCOUNTER — Encounter: Payer: Self-pay | Admitting: Oncology

## 2021-06-05 ENCOUNTER — Inpatient Hospital Stay: Payer: Medicare PPO | Attending: Oncology | Admitting: Oncology

## 2021-06-05 ENCOUNTER — Other Ambulatory Visit: Payer: Self-pay

## 2021-06-05 VITALS — BP 144/82 | HR 75 | Temp 97.0°F | Resp 17 | Wt 189.0 lb

## 2021-06-05 DIAGNOSIS — Z79899 Other long term (current) drug therapy: Secondary | ICD-10-CM | POA: Diagnosis not present

## 2021-06-05 DIAGNOSIS — Z7901 Long term (current) use of anticoagulants: Secondary | ICD-10-CM | POA: Diagnosis not present

## 2021-06-05 DIAGNOSIS — Z170 Estrogen receptor positive status [ER+]: Secondary | ICD-10-CM | POA: Diagnosis not present

## 2021-06-05 DIAGNOSIS — C50411 Malignant neoplasm of upper-outer quadrant of right female breast: Secondary | ICD-10-CM | POA: Insufficient documentation

## 2021-06-05 DIAGNOSIS — Z803 Family history of malignant neoplasm of breast: Secondary | ICD-10-CM | POA: Diagnosis not present

## 2021-06-05 DIAGNOSIS — Z79811 Long term (current) use of aromatase inhibitors: Secondary | ICD-10-CM | POA: Diagnosis not present

## 2021-06-05 DIAGNOSIS — C50911 Malignant neoplasm of unspecified site of right female breast: Secondary | ICD-10-CM

## 2021-06-05 DIAGNOSIS — Z923 Personal history of irradiation: Secondary | ICD-10-CM | POA: Insufficient documentation

## 2021-06-05 NOTE — Progress Notes (Signed)
Patient here for oncology follow-up appointment, no new concerns, patient states she is doing well since surgery.

## 2021-06-06 ENCOUNTER — Telehealth: Payer: Self-pay | Admitting: Emergency Medicine

## 2021-06-06 NOTE — Telephone Encounter (Signed)
Oncotype request sent and necessary items faxed.

## 2021-06-13 ENCOUNTER — Encounter: Payer: Self-pay | Admitting: *Deleted

## 2021-06-13 NOTE — Progress Notes (Signed)
Patient called with surgery date of July 29, 2021.  Had questions about additional testing.  Reviewed Oncotype Dx.  Patient states she will need follow up appointment with Dr. Grayland Ormond.  Will notify his team.

## 2021-06-19 ENCOUNTER — Encounter: Payer: Self-pay | Admitting: Oncology

## 2021-07-03 ENCOUNTER — Encounter (INDEPENDENT_AMBULATORY_CARE_PROVIDER_SITE_OTHER): Payer: Medicare Other | Admitting: Vascular Surgery

## 2021-07-14 ENCOUNTER — Ambulatory Visit: Payer: Self-pay | Admitting: Surgery

## 2021-07-14 ENCOUNTER — Other Ambulatory Visit (HOSPITAL_COMMUNITY): Payer: Self-pay | Admitting: Surgery

## 2021-07-14 ENCOUNTER — Other Ambulatory Visit: Payer: Self-pay | Admitting: Surgery

## 2021-07-14 DIAGNOSIS — I824Z1 Acute embolism and thrombosis of unspecified deep veins of right distal lower extremity: Secondary | ICD-10-CM

## 2021-07-14 NOTE — H&P (Signed)
Subjective: °  °CC: Malignant neoplasm of upper-inner quadrant of right breast in female, estrogen receptor positive (CMS-HCC) [C50.211, Z17.0] POSTOP °  °HPI: ° Regina Blanchard is a 71 y.o. female who is here for followup from above.  Here to discuss options moving forward with recent path showing positive margins for DCIS. °  °Current Medications: has a current medication list which includes the following prescription(s): alprazolam, amoxicillin, apixaban, baclofen, cholecalciferol, herbal supplement, ibuprofen, multivitamin, olmesartan-hydrochlorothiazide, tizanidine, aspirin, and hydrochlorothiazide. °  °Allergies:  °No Known Allergies °  °ROS: °General: Denies weight loss, weight gain, fatigue, fevers, chills, and night sweats. °Heart: Denies chest pain, palpitations, racing heart, irregular heartbeat, leg pain or swelling, and decreased activity tolerance. °Respiratory: Denies breathing difficulty, shortness of breath, wheezing, cough, and sputum. °GI: Denies change in appetite, heartburn, nausea, vomiting, constipation, diarrhea, and blood in stool. °GU: Denies difficulty urinating, pain with urinating, urgency, frequency, blood in urine °  °  °Objective: °  °BP (!) 142/85    Pulse 91    Ht 172.7 cm (5' 8")    Wt 85.7 kg (189 lb)    BMI 28.74 kg/m²  °  °  °LABS:  °SURGICAL PATHOLOGY  SURGICAL PATHOLOGY  °CASE: [id number]  °PATIENT: Regina Blanchard  °Surgical Pathology Report  ° ° ° ° °Specimen Submitted:  °A. Breast, right  °B. Breast, right, anterior margin  °C. Sentinel node 1, right axilla  ° °Clinical History: Malignant neoplasm of upper inner quadrant of right  °breast in female, estrogen receptor positive CMS-HCC C50.211, Z17.0  ° ° ° ° ° °DIAGNOSIS:  °A. BREAST, RIGHT; LUMPECTOMY:  °- INVASIVE MAMMARY CARCINOMA.  °- DUCTAL CARCINOMA IN SITU (DCIS), FOCALLY INVOLVING SCLEROSING  °ADENOSIS.  °- SEE CANCER SUMMARY BELOW.  °- FAT NECROSIS WITH PRIOR BIOPSY SITE CHANGE AND HEART CLIP.  °- AREA OF STROMAL  CALCIFICATION MEASURING 3 MM.  °- LOBULAR NEOPLASIA (ATYPICAL LOBULAR HYPERPLASIA).  °- SEPARATE BIOPSY SITE CHANGE WITH COIL CLIP.  °- RFID TAG IN PLACE.  ° °B.  BREAST, RIGHT ANTERIOR MARGIN; EXCISION:  °- DUCTAL CARCINOMA IN SITU (DCIS).  °- DCIS INVOLVES THE NEW ANTERIOR MARGIN.  °- SEE CANCER SUMMARY BELOW.  ° °C.  SENTINEL LYMPH NODE 1, RIGHT AXILLA; EXCISION:  °- ONE LYMPH NODE NEGATIVE FOR MALIGNANCY (0/1).  °- THERMAL ARTIFACT.  ° °CANCER CASE SUMMARY: INVASIVE CARCINOMA OF THE BREAST  °Standard(s): AJCC-UICC 8  ° °SPECIMEN  °Procedure: Lumpectomy  °Specimen Laterality: Right  ° °TUMOR  °Histologic Type: Invasive carcinoma of no special type  °Histologic Grade (Nottingham Histologic Score)  °                     Glandular (Acinar)/Tubular Differentiation: 3  °                     Nuclear Pleomorphism: 2  °                     Mitotic Rate: 1  °                     Overall Grade: 2  °Tumor Size: 9 mm  °Tumor Focality: Single focus of invasive carcinoma  °Tumor Extent: Not applicable (skin, nipple, and skeletal muscle are  °absent)  °Ductal Carcinoma In Situ (DCIS): Present  °Lymphatic and/or Vascular Invasion: Not identified  °Treatment Effect in the Breast: No known presurgical therapy  ° °MARGINS  °Margin Status for Invasive Carcinoma:   All margins negative for invasive  carcinoma                       Distance from closest margin: 7 mm                       Specify closest margin: Posterior   Margin Status for DCIS: Margin(s) Involved by DCIS:                       Anterior, unifocal                       Posterior, multifocal                       Inferior, unifocal               See comment below.   REGIONAL LYMPH NODES  Regional Lymph Node Status: All regional lymph nodes negative for tumor                       Total Number of Lymph Nodes Examined (sentinel and  non-sentinel): 1                       Number of Sentinel Nodes Examined: 1   DISTANT METASTASIS  Distant Site(s) Involved,  if applicable: Not applicable   PATHOLOGIC STAGE CLASSIFICATION (pTNM, AJCC 8th Edition):  Modified Classification: Not applicable  pT Category: pT Ib  T Suffix: Not applicable  Regional Lymph Nodes Modifier: (sn)  pN Category: pN 0  pM Category: Not applicable   SPECIAL STUDIES  Breast Biomarker Testing Performed on Previous Biopsy: [id number] block  A1  Estrogen Receptor (ER) Status: Positive  Progesterone Receptor (PgR) Status: Positive  HER2 (by immunohistochemistry): Negative   (v4.8.0.0)   Comment:  In the lumpectomy specimen (part A), DCIS involves the anterior,  posterior, and inferior margins and is less than 1 mm to the medial  margin. In the re-excised anterior margin (part B), DCIS is present at  the new anterior margin.  Calponin and p63 myoepithelial stains were performed on parts A and B in  areas in which it was difficult to determine on HE sections between  DCIS involving sclerosing adenosis and invasive carcinoma.   IHC slides were prepared by Launa Grill, [place]. All controls stained  appropriately.   This test was developed and its performance characteristics determined  by [facility]. It has not been cleared or approved by the [place] Food and Drug  Administration. The FDA does not require this test to go through  premarket FDA review. This test is used for clinical purposes. It should  not be regarded as investigational or for research. This laboratory is  certified under the Clinical Laboratory Improvement Amendments (CLIA) as  qualified to perform high complexity clinical laboratory testing.    GROSS DESCRIPTION:  Intraoperative Consultation:      Labeled: Right breast mass      Received: Fresh      Specimen: Breast lumpectomy      Pathologic evaluation performed: Gross margin evaluation      Diagnosis: IOC: Clips x2 and RF tag present.  Lesion approximately 2  mm from anterior.      Communicated to: Called to Dr. Lysle Pearl at 1:32 PM on 05/22/2021 Allena Napoleon M.D.      Tissue submitted: None  A. Labeled: Right breast mass  Received: Fresh  Specimen radiograph image(s) available for review  Radiographic findings: 2 clips and an RF ID tag are present.  Time in fixative: Collected at 1:19 PM on 05/22/2021 and placed in  formalin at 1:32 PM on 05/22/2021  Cold ischemic time: Less than 30 minutes  Total fixation time: Approximately 27.75 hours  Type of procedure: Breast lumpectomy  Location / laterality of specimen: Right breast  Orientation of specimen: The specimen is received inked and is oriented  with clear plastic clips labeled as follows: A, P, S, I, M, and L.  Inking:  Anterior = green  Inferior = blue  Lateral = orange  Medial = yellow  Posterior = black  Superior = red  Size of specimen: 4.2 (medial to lateral) x 2.6 (superior to inferior) x  2.4 (anterior to posterior) cm  Skin: None grossly appreciated.   Biopsy site: There are 2 biopsy sites.  The more superior biopsy site  contains a coil shaped clip and the more inferior biopsy site contains a  heart-shaped clip.  Number of discrete masses: 1  Size of mass(es): 1.2 x 0.7 x 0.5 cm  Description of mass(es): The mass is tan-white, firm, and ill-defined  with central areas of hemorrhage.  Distance between masses/clips: Coil shaped clip is located within the  mass.  The heart-shaped clip is a 1.1 cm lateral and inferior to the  mass.  Margins: Anterior = 0.2 cm, medial = 0.5 cm, posterior = 0.6 cm, lateral  = 1.4 cm, superior = 0.6 cm, inferior = 0.5 cm  Description of remainder of tissue: The remainder of the specimen is  comprised of yellow lobulated adipose tissue admixed with tan-white firm  fibrous tissue.  At the posterior margin there is a 0.3 x 0.2 x 0.2 cm  well-circumscribed calcified nodule.  A distinct mass is not identified  near the heart-shaped clip.  The fat to fibrous tissue ratio is 80:20.   Block summary (the specimen is submitted entirely):  1  - 2 - posterior superior margin, perpendicularly sectioned       2 - calcified nodule at posterior margin  3 - 4 - representative grossly normal breast tissue superiorly adjacent  to mass with closest approach to superior margins  5 - 8 - mass, submitted entirely       5 - 6 - 1 section, bisected            5 - clip site with closest approach to anterior, medial, and  posterior margins            6 - closest approach to lateral margins       7 - 8 - 1 section, bisected            7 - additional mass with closest anterior, medial, and  posterior margins            8 - additional closest lateral margins  9 - 10 - representative grossly normal breast tissue and fibrous tissue  between mass and heart-shaped clip site       10 - closest inferior margin to mass  11 - 12 - heart-shaped clip site, bisected and submitted entirely  13 - 15 - inferior margins, perpendicularly sectioned   B. Labeled: Anterior margin of right breast  Received: Fresh  Specimen radiograph image(s) available for review  Radiographic findings: None provided  Time in fixative: Collected at 1:38 PM on 05/22/2021 and placed  in  °formalin at 3:03 PM on 05/22/2021  °Cold ischemic time: Over 1 hour  °Total fixation time: Approximately 26.25 hours  °Type of procedure: Breast lumpectomy  °Location / laterality of specimen: Right breast, anterior margin  °Orientation of specimen: The specimen is received inked.  °Inking:  °Anterior = green  °Inferior = blue  °Lateral = orange  °Medial = yellow  °Posterior = red  °Superior = Black  °Size of specimen: 5.5 x 3.5 x 1.3 cm  °Skin: None grossly appreciated.  ° °Biopsy site: None grossly appreciated.  ° °Number of discrete masses: None grossly appreciated.  °Description of tissue: The specimen is received oriented as a C shaped  °portion of tissue.  Sectioning reveals yellow lobulated adipose tissue  °with scattered areas of hemorrhage and fibrous tissue.  The fat to  °fibrous tissue ratio is  95:5. No distinct masses or lesions are grossly  °identified.  ° °Block summary (specimen submitted entirely):  °1 - lateral margin, perpendicularly sectioned  °2 - 10 - central sections, submitted entirely and sequentially  °11 - 12 - opposing lateral margins, perpendicularly sectioned  ° °C. Labeled: Sentinel node #1 from right axilla  °Received: Fresh  °Collection time: 1:45 PM on 05/22/2021  °Placed into formalin time: 3:03 PM on 05/22/2021  °Tissue fragment(s): 1  °Size: 1.4 x 1.3 x 0.9 cm  °Description: Received is a lymph node candidate with a scant amount of  °surrounding adipose tissue.  The lymph node candidate is serially  °sectioned.  °Entirely submitted in 1 cassette.  ° °RB 05/23/2021  ° °Final Diagnosis performed by Tara Rubinas, MD.   Electronically signed  °06/03/2021 10:04:36AM  °The electronic signature indicates that the named Attending Pathologist  °has evaluated the specimen  °Technical component performed at [facility], [address], [place],  °[place] [address] Lab: [phone] Dir: Sanjai Nagendra, MD, MMM  ° Professional component performed at [facility], [facility], [address], [place], [place] [address] Lab: [phone]  °Dir: Heath M. Jones, MD °  °  °  °RADS: °N/A °  °Assessment: °  °   °Malignant neoplasm of upper-inner quadrant of right breast in female, estrogen receptor positive (CMS-HCC) [C50.211, Z17.0] °S/p lumpectomy, with positive margins as noted above. 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  By not waiting until regimen is completed, hope is this will decrease the small chance of DCIS spreading beyond the breast.  This was after extensive discussion with pt and husband of all r/b/a involved with this case specifically. °  °Will arrange US to  check status of DVT couple weeks prior to scheduled date, and also request lovenox bridge perioperatively  ° ° °

## 2021-07-14 NOTE — H&P (View-Only) (Signed)
Subjective: °  °CC: Malignant neoplasm of upper-inner quadrant of right breast in female, estrogen receptor positive (CMS-HCC) [C50.211, Z17.0] POSTOP °  °HPI: ° Regina Blanchard is a 70 y.o. female who is here for followup from above.  Here to discuss options moving forward with recent path showing positive margins for DCIS. °  °Current Medications: has a current medication list which includes the following prescription(s): alprazolam, amoxicillin, apixaban, baclofen, cholecalciferol, herbal supplement, ibuprofen, multivitamin, olmesartan-hydrochlorothiazide, tizanidine, aspirin, and hydrochlorothiazide. °  °Allergies:  °No Known Allergies °  °ROS: °General: Denies weight loss, weight gain, fatigue, fevers, chills, and night sweats. °Heart: Denies chest pain, palpitations, racing heart, irregular heartbeat, leg pain or swelling, and decreased activity tolerance. °Respiratory: Denies breathing difficulty, shortness of breath, wheezing, cough, and sputum. °GI: Denies change in appetite, heartburn, nausea, vomiting, constipation, diarrhea, and blood in stool. °GU: Denies difficulty urinating, pain with urinating, urgency, frequency, blood in urine °  °  °Objective: °  °BP (!) 142/85    Pulse 91    Ht 172.7 cm (5' 8")    Wt 85.7 kg (189 lb)    BMI 28.74 kg/m²  °  °  °LABS:  °SURGICAL PATHOLOGY  SURGICAL PATHOLOGY  °CASE: [id number]  °PATIENT: Shatika Coull  °Surgical Pathology Report  ° ° ° ° °Specimen Submitted:  °A. Breast, right  °B. Breast, right, anterior margin  °C. Sentinel node 1, right axilla  ° °Clinical History: Malignant neoplasm of upper inner quadrant of right  °breast in female, estrogen receptor positive CMS-HCC C50.211, Z17.0  ° ° ° ° ° °DIAGNOSIS:  °A. BREAST, RIGHT; LUMPECTOMY:  °- INVASIVE MAMMARY CARCINOMA.  °- DUCTAL CARCINOMA IN SITU (DCIS), FOCALLY INVOLVING SCLEROSING  °ADENOSIS.  °- SEE CANCER SUMMARY BELOW.  °- FAT NECROSIS WITH PRIOR BIOPSY SITE CHANGE AND HEART CLIP.  °- AREA OF STROMAL  CALCIFICATION MEASURING 3 MM.  °- LOBULAR NEOPLASIA (ATYPICAL LOBULAR HYPERPLASIA).  °- SEPARATE BIOPSY SITE CHANGE WITH COIL CLIP.  °- RFID TAG IN PLACE.  ° °B.  BREAST, RIGHT ANTERIOR MARGIN; EXCISION:  °- DUCTAL CARCINOMA IN SITU (DCIS).  °- DCIS INVOLVES THE NEW ANTERIOR MARGIN.  °- SEE CANCER SUMMARY BELOW.  ° °C.  SENTINEL LYMPH NODE 1, RIGHT AXILLA; EXCISION:  °- ONE LYMPH NODE NEGATIVE FOR MALIGNANCY (0/1).  °- THERMAL ARTIFACT.  ° °CANCER CASE SUMMARY: INVASIVE CARCINOMA OF THE BREAST  °Standard(s): AJCC-UICC 8  ° °SPECIMEN  °Procedure: Lumpectomy  °Specimen Laterality: Right  ° °TUMOR  °Histologic Type: Invasive carcinoma of no special type  °Histologic Grade (Nottingham Histologic Score)  °                     Glandular (Acinar)/Tubular Differentiation: 3  °                     Nuclear Pleomorphism: 2  °                     Mitotic Rate: 1  °                     Overall Grade: 2  °Tumor Size: 9 mm  °Tumor Focality: Single focus of invasive carcinoma  °Tumor Extent: Not applicable (skin, nipple, and skeletal muscle are  °absent)  °Ductal Carcinoma In Situ (DCIS): Present  °Lymphatic and/or Vascular Invasion: Not identified  °Treatment Effect in the Breast: No known presurgical therapy  ° °MARGINS  °Margin Status for Invasive Carcinoma:   All margins negative for invasive  carcinoma                       Distance from closest margin: 7 mm                       Specify closest margin: Posterior   Margin Status for DCIS: Margin(s) Involved by DCIS:                       Anterior, unifocal                       Posterior, multifocal                       Inferior, unifocal               See comment below.   REGIONAL LYMPH NODES  Regional Lymph Node Status: All regional lymph nodes negative for tumor                       Total Number of Lymph Nodes Examined (sentinel and  non-sentinel): 1                       Number of Sentinel Nodes Examined: 1   DISTANT METASTASIS  Distant Site(s) Involved,  if applicable: Not applicable   PATHOLOGIC STAGE CLASSIFICATION (pTNM, AJCC 8th Edition):  Modified Classification: Not applicable  pT Category: pT Ib  T Suffix: Not applicable  Regional Lymph Nodes Modifier: (sn)  pN Category: pN 0  pM Category: Not applicable   SPECIAL STUDIES  Breast Biomarker Testing Performed on Previous Biopsy: [id number] block  A1  Estrogen Receptor (ER) Status: Positive  Progesterone Receptor (PgR) Status: Positive  HER2 (by immunohistochemistry): Negative   (v4.8.0.0)   Comment:  In the lumpectomy specimen (part A), DCIS involves the anterior,  posterior, and inferior margins and is less than 1 mm to the medial  margin. In the re-excised anterior margin (part B), DCIS is present at  the new anterior margin.  Calponin and p63 myoepithelial stains were performed on parts A and B in  areas in which it was difficult to determine on HE sections between  DCIS involving sclerosing adenosis and invasive carcinoma.   IHC slides were prepared by Launa Grill, [place]. All controls stained  appropriately.   This test was developed and its performance characteristics determined  by [facility]. It has not been cleared or approved by the [place] Food and Drug  Administration. The FDA does not require this test to go through  premarket FDA review. This test is used for clinical purposes. It should  not be regarded as investigational or for research. This laboratory is  certified under the Clinical Laboratory Improvement Amendments (CLIA) as  qualified to perform high complexity clinical laboratory testing.    GROSS DESCRIPTION:  Intraoperative Consultation:      Labeled: Right breast mass      Received: Fresh      Specimen: Breast lumpectomy      Pathologic evaluation performed: Gross margin evaluation      Diagnosis: IOC: Clips x2 and RF tag present.  Lesion approximately 2  mm from anterior.      Communicated to: Called to Dr. Lysle Pearl at 1:32 PM on 05/22/2021 Allena Napoleon M.D.      Tissue submitted: None  A. Labeled: Right breast mass  Received: Fresh  Specimen radiograph image(s) available for review  Radiographic findings: 2 clips and an RF ID tag are present.  Time in fixative: Collected at 1:19 PM on 05/22/2021 and placed in  formalin at 1:32 PM on 05/22/2021  Cold ischemic time: Less than 30 minutes  Total fixation time: Approximately 27.75 hours  Type of procedure: Breast lumpectomy  Location / laterality of specimen: Right breast  Orientation of specimen: The specimen is received inked and is oriented  with clear plastic clips labeled as follows: A, P, S, I, M, and L.  Inking:  Anterior = green  Inferior = blue  Lateral = orange  Medial = yellow  Posterior = black  Superior = red  Size of specimen: 4.2 (medial to lateral) x 2.6 (superior to inferior) x  2.4 (anterior to posterior) cm  Skin: None grossly appreciated.   Biopsy site: There are 2 biopsy sites.  The more superior biopsy site  contains a coil shaped clip and the more inferior biopsy site contains a  heart-shaped clip.  Number of discrete masses: 1  Size of mass(es): 1.2 x 0.7 x 0.5 cm  Description of mass(es): The mass is tan-white, firm, and ill-defined  with central areas of hemorrhage.  Distance between masses/clips: Coil shaped clip is located within the  mass.  The heart-shaped clip is a 1.1 cm lateral and inferior to the  mass.  Margins: Anterior = 0.2 cm, medial = 0.5 cm, posterior = 0.6 cm, lateral  = 1.4 cm, superior = 0.6 cm, inferior = 0.5 cm  Description of remainder of tissue: The remainder of the specimen is  comprised of yellow lobulated adipose tissue admixed with tan-white firm  fibrous tissue.  At the posterior margin there is a 0.3 x 0.2 x 0.2 cm  well-circumscribed calcified nodule.  A distinct mass is not identified  near the heart-shaped clip.  The fat to fibrous tissue ratio is 80:20.   Block summary (the specimen is submitted entirely):  1  - 2 - posterior superior margin, perpendicularly sectioned       2 - calcified nodule at posterior margin  3 - 4 - representative grossly normal breast tissue superiorly adjacent  to mass with closest approach to superior margins  5 - 8 - mass, submitted entirely       5 - 6 - 1 section, bisected            5 - clip site with closest approach to anterior, medial, and  posterior margins            6 - closest approach to lateral margins       7 - 8 - 1 section, bisected            7 - additional mass with closest anterior, medial, and  posterior margins            8 - additional closest lateral margins  9 - 10 - representative grossly normal breast tissue and fibrous tissue  between mass and heart-shaped clip site       10 - closest inferior margin to mass  11 - 12 - heart-shaped clip site, bisected and submitted entirely  13 - 15 - inferior margins, perpendicularly sectioned   B. Labeled: Anterior margin of right breast  Received: Fresh  Specimen radiograph image(s) available for review  Radiographic findings: None provided  Time in fixative: Collected at 1:38 PM on 05/22/2021 and placed  in  formalin at 3:03 PM on 05/22/2021  Cold ischemic time: Over 1 hour  Total fixation time: Approximately 26.25 hours  Type of procedure: Breast lumpectomy  Location / laterality of specimen: Right breast, anterior margin  Orientation of specimen: The specimen is received inked.  Inking:  Anterior = green  Inferior = blue  Lateral = orange  Medial = yellow  Posterior = red  Superior = Black  Size of specimen: 5.5 x 3.5 x 1.3 cm  Skin: None grossly appreciated.   Biopsy site: None grossly appreciated.   Number of discrete masses: None grossly appreciated.  Description of tissue: The specimen is received oriented as a C shaped  portion of tissue.  Sectioning reveals yellow lobulated adipose tissue  with scattered areas of hemorrhage and fibrous tissue.  The fat to  fibrous tissue ratio is  95:5. No distinct masses or lesions are grossly  identified.   Block summary (specimen submitted entirely):  1 - lateral margin, perpendicularly sectioned  2 - 10 - central sections, submitted entirely and sequentially  11 - 12 - opposing lateral margins, perpendicularly sectioned   C. Labeled: Sentinel node #1 from right axilla  Received: Fresh  Collection time: 1:45 PM on 05/22/2021  Placed into formalin time: 3:03 PM on 05/22/2021  Tissue fragment(s): 1  Size: 1.4 x 1.3 x 0.9 cm  Description: Received is a lymph node candidate with a scant amount of  surrounding adipose tissue.  The lymph node candidate is serially  sectioned.  Entirely submitted in 1 cassette.   RB 05/23/2021   Final Diagnosis performed by Quay Burow, MD.   Electronically signed  06/03/2021 10:04:36AM  The electronic signature indicates that the named Attending Pathologist  has evaluated the specimen  Technical component performed at [facility], [address], [place],  [place] [address] Lab: [phone] Dir: Rush Farmer, MD, MMM   Professional component performed at [facility], [facility], [place], [place], [place] [address] Lab: [phone]  Dir: Kathi Simpers, MD       RADS: N/A   Assessment:      Malignant neoplasm of upper-inner quadrant of right breast in female, estrogen receptor positive (CMS-HCC) [C50.211, Z17.0] S/p lumpectomy, with positive margins as noted above.   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  By not waiting until regimen is completed, hope is this will decrease the small chance of DCIS spreading beyond the breast.  This was after extensive discussion with pt and husband of all r/b/a involved with this case specifically.   Will arrange [place] to  check status of DVT couple weeks prior to scheduled date, and also request lovenox bridge perioperatively

## 2021-07-21 ENCOUNTER — Encounter
Admission: RE | Admit: 2021-07-21 | Discharge: 2021-07-21 | Disposition: A | Discharge status: Home / Self Care | Payer: Medicare PPO | Source: Ambulatory Visit | Attending: Surgery | Admitting: Surgery

## 2021-07-21 ENCOUNTER — Other Ambulatory Visit: Payer: Self-pay

## 2021-07-21 VITALS — Ht 68.0 in | Wt 188.0 lb

## 2021-07-21 DIAGNOSIS — E782 Mixed hyperlipidemia: Secondary | ICD-10-CM

## 2021-07-21 DIAGNOSIS — I10 Essential (primary) hypertension: Secondary | ICD-10-CM

## 2021-07-21 DIAGNOSIS — Z01812 Encounter for preprocedural laboratory examination: Secondary | ICD-10-CM

## 2021-07-21 HISTORY — DX: Essential (primary) hypertension: I10

## 2021-07-21 NOTE — Patient Instructions (Addendum)
Your procedure is scheduled on: Tuesday, March 7 Report to the Registration Desk on the 1st floor of the [facility]. To find out your arrival time, please call [phone] between 1PM - 3PM on: Monday, March 6  REMEMBER: Instructions that are not followed completely may result in serious medical risk, up to and including death; or upon the discretion of your surgeon and anesthesiologist your surgery may need to be rescheduled.  Do not eat food after midnight the night before surgery.  No gum chewing, lozengers or hard candies.  You may however, drink CLEAR liquids up to 2 hours before you are scheduled to arrive for your surgery. Do not drink anything within 2 hours of your scheduled arrival time.  Clear liquids include: - water  - apple juice without pulp - gatorade (not RED colors) - black coffee or tea (Do NOT add milk or creamers to the coffee or tea) Do NOT drink anything that is not on this list.  DO NOT TAKE ANY MEDICATIONS THE MORNING OF SURGERY  Follow recommendations from PCP regarding stopping Eliquis (apixaban). Copied from Dr. Ammie Ferrier note: We will do Lovenox 60 mg twice daily 5 days preop and 5 days postop, stopping Eliquis 5 days pre and restarting on postop day 6.  One week prior to surgery: starting February 28 Stop Anti-inflammatories (NSAIDS) such as Advil, Aleve, Ibuprofen, Motrin, Naproxen, Naprosyn and Aspirin based products such as Excedrin, Goodys Powder, BC Powder. Stop ANY OVER THE COUNTER supplements until after surgery. Stop multivitamin, vitamin D You may however, continue to take Tylenol if needed for pain up until the day of surgery.  No Alcohol for 24 hours before or after surgery.  No Smoking including e-cigarettes for 24 hours prior to surgery.  No chewable tobacco products for at least 6 hours prior to surgery.  No nicotine patches on the day of surgery.  Do not use any "recreational" drugs for at least a week prior to your surgery.  Please  be advised that the combination of cocaine and anesthesia may have negative outcomes, up to and including death. If you test positive for cocaine, your surgery will be cancelled.  On the morning of surgery brush your teeth with toothpaste and water, you may rinse your mouth with mouthwash if you wish. Do not swallow any toothpaste or mouthwash.  Use CHG Soap as directed on instruction sheet.  Do not wear jewelry, make-up, hairpins, clips or nail polish.  Do not wear lotions, powders, or perfumes.   Do not shave body from the neck down 48 hours prior to surgery just in case you cut yourself which could leave a site for infection.  Also, freshly shaved skin may become irritated if using the CHG soap.  Contact lenses, hearing aids and dentures may not be worn into surgery.  Do not bring valuables to the hospital. [facility] is not responsible for any missing/lost belongings or valuables.   Notify your doctor if there is any change in your medical condition (cold, fever, infection).  Wear comfortable clothing (specific to your surgery type) to the hospital.  After surgery, you can help prevent lung complications by doing breathing exercises.  Take deep breaths and cough every 1-2 hours. Your doctor may order a device called an Incentive Spirometer to help you take deep breaths.  If you are being discharged the day of surgery, you will not be allowed to drive home. You will need a responsible adult (18 years or older) to drive you home  and stay with you that night.   If you are taking public transportation, you will need to have a responsible adult (18 years or older) with you. Please confirm with your physician that it is acceptable to use public transportation.   Please call the [facility] at [phone] if you have any questions about these instructions.  Surgery Visitation Policy:  Patients undergoing a surgery or procedure may have one family member or  support person with them as long as that person is not COVID-19 positive or experiencing its symptoms.  That person may remain in the waiting area during the procedure and may rotate out with other people.

## 2021-07-21 NOTE — Pre-Procedure Instructions (Signed)
Copy and pasted from care everywhere office visit of Dr. Emily Filbert dated 07/02/2021:   ASSESSMENT/PLAN   Breast CA- upcoming full mastectomy 3/7, low surgical risk Hypercoagulable state-will do Lovenox bridge. We will do Lovenox 60 mg twice daily 5 days preop and 5 days postop, stopping Eliquis 5 days pre and restarting on postop day 6  Dispo: No follow-ups on file.   Electronically signed by Yevonne Pax, MD at 07/02/2021 9:50 AM EST

## 2021-07-23 ENCOUNTER — Encounter
Admission: RE | Admit: 2021-07-23 | Discharge: 2021-07-23 | Disposition: A | Discharge status: Home / Self Care | Payer: Medicare PPO | Source: Ambulatory Visit | Attending: Surgery | Admitting: Surgery

## 2021-07-23 ENCOUNTER — Encounter: Payer: Self-pay | Admitting: Urgent Care

## 2021-07-23 ENCOUNTER — Other Ambulatory Visit: Payer: Self-pay

## 2021-07-23 DIAGNOSIS — E782 Mixed hyperlipidemia: Secondary | ICD-10-CM

## 2021-07-23 DIAGNOSIS — I10 Essential (primary) hypertension: Secondary | ICD-10-CM | POA: Insufficient documentation

## 2021-07-23 DIAGNOSIS — Z01812 Encounter for preprocedural laboratory examination: Secondary | ICD-10-CM | POA: Insufficient documentation

## 2021-07-23 DIAGNOSIS — E785 Hyperlipidemia, unspecified: Secondary | ICD-10-CM | POA: Diagnosis not present

## 2021-07-23 LAB — CBC
HCT: 37.8 % (ref 36.0–46.0)
Hemoglobin: 11.9 g/dL — ABNORMAL LOW (ref 12.0–15.0)
MCH: 28.5 pg (ref 26.0–34.0)
MCHC: 31.5 g/dL (ref 30.0–36.0)
MCV: 90.6 fL (ref 80.0–100.0)
Platelets: 232 10*3/uL (ref 150–400)
RBC: 4.17 MIL/uL (ref 3.87–5.11)
RDW: 14 % (ref 11.5–15.5)
WBC: 4.3 10*3/uL (ref 4.0–10.5)
nRBC: 0 % (ref 0.0–0.2)

## 2021-07-23 LAB — BASIC METABOLIC PANEL
Anion gap: 9 (ref 5–15)
BUN: 12 mg/dL (ref 8–23)
CO2: 30 mmol/L (ref 22–32)
Calcium: 9.6 mg/dL (ref 8.9–10.3)
Chloride: 104 mmol/L (ref 98–111)
Creatinine, Ser: 0.54 mg/dL (ref 0.44–1.00)
GFR, Estimated: >60 mL/min (ref >60)
Glucose, Bld: 98 mg/dL (ref 70–99)
Potassium: 4 mmol/L (ref 3.5–5.1)
Sodium: 143 mmol/L (ref 135–145)

## 2021-07-25 ENCOUNTER — Ambulatory Visit
Admission: RE | Admit: 2021-07-25 | Discharge: 2021-07-25 | Disposition: A | Discharge status: Home / Self Care | Payer: Medicare PPO | Source: Ambulatory Visit | Attending: Surgery | Admitting: Surgery

## 2021-07-25 ENCOUNTER — Other Ambulatory Visit: Payer: Self-pay

## 2021-07-25 DIAGNOSIS — I824Z1 Acute embolism and thrombosis of unspecified deep veins of right distal lower extremity: Secondary | ICD-10-CM | POA: Insufficient documentation

## 2021-07-29 ENCOUNTER — Ambulatory Visit: Payer: Medicare PPO | Admitting: Certified Registered"

## 2021-07-29 ENCOUNTER — Encounter
Admission: RE | Disposition: A | Discharge status: Home / Self Care | Payer: Self-pay | Source: Home / Self Care | Attending: Surgery

## 2021-07-29 ENCOUNTER — Other Ambulatory Visit: Payer: Self-pay

## 2021-07-29 ENCOUNTER — Observation Stay
Admission: RE | Admit: 2021-07-29 | Discharge: 2021-07-30 | Disposition: A | Discharge status: Home / Self Care | Payer: Medicare PPO | Attending: Surgery | Admitting: Surgery

## 2021-07-29 ENCOUNTER — Encounter: Payer: Self-pay | Admitting: Surgery

## 2021-07-29 DIAGNOSIS — Z791 Long term (current) use of non-steroidal anti-inflammatories (NSAID): Secondary | ICD-10-CM | POA: Insufficient documentation

## 2021-07-29 DIAGNOSIS — N6021 Fibroadenosis of right breast: Secondary | ICD-10-CM | POA: Diagnosis not present

## 2021-07-29 DIAGNOSIS — E559 Vitamin D deficiency, unspecified: Secondary | ICD-10-CM | POA: Diagnosis present

## 2021-07-29 DIAGNOSIS — Z792 Long term (current) use of antibiotics: Secondary | ICD-10-CM | POA: Diagnosis not present

## 2021-07-29 DIAGNOSIS — Z7901 Long term (current) use of anticoagulants: Secondary | ICD-10-CM | POA: Diagnosis not present

## 2021-07-29 DIAGNOSIS — Z853 Personal history of malignant neoplasm of breast: Secondary | ICD-10-CM | POA: Diagnosis not present

## 2021-07-29 DIAGNOSIS — Z79899 Other long term (current) drug therapy: Secondary | ICD-10-CM | POA: Diagnosis not present

## 2021-07-29 DIAGNOSIS — I10 Essential (primary) hypertension: Secondary | ICD-10-CM | POA: Insufficient documentation

## 2021-07-29 DIAGNOSIS — Z7982 Long term (current) use of aspirin: Secondary | ICD-10-CM | POA: Diagnosis not present

## 2021-07-29 DIAGNOSIS — M1990 Unspecified osteoarthritis, unspecified site: Secondary | ICD-10-CM | POA: Insufficient documentation

## 2021-07-29 DIAGNOSIS — N641 Fat necrosis of breast: Secondary | ICD-10-CM | POA: Diagnosis not present

## 2021-07-29 DIAGNOSIS — Z170 Estrogen receptor positive status [ER+]: Secondary | ICD-10-CM | POA: Insufficient documentation

## 2021-07-29 DIAGNOSIS — C50911 Malignant neoplasm of unspecified site of right female breast: Secondary | ICD-10-CM

## 2021-07-29 DIAGNOSIS — D0511 Intraductal carcinoma in situ of right breast: Secondary | ICD-10-CM | POA: Diagnosis present

## 2021-07-29 DIAGNOSIS — D0590 Unspecified type of carcinoma in situ of unspecified breast: Secondary | ICD-10-CM

## 2021-07-29 DIAGNOSIS — Z87898 Personal history of other specified conditions: Secondary | ICD-10-CM | POA: Diagnosis not present

## 2021-07-29 HISTORY — PX: TOTAL MASTECTOMY: SHX6129

## 2021-07-29 SURGERY — MASTECTOMY, SIMPLE
Anesthesia: General | Site: Breast | Laterality: Right

## 2021-07-29 MED ORDER — APREPITANT 40 MG PO CAPS: 40.0000 mg | ORAL_CAPSULE | Freq: Once | ORAL | Status: AC

## 2021-07-29 MED ORDER — OLMESARTAN MEDOXOMIL-HCTZ 20-12.5 MG PO TABS: 1.0000 | ORAL_TABLET | Freq: Every day | ORAL | Status: DC

## 2021-07-29 MED ORDER — IRBESARTAN 150 MG PO TABS
150.0000 mg | ORAL_TABLET | Freq: Every day | ORAL | Status: DC
  Filled 2021-07-29 (×2): qty 1

## 2021-07-29 MED ORDER — PROPOFOL 10 MG/ML IV BOLUS
INTRAVENOUS | Status: AC
  Filled 2021-07-29: qty 20

## 2021-07-29 MED ORDER — BUPIVACAINE LIPOSOME 1.3 % IJ SUSP
INTRAMUSCULAR | Status: AC
  Filled 2021-07-29: qty 20

## 2021-07-29 MED ORDER — ORAL CARE MOUTH RINSE: 15.0000 mL | Freq: Once | OROMUCOSAL | Status: AC

## 2021-07-29 MED ORDER — GABAPENTIN 300 MG PO CAPS
300.0000 mg | ORAL_CAPSULE | Freq: Two times a day (BID) | ORAL | Status: DC
  Administered 2021-07-29 – 2021-07-30 (×2): 300 mg via ORAL
  Filled 2021-07-29 (×2): qty 1

## 2021-07-29 MED ORDER — SENNOSIDES-DOCUSATE SODIUM 8.6-50 MG PO TABS: 1.0000 | ORAL_TABLET | Freq: Every evening | ORAL | Status: DC | PRN

## 2021-07-29 MED ORDER — BUPIVACAINE HCL (PF) 0.5 % IJ SOLN
INTRAMUSCULAR | Status: AC
  Filled 2021-07-29: qty 30

## 2021-07-29 MED ORDER — FENTANYL CITRATE (PF) 100 MCG/2ML IJ SOLN
25.0000 ug | INTRAMUSCULAR | Status: DC | PRN
  Administered 2021-07-29 (×3): 25 ug via INTRAVENOUS

## 2021-07-29 MED ORDER — ONDANSETRON HCL 4 MG/2ML IJ SOLN
INTRAMUSCULAR | Status: DC | PRN
  Administered 2021-07-29: 4 mg via INTRAVENOUS

## 2021-07-29 MED ORDER — DEXMEDETOMIDINE (PRECEDEX) IN NS 20 MCG/5ML (4 MCG/ML) IV SYRINGE
PREFILLED_SYRINGE | INTRAVENOUS | Status: DC | PRN
  Administered 2021-07-29: 8 ug via INTRAVENOUS

## 2021-07-29 MED ORDER — CHLORHEXIDINE GLUCONATE CLOTH 2 % EX PADS
6.0000 | MEDICATED_PAD | Freq: Once | CUTANEOUS | Status: AC
  Administered 2021-07-29: 6 via TOPICAL

## 2021-07-29 MED ORDER — CELECOXIB 200 MG PO CAPS: 200.0000 mg | ORAL_CAPSULE | ORAL | Status: AC

## 2021-07-29 MED ORDER — ONDANSETRON HCL 4 MG/2ML IJ SOLN: 4.0000 mg | Freq: Four times a day (QID) | INTRAMUSCULAR | Status: DC | PRN

## 2021-07-29 MED ORDER — CELECOXIB 200 MG PO CAPS
200.0000 mg | ORAL_CAPSULE | Freq: Two times a day (BID) | ORAL | Status: DC
  Administered 2021-07-29 – 2021-07-30 (×2): 200 mg via ORAL
  Filled 2021-07-29 (×3): qty 1

## 2021-07-29 MED ORDER — GABAPENTIN 300 MG PO CAPS: 300.0000 mg | ORAL_CAPSULE | ORAL | Status: AC

## 2021-07-29 MED ORDER — LIDOCAINE HCL (CARDIAC) PF 100 MG/5ML IV SOSY
PREFILLED_SYRINGE | INTRAVENOUS | Status: DC | PRN
  Administered 2021-07-29: 80 mg via INTRAVENOUS

## 2021-07-29 MED ORDER — CHLORHEXIDINE GLUCONATE 0.12 % MT SOLN: 15.0000 mL | Freq: Once | OROMUCOSAL | Status: AC

## 2021-07-29 MED ORDER — EPHEDRINE SULFATE (PRESSORS) 50 MG/ML IJ SOLN
INTRAMUSCULAR | Status: DC | PRN
  Administered 2021-07-29 (×3): 5 mg via INTRAVENOUS

## 2021-07-29 MED ORDER — MIDAZOLAM HCL 2 MG/2ML IJ SOLN
INTRAMUSCULAR | Status: AC
  Filled 2021-07-29: qty 2

## 2021-07-29 MED ORDER — HYDROCHLOROTHIAZIDE 12.5 MG PO TABS
12.5000 mg | ORAL_TABLET | Freq: Every day | ORAL | Status: DC
  Filled 2021-07-29 (×2): qty 1

## 2021-07-29 MED ORDER — ACETAMINOPHEN 500 MG PO TABS: 1000.0000 mg | ORAL_TABLET | ORAL | Status: AC

## 2021-07-29 MED ORDER — FENTANYL CITRATE (PF) 100 MCG/2ML IJ SOLN
INTRAMUSCULAR | Status: AC
  Administered 2021-07-29: 25 ug via INTRAVENOUS
  Filled 2021-07-29: qty 2

## 2021-07-29 MED ORDER — TIZANIDINE HCL 4 MG PO TABS
4.0000 mg | ORAL_TABLET | Freq: Two times a day (BID) | ORAL | Status: DC | PRN
  Filled 2021-07-29: qty 1

## 2021-07-29 MED ORDER — MIDAZOLAM HCL 2 MG/2ML IJ SOLN
INTRAMUSCULAR | Status: DC | PRN
  Administered 2021-07-29: 2 mg via INTRAVENOUS

## 2021-07-29 MED ORDER — ENOXAPARIN SODIUM 80 MG/0.8ML IJ SOSY
80.0000 mg | PREFILLED_SYRINGE | Freq: Two times a day (BID) | INTRAMUSCULAR | Status: DC
  Administered 2021-07-30: 80 mg via SUBCUTANEOUS
  Filled 2021-07-29 (×2): qty 0.8

## 2021-07-29 MED ORDER — DEXMEDETOMIDINE (PRECEDEX) IN NS 20 MCG/5ML (4 MCG/ML) IV SYRINGE
PREFILLED_SYRINGE | INTRAVENOUS | Status: AC
  Filled 2021-07-29: qty 5

## 2021-07-29 MED ORDER — ONDANSETRON HCL 4 MG/2ML IJ SOLN: 4.0000 mg | Freq: Once | INTRAMUSCULAR | Status: DC | PRN

## 2021-07-29 MED ORDER — BUPIVACAINE-EPINEPHRINE (PF) 0.5% -1:200000 IJ SOLN
INTRAMUSCULAR | Status: DC | PRN
  Administered 2021-07-29: 20 mL via PERINEURAL

## 2021-07-29 MED ORDER — CEFAZOLIN SODIUM-DEXTROSE 2-4 GM/100ML-% IV SOLN
INTRAVENOUS | Status: AC
  Filled 2021-07-29: qty 100

## 2021-07-29 MED ORDER — DEXAMETHASONE SODIUM PHOSPHATE 10 MG/ML IJ SOLN
INTRAMUSCULAR | Status: AC
  Filled 2021-07-29: qty 1

## 2021-07-29 MED ORDER — FAMOTIDINE 20 MG PO TABS
ORAL_TABLET | ORAL | Status: AC
  Administered 2021-07-29: 20 mg via ORAL
  Filled 2021-07-29: qty 1

## 2021-07-29 MED ORDER — ACETAMINOPHEN 325 MG PO TABS
650.0000 mg | ORAL_TABLET | Freq: Four times a day (QID) | ORAL | Status: DC | PRN
  Administered 2021-07-29: 650 mg via ORAL
  Filled 2021-07-29: qty 2

## 2021-07-29 MED ORDER — FAMOTIDINE 20 MG PO TABS: 20.0000 mg | ORAL_TABLET | Freq: Once | ORAL | Status: AC

## 2021-07-29 MED ORDER — VITAMIN D 25 MCG (1000 UNIT) PO TABS
6000.0000 [IU] | ORAL_TABLET | Freq: Every day | ORAL | Status: DC
  Administered 2021-07-30: 6000 [IU] via ORAL
  Filled 2021-07-29 (×2): qty 6

## 2021-07-29 MED ORDER — LIDOCAINE HCL (PF) 2 % IJ SOLN
INTRAMUSCULAR | Status: AC
  Filled 2021-07-29: qty 5

## 2021-07-29 MED ORDER — FLUTICASONE PROPIONATE 50 MCG/ACT NA SUSP
1.0000 | Freq: Every day | NASAL | Status: DC
  Administered 2021-07-30: 1 via NASAL
  Filled 2021-07-29: qty 16

## 2021-07-29 MED ORDER — ENOXAPARIN SODIUM 60 MG/0.6ML IJ SOSY
60.0000 mg | PREFILLED_SYRINGE | Freq: Two times a day (BID) | INTRAMUSCULAR | Status: DC
Start: 2021-07-30 — End: 2021-07-29

## 2021-07-29 MED ORDER — FENTANYL CITRATE (PF) 100 MCG/2ML IJ SOLN
INTRAMUSCULAR | Status: AC
  Filled 2021-07-29: qty 2

## 2021-07-29 MED ORDER — HYDROMORPHONE HCL 1 MG/ML IJ SOLN: 0.5000 mg | INTRAMUSCULAR | Status: DC | PRN

## 2021-07-29 MED ORDER — FENTANYL CITRATE (PF) 100 MCG/2ML IJ SOLN
INTRAMUSCULAR | Status: DC | PRN
  Administered 2021-07-29 (×2): 25 ug via INTRAVENOUS
  Administered 2021-07-29: 50 ug via INTRAVENOUS

## 2021-07-29 MED ORDER — CHLORHEXIDINE GLUCONATE 0.12 % MT SOLN
OROMUCOSAL | Status: AC
  Administered 2021-07-29: 15 mL via OROMUCOSAL
  Filled 2021-07-29: qty 15

## 2021-07-29 MED ORDER — CEFAZOLIN SODIUM-DEXTROSE 2-4 GM/100ML-% IV SOLN
2.0000 g | INTRAVENOUS | Status: AC
  Administered 2021-07-29: 2 g via INTRAVENOUS

## 2021-07-29 MED ORDER — ONDANSETRON HCL 4 MG/2ML IJ SOLN
INTRAMUSCULAR | Status: AC
  Filled 2021-07-29: qty 2

## 2021-07-29 MED ORDER — DEXAMETHASONE SODIUM PHOSPHATE 10 MG/ML IJ SOLN
INTRAMUSCULAR | Status: DC | PRN
  Administered 2021-07-29: 10 mg via INTRAVENOUS

## 2021-07-29 MED ORDER — TRAMADOL HCL 50 MG PO TABS
50.0000 mg | ORAL_TABLET | Freq: Four times a day (QID) | ORAL | Status: DC | PRN
  Administered 2021-07-29: 50 mg via ORAL
  Filled 2021-07-29 (×2): qty 1

## 2021-07-29 MED ORDER — SODIUM CHLORIDE (PF) 0.9 % IJ SOLN
INTRAMUSCULAR | Status: AC
  Filled 2021-07-29: qty 50

## 2021-07-29 MED ORDER — CELECOXIB 200 MG PO CAPS
ORAL_CAPSULE | ORAL | Status: AC
Start: 2021-07-29 — End: 2021-07-29
  Administered 2021-07-29: 200 mg via ORAL
  Filled 2021-07-29: qty 1

## 2021-07-29 MED ORDER — LACTATED RINGERS IV SOLN: INTRAVENOUS | Status: DC

## 2021-07-29 MED ORDER — SEVOFLURANE IN SOLN
RESPIRATORY_TRACT | Status: AC
  Filled 2021-07-29: qty 250

## 2021-07-29 MED ORDER — ONDANSETRON 4 MG PO TBDP: 4.0000 mg | ORAL_TABLET | Freq: Four times a day (QID) | ORAL | Status: DC | PRN

## 2021-07-29 MED ORDER — PROPOFOL 10 MG/ML IV BOLUS
INTRAVENOUS | Status: DC | PRN
  Administered 2021-07-29: 150 mg via INTRAVENOUS

## 2021-07-29 MED ORDER — APREPITANT 40 MG PO CAPS
ORAL_CAPSULE | ORAL | Status: AC
  Administered 2021-07-29: 40 mg via ORAL
  Filled 2021-07-29: qty 1

## 2021-07-29 MED ORDER — OXYCODONE HCL 5 MG PO TABS: 5.0000 mg | ORAL_TABLET | ORAL | Status: DC | PRN

## 2021-07-29 MED ORDER — BACLOFEN 10 MG PO TABS: 10.0000 mg | ORAL_TABLET | Freq: Every day | ORAL | Status: DC | PRN

## 2021-07-29 MED ORDER — GABAPENTIN 300 MG PO CAPS
ORAL_CAPSULE | ORAL | Status: AC
  Administered 2021-07-29: 300 mg via ORAL
  Filled 2021-07-29: qty 1

## 2021-07-29 MED ORDER — GUAIFENESIN-DM 100-10 MG/5ML PO SYRP: 5.0000 mL | ORAL_SOLUTION | Freq: Four times a day (QID) | ORAL | Status: DC | PRN

## 2021-07-29 MED ORDER — EPHEDRINE 5 MG/ML INJ
INTRAVENOUS | Status: AC
  Filled 2021-07-29: qty 5

## 2021-07-29 MED ORDER — ACETAMINOPHEN 500 MG PO TABS
ORAL_TABLET | ORAL | Status: AC
  Administered 2021-07-29: 1000 mg via ORAL
  Filled 2021-07-29: qty 2

## 2021-07-29 MED ORDER — ALPRAZOLAM 0.25 MG PO TABS: 0.2500 mg | ORAL_TABLET | Freq: Every day | ORAL | Status: DC | PRN

## 2021-07-29 MED ORDER — BUPIVACAINE-EPINEPHRINE (PF) 0.5% -1:200000 IJ SOLN
INTRAMUSCULAR | Status: AC
  Filled 2021-07-29: qty 30

## 2021-07-29 SURGICAL SUPPLY — 59 items
APPLIER CLIP 11 MED OPEN (CLIP)
BINDER BREAST MEDIUM (GAUZE/BANDAGES/DRESSINGS) ×2 IMPLANT
BLADE SURG 15 STRL LF DISP TIS (BLADE) ×1 IMPLANT
BLADE SURG 15 STRL SS (BLADE) ×2
BULB RESERV EVAC DRAIN JP 100C (MISCELLANEOUS) IMPLANT
CHLORAPREP W/TINT 26 (MISCELLANEOUS) ×3 IMPLANT
CLIP APPLIE 11 MED OPEN (CLIP) IMPLANT
CNTNR SPEC 2.5X3XGRAD LEK (MISCELLANEOUS)
CONT SPEC 4OZ STER OR WHT (MISCELLANEOUS)
CONTAINER SPEC 2.5X3XGRAD LEK (MISCELLANEOUS) IMPLANT
DERMABOND ADVANCED (GAUZE/BANDAGES/DRESSINGS) ×2
DERMABOND ADVANCED .7 DNX12 (GAUZE/BANDAGES/DRESSINGS) ×1 IMPLANT
DEVICE DSSCT PLSMBLD 3.0S LGHT (MISCELLANEOUS) ×1 IMPLANT
DRAIN CHANNEL JP 15F RND 16 (MISCELLANEOUS) ×2 IMPLANT
DRAPE LAPAROTOMY TRNSV 106X77 (MISCELLANEOUS) ×3 IMPLANT
DRSG GAUZE FLUFF 36X18 (GAUZE/BANDAGES/DRESSINGS) ×3 IMPLANT
DRSG OPSITE POSTOP 4X10 (GAUZE/BANDAGES/DRESSINGS) ×2 IMPLANT
ELECT REM PT RETURN 9FT ADLT (ELECTROSURGICAL) ×3
ELECTRODE REM PT RTRN 9FT ADLT (ELECTROSURGICAL) ×1 IMPLANT
GAUZE 4X4 16PLY ~~LOC~~+RFID DBL (SPONGE) ×3 IMPLANT
GAUZE SPONGE 4X4 12PLY STRL (GAUZE/BANDAGES/DRESSINGS) IMPLANT
GLOVE SURG SYN 6.5 ES PF (GLOVE) ×3 IMPLANT
GLOVE SURG SYN 6.5 PF PI (GLOVE) ×1 IMPLANT
GLOVE SURG UNDER POLY LF SZ7 (GLOVE) ×3 IMPLANT
GOWN STRL REUS W/ TWL LRG LVL3 (GOWN DISPOSABLE) ×2 IMPLANT
GOWN STRL REUS W/TWL LRG LVL3 (GOWN DISPOSABLE) ×4
ILLUMINATOR WAVEGUIDE N/F (MISCELLANEOUS) ×2 IMPLANT
KIT MARKER MARGIN INK (KITS) IMPLANT
KIT TURNOVER KIT A (KITS) ×3 IMPLANT
LABEL OR SOLS (LABEL) ×3 IMPLANT
LIGHT WAVEGUIDE WIDE FLAT (MISCELLANEOUS) IMPLANT
MANIFOLD NEPTUNE II (INSTRUMENTS) ×3 IMPLANT
NEEDLE HYPO 22GX1.5 SAFETY (NEEDLE) ×6 IMPLANT
PACK BASIN MINOR ARMC (MISCELLANEOUS) ×3 IMPLANT
PLASMABLADE 3.0S W/LIGHT (MISCELLANEOUS) ×3
SLEVE PROBE SENORX GAMMA FIND (MISCELLANEOUS) ×3 IMPLANT
SPONGE DRAIN TRACH 4X4 STRL 2S (GAUZE/BANDAGES/DRESSINGS) ×4 IMPLANT
SPONGE T-LAP 18X18 ~~LOC~~+RFID (SPONGE) ×5 IMPLANT
STAPLER SKIN PROX 35W (STAPLE) ×2 IMPLANT
SUT ETHILON 3-0 FS-10 30 BLK (SUTURE)
SUT MNCRL 4-0 (SUTURE) ×2
SUT MNCRL 4-0 27XMFL (SUTURE) ×1
SUT SILK 2 0 (SUTURE) ×2
SUT SILK 2 0 SH (SUTURE) ×3 IMPLANT
SUT SILK 2-0 30XBRD TIE 12 (SUTURE) ×1 IMPLANT
SUT SILK 3 0 12 30 (SUTURE) ×3 IMPLANT
SUT VIC AB 2-0 SH 27 (SUTURE) ×2
SUT VIC AB 2-0 SH 27XBRD (SUTURE) ×1 IMPLANT
SUT VIC AB 3-0 SH 27 (SUTURE) ×8
SUT VIC AB 3-0 SH 27X BRD (SUTURE) ×4 IMPLANT
SUTURE EHLN 3-0 FS-10 30 BLK (SUTURE) IMPLANT
SUTURE MNCRL 4-0 27XMF (SUTURE) ×1 IMPLANT
SYR 10ML LL (SYRINGE) ×3 IMPLANT
SYR 20ML LL LF (SYRINGE) ×6 IMPLANT
SYR BULB IRRIG 60ML STRL (SYRINGE) ×3 IMPLANT
TUBING CONNECTING 10 (TUBING) ×2 IMPLANT
TUBING CONNECTING 10' (TUBING) ×1
WATER STERILE IRR 1000ML POUR (IV SOLUTION) ×3 IMPLANT
WATER STERILE IRR 500ML POUR (IV SOLUTION) ×3 IMPLANT

## 2021-07-29 NOTE — Anesthesia Postprocedure Evaluation (Signed)
Anesthesia Post Note ? ?Patient: ARLENY KRUGER ? ?Procedure(s) Performed: TOTAL MASTECTOMY (Right: Breast) ? ?Patient location during evaluation: PACU ?Anesthesia Type: General ?Level of consciousness: awake and oriented ?Pain management: satisfactory to patient ?Vital Signs Assessment: post-procedure vital signs reviewed and stable ?Respiratory status: spontaneous breathing and respiratory function stable ?Cardiovascular status: stable ?Anesthetic complications: no ? ? ?No notable events documented. ? ? ?Last Vitals:  ?Vitals:  ? 07/29/21 1250 07/29/21 1300  ?BP: 131/63 127/60  ?Pulse: 78 77  ?Resp: 12 10  ?Temp:    ?SpO2: (!) 88% 94%  ?  ?Last Pain:  ?Vitals:  ? 07/29/21 1240  ?TempSrc:   ?PainSc: 1   ? ? ?  ?  ?  ?  ?  ?  ? ?VAN STAVEREN,Sholonda Jobst ? ? ? ? ?

## 2021-07-29 NOTE — Op Note (Signed)
Preoperative diagnosis: right Breast DCIS. ? ?Postoperative diagnosis: same.  ? ?Procedure: RIGHT simple mastectomy.  ? ?Anesthesia: GETA ? ?Surgeon: Dr. Lysle Pearl ? ?Wound Classification: Clean ? ?Specimen: RIGHT Breast ? ?Complications: None ? ?Estimated Blood Loss: 25 mL ? ? ?Indications:  see H&P ? ?Operation performed with curative intent:Yes ? ?Tracer(s) used to identify sentinel nodes in the upfront surgery (non-neoadjuvant) setting (select all that apply):N/A ? ?Tracer(s) used to identify sentinel nodes in the neoadjuvant setting (select all that apply):N/A ? ?All nodes (colored or non-colored) present at the end of a dye-filled lymphatic channel were removed:N/A ? ?All significantly radioactive nodes were removed:N/A ? ?All palpable suspicious nodes were removed:N/A ? ?Biopsy-proven positive nodes marked with clips prior to chemotherapy were identified and removed:N/A ? ? ?Description of procedure: The patient was brought to the operating room and general anesthesia was induced. A time-out was completed verifying correct patient, procedure, site, positioning, and implant(s) and/or special equipment prior to beginning this procedure. The breast, chest wall, axilla, and upper arm and neck were prepped and draped in the usual sterile fashion.  ?A skin incision was made that encompassed the nipple-areola complex and the previous biopsy scar and passed in an oblique direction across the breast.   ? ?Skin flaps were raised in the avascular plane between subcutaneous tissue and breast tissue from the clavicle superiorly, the sternum medially, the anterior rectus sheath inferiorly, and past the lateral border of the pectoralis major muscle laterally. Hemostasis was achieved in the flaps. Next, the breast tissue and underlying pectoralis fascia were excised from the pectoralis major muscle, progressing from medially to laterally. At the lateral border of the pectoralis major muscle, the breast tissue was swung laterally  and a lateral pedicle identified where breast tissue gave way to fat of axilla. The lateral pedicle was incised and the specimen removed.   Specimen marked long lateral, short superior and sent to pathology. ?The wound was irrigated and hemostasis was achieved. Closed suction drains were brought into the operative field through a separate stab incision and sutured to the skin with a 3-0 nylon suture. The wound was closed with interrupted 3-0 Vicryl to the subcutaneous layer, followed by staples. The wound was dressed with honey comb and drain dressing, mastectomy bra applied. ? ?The patient tolerated the procedure well and was taken to the postanesthesia care unit in stable condition.  ? ? ?

## 2021-07-29 NOTE — Anesthesia Preprocedure Evaluation (Signed)
Anesthesia Evaluation  ?Patient identified by MRN, date of birth, ID band ?Patient awake ? ? ? ?Reviewed: ?Allergy & Precautions, NPO status , Patient's Chart, lab work & pertinent test results ? ?Airway ?Mallampati: II ? ?TM Distance: >3 FB ?Neck ROM: Full ? ? ? Dental ? ?(+) Teeth Intact, Caps,  ?  ?Pulmonary ?neg pulmonary ROS,  ?  ?Pulmonary exam normal ?breath sounds clear to auscultation ? ? ? ? ? ? Cardiovascular ?Exercise Tolerance: Good ?hypertension, Pt. on medications ?negative cardio ROS ?Normal cardiovascular exam ?Rhythm:Regular Rate:Normal ? ? ?  ?Neuro/Psych ?negative neurological ROS ? negative psych ROS  ? GI/Hepatic ?negative GI ROS, Neg liver ROS,   ?Endo/Other  ?negative endocrine ROS ? Renal/GU ?negative Renal ROS  ?negative genitourinary ?  ?Musculoskeletal ? ?(+) Arthritis ,  ? Abdominal ?Normal abdominal exam  (+)   ?Peds ?negative pediatric ROS ?(+)  Hematology ?negative hematology ROS ?(+)   ?Anesthesia Other Findings ?Past Medical History: ?No date: Back pain ?2004: Breast cancer ([place]) ?    Comment:  right breast cancer ?No date: Cervical polyp ?05/24/2021: Deep vein thrombosis (DVT) of popliteal vein of right  ?lower extremity ([place]) ?No date: Degenerative arthritis of right knee ?No date: Diverticulosis ?No date: Family history of adverse reaction to anesthesia ?    Comment:  brother became combative in post op ?No date: Hypertension ?No date: Pneumonia ?    Comment:  as a child "drank kerosine when a baby" ?No date: PONV (postoperative nausea and vomiting) ?    Comment:  x1 nausea only ?No date: Status post radiation therapy ?    Comment:  2004 breast cancer ?No date: Vitamin D deficiency ? ?Past Surgical History: ?No date: BREAST BIOPSY; Left ?    Comment:  1991 negative 2006 negative ?No date: BREAST BIOPSY; Bilateral ?04/29/2021: BREAST BIOPSY ?    Comment:  X2  12:00 heart-fat necrosis, 12:30 coil-IMC ?No date: BREAST EXCISIONAL BIOPSY; Right ?     Comment:  positive 2004  ?2004: BREAST LUMPECTOMY; Right ?05/22/2021: BREAST LUMPECTOMY,RADIO FREQ LOCALIZER,AXILLARY SENTINEL  ?LYMPH NODE BIOPSY; Right ?    Comment:  Procedure: Re-excision BREAST LUMPECTOMY,RADIO FREQ  ?             LOCALIZER,AXILLARY SENTINEL LYMPH NODE BIOPSY;  Surgeon:  ?             Lysle Pearl, Isami, DO;  Location: [facility] ORS;  Service: General; ?             Laterality: Right; ?No date: COLONOSCOPY ?    Comment:  2008, 2011, 2022 ?No date: DILATION AND CURETTAGE OF UTERUS ?1991: EXPLORATORY LAPAROTOMY ?    Comment:  infertility ?07/06/2018: KNEE ARTHROPLASTY; Right ?    Comment:  Procedure: COMPUTER ASSISTED TOTAL KNEE ARTHROPLASTY;   ?             Surgeon: Dereck Leep, MD;  Location: [facility] ORS;   ?             Service: Orthopedics;  Laterality: Right; ?02/10/2021: KNEE ARTHROPLASTY; Left ?    Comment:  Procedure: COMPUTER ASSISTED TOTAL KNEE ARTHROPLASTY;   ?             Surgeon: Dereck Leep, MD;  Location: [facility] ORS;   ?             Service: Orthopedics;  Laterality: Left; ?1960: [place]; Left ?    Comment:  age 32 (CROSS EYE SURGERY) ? ? ? ? Reproductive/Obstetrics ?negative OB  ROS ? ?  ? ? ? ? ? ? ? ? ? ? ? ? ? ?  ?  ? ? ? ? ? ? ? ? ?Anesthesia Physical ?Anesthesia Plan ? ?ASA: 2 ? ?Anesthesia Plan: General  ? ?Post-op Pain Management:   ? ?Induction: Intravenous ? ?PONV Risk Score and Plan: 1 and Ondansetron and Dexamethasone ? ?Airway Management Planned: LMA ? ?Additional Equipment:  ? ?Intra-op Plan:  ? ?Post-operative Plan: Extubation in OR ? ?Informed Consent: I have reviewed the patients History and Physical, chart, labs and discussed the procedure including the risks, benefits and alternatives for the proposed anesthesia with the patient or authorized representative who has indicated his/her understanding and acceptance.  ? ? ? ?Dental Advisory Given ? ?Plan Discussed with: Anesthesiologist, CRNA and Surgeon ? ?Anesthesia Plan Comments:   ? ? ? ? ? ? ?Anesthesia Quick  Evaluation ? ?

## 2021-07-29 NOTE — Transfer of Care (Signed)
Immediate Anesthesia Transfer of Care Note ? ?Patient: Regina Blanchard ? ?Procedure(s) Performed: TOTAL MASTECTOMY (Right: Breast) ? ?Patient Location: PACU ? ?Anesthesia Type:General ? ?Level of Consciousness: drowsy and patient cooperative ? ?Airway & Oxygen Therapy: Patient Spontanous Breathing and Patient connected to face mask oxygen ? ?Post-op Assessment: Report given to RN and Post -op Vital signs reviewed and stable ? ?Post vital signs: Reviewed and stable ? ?Last Vitals:  ?Vitals Value Taken Time  ?BP 128/67 07/29/21 1157  ?Temp    ?Pulse 85 07/29/21 1200  ?Resp 16 07/29/21 1200  ?SpO2 100 % 07/29/21 1200  ?Vitals shown include unvalidated device data. ? ?Last Pain:  ?Vitals:  ? 07/29/21 0721  ?TempSrc: Temporal  ?PainSc: 0-No pain  ?   ? ?  ? ?Complications: No notable events documented. ?

## 2021-07-29 NOTE — Anesthesia Procedure Notes (Signed)
Procedure Name: LMA Insertion ?Date/Time: 07/29/2021 9:54 AM ?Performed by: Jerrye Noble, CRNA ?Pre-anesthesia Checklist: Patient identified, Emergency Drugs available, Suction available and Patient being monitored ?Patient Re-evaluated:Patient Re-evaluated prior to induction ?Oxygen Delivery Method: Circle system utilized ?Preoxygenation: Pre-oxygenation with 100% oxygen ?Induction Type: IV induction ?Ventilation: Mask ventilation without difficulty ?LMA: LMA inserted ?LMA Size: 3.5 ?Placement Confirmation: positive ETCO2 and breath sounds checked- equal and bilateral ?Tube secured with: Tape ?Dental Injury: Teeth and Oropharynx as per pre-operative assessment  ? ? ? ? ?

## 2021-07-29 NOTE — Interval H&P Note (Signed)
No change. OK to proceed.

## 2021-07-29 NOTE — Progress Notes (Signed)
Notified patient husband that she has been assigned to room 230, and will notify when it is clean ?

## 2021-07-30 ENCOUNTER — Encounter: Payer: Self-pay | Admitting: Surgery

## 2021-07-30 DIAGNOSIS — D0511 Intraductal carcinoma in situ of right breast: Secondary | ICD-10-CM | POA: Diagnosis not present

## 2021-07-30 MED ORDER — HYDROCODONE-ACETAMINOPHEN 5-325 MG PO TABS: 1.0000 | ORAL_TABLET | Freq: Four times a day (QID) | ORAL | 0 refills | Status: DC | PRN

## 2021-07-30 MED ORDER — ACETAMINOPHEN 325 MG PO TABS: 650.0000 mg | ORAL_TABLET | Freq: Three times a day (TID) | ORAL | 0 refills | Status: AC | PRN

## 2021-07-30 MED ORDER — DOCUSATE SODIUM 100 MG PO CAPS: 100.0000 mg | ORAL_CAPSULE | Freq: Two times a day (BID) | ORAL | 0 refills | Status: AC | PRN

## 2021-07-30 NOTE — Discharge Instructions (Signed)
BREAST SURGERY, Care After ?This sheet gives you information about how to care for yourself after your procedure. Your doctor may also give you more specific instructions. If you have problems or questions, contact your doctor. ?Follow these instructions at home: ?Care for cuts from surgery (incisions) ? ?  ?Follow instructions from your doctor about how to take care of your cuts from surgery. Make sure you: ?Wash your hands with soap and water before you change your bandage (dressing). If you cannot use soap and water, use hand sanitizer. ?Change your bandage as told by your doctor. ?Leave stitches (sutures), skin glue, or skin tape (adhesive) strips in place. They may need to stay in place for 2 weeks or longer. If tape strips get loose and curl up, you may trim the loose edges. Do not remove tape strips completely unless your doctor says it is okay. ?Do not take baths, swim, or use a hot tub until your doctor says it is okay. OK TO SHOWER IN 24HRS  ?Check your surgical cut area every day for signs of infection. Check for: ?More redness, swelling, or pain. ?More fluid or blood. ?Warmth. ?Pus or a bad smell. ?Activity ?Do not drive or use heavy machinery while taking prescription pain medicine. ?Do not play contact sports until your doctor says it is okay. ?Do not drive for 24 hours if you were given a medicine to help you relax (sedative). ?Rest as needed. Do not return to work or school until your doctor says it is okay. ?Wear supportive bras as tolerated to minimize discomfort ?General instructions ?Continue lovenox bridge as and restarting eliquis as discussed with Dr. Sabra Heck  ?tylenol as needed for discomfort.   ? Use narcotics, if prescribed, only when tylenol and motrin is not enough to control pain. ? 325-'650mg'$  every 8hrs to max of '4000mg'$ /24hrs (including the '325mg'$  in every norco dose) for the tylenol.   ?To prevent or treat constipation while you are taking prescription pain medicine, your doctor may  recommend that you: ?Drink enough fluid to keep your pee (urine) clear or pale yellow. ?Take over-the-counter or prescription medicines. ?Eat foods that are high in fiber, such as fresh fruits and vegetables, whole grains, and beans. ?Limit foods that are high in fat and processed sugars, such as fried and sweet foods. ?Contact a doctor if: ?You develop a rash. ?You have more redness, swelling, or pain around your surgical cuts. ?You have more fluid or blood coming from your surgical cuts. ?Your surgical cuts feel warm to the touch. ?You have pus or a bad smell coming from your surgical cuts. ?You have a fever. ?One or more of your surgical cuts breaks open. ?You have trouble breathing. ?You have chest pain. ?You have pain that is getting worse in your shoulders. ?You faint or feel dizzy when you stand. ?You have very bad pain in your belly (abdomen). ?You are sick to your stomach (nauseous) for more than one day. ?You have throwing up (vomiting) that lasts for more than one day. ?You have leg pain. ?This information is not intended to replace advice given to you by your health care provider. Make sure you discuss any questions you have with your health care provider. ? ?

## 2021-07-30 NOTE — TOC CM/SW Note (Signed)
Patient has orders to discharge home today. Chart reviewed. PCP is Emily Filbert, MD. On room air. Has incision on right breast (honeycomb dressing). No TOC needs identified. CSW signing off. ? ?Dayton Scrape, [place] ?[phone] ? ?

## 2021-07-30 NOTE — Progress Notes (Signed)
Patient discharged home via private vehicle with husband. All discharge instructions were reviewed with verbal understanding. PIV removed with no swelling or redness to the site.  ?

## 2021-07-31 NOTE — Discharge Summary (Signed)
Physician Discharge Summary  ?Patient ID: ?Regina Blanchard ?MRN: [id number] ?DOB/AGE: July 25, 1950 71 y.o. ? ?Admit date: 07/29/2021 ?Discharge date: 07/30/21 ? ?Admission Diagnoses: DCIS, right ? ?Discharge Diagnoses:  ?Same as above ? ?Discharged Condition: good ? ?Hospital Course: admitted for above. Underwent surgery.  Please see op note for details.  Post op, recovered as expected.  At time of d/c, tolerating diet and pain controlled, JP with minimal output. ? ?Consults: None ? ?Discharge Exam: ?Blood pressure 140/71, pulse 77, temperature 97.8 ?F (36.6 ?C), temperature source Oral, resp. rate 17, height '5\' 8"'$  (1.727 m), weight 85.3 kg, SpO2 99 %. ?General appearance: alert, cooperative, and no distress ?Breasts: mastectomy incision site C/D/I staples.  JP with sanguinous minimal discharge as expected.  Pain controlled ? ?Disposition:  ?Discharge disposition: 01-Home or Self Care ? ? ? ? ? ? ?Discharge Instructions   ? ? Discharge patient   Complete by: As directed ?  ? Discharge disposition: 01-Home or Self Care  ? Discharge patient date: 07/30/2021  ? ?  ? ?Allergies as of 07/30/2021   ?No Known Allergies ?  ? ?  ?Medication List  ?  ? ?TAKE these medications   ? ?acetaminophen 325 MG tablet ?Commonly known as: Tylenol ?Take 2 tablets (650 mg total) by mouth every 8 (eight) hours as needed for mild pain. ?  ?ALPRAZolam 0.25 MG tablet ?Commonly known as: Duanne Moron ?Take 0.25 mg by mouth daily as needed for anxiety or sleep. ?  ?apixaban 5 MG Tabs tablet ?Commonly known as: ELIQUIS ?Take 5 mg by mouth 2 (two) times daily. ?Notes to patient: Resume as discussed with Dr. Sabra Heck ?  ?baclofen 10 MG tablet ?Commonly known as: LIORESAL ?Take 10 mg by mouth daily as needed for muscle spasms. ?  ?docusate sodium 100 MG capsule ?Commonly known as: Colace ?Take 1 capsule (100 mg total) by mouth 2 (two) times daily as needed for up to 10 days for mild constipation. ?  ?enoxaparin 60 MG/0.6ML injection ?Commonly known as: LOVENOX ?Inject  60 mg into the skin every 12 (twelve) hours. Starting 07/24/2021 - 07/28/2021 for procedure on 07/29/2021 ?Notes to patient: Resume as discussed with Dr. Sabra Heck ?  ?fluticasone 50 MCG/ACT nasal spray ?Commonly known as: FLONASE ?Place 1 spray into both nostrils in the morning and at bedtime. ?  ?HYDROcodone-acetaminophen 5-325 MG tablet ?Commonly known as: Norco ?Take 1 tablet by mouth every 6 (six) hours as needed for up to 6 doses for moderate pain. ?  ?multivitamin capsule ?Take 3 capsules by mouth daily. Balance of nature ?  ?olmesartan-hydrochlorothiazide 20-12.5 MG tablet ?Commonly known as: BENICAR HCT ?Take 1 tablet by mouth daily. ?  ?ondansetron 4 MG disintegrating tablet ?Commonly known as: ZOFRAN-ODT ?Take 4 mg by mouth every 8 (eight) hours as needed for nausea/vomiting, vomiting or nausea. ?  ?promethazine-dextromethorphan 6.25-15 MG/5ML syrup ?Commonly known as: PROMETHAZINE-DM ?Take 5 mLs by mouth every 6 (six) hours as needed for cough. ?  ?tiZANidine 4 MG tablet ?Commonly known as: ZANAFLEX ?Take 4 mg by mouth 2 (two) times daily as needed for muscle spasms. ?  ?Vitamin D 50 MCG (2000 UT) Caps ?Take 6,000 Units by mouth daily. ?  ? ?  ? ? Follow-up Information   ? ? Gaudencio Chesnut, DO Follow up in 2 week(s).   ?Specialty: Surgery ?Why: as schedule for post op mastectomy ?Contact information: ?[address] ?[place] [address] ?[phone] ? ? ?  ?  ? ?  ?  ? ?  ? ? ? ?  Total time spent arranging discharge was >4mn. ?Signed: ?IBenjamine Sprague?07/31/2021, 10:51 AM ? ?

## 2021-08-01 ENCOUNTER — Other Ambulatory Visit: Payer: Self-pay | Admitting: Anatomic Pathology & Clinical Pathology

## 2021-08-01 LAB — SURGICAL PATHOLOGY

## 2021-08-10 NOTE — Progress Notes (Signed)
?[place]  ?[phone]) [phone] [phone] ? ?ID: Regina Blanchard OB: 1950/06/27  MR#: [id number]  LFY#:[id number] ? ?Patient Care Team: ?Rusty Aus, MD as PCP - General (Internal Medicine) ? 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 ? ?INTERVAL HISTORY: Patient returns to clinic today for further evaluation and initiation of an aromatase inhibitor.  She underwent full mastectomy for reexcision of positive margins on July 29, 2021.  She tolerated the procedure well.  She currently feels well and is asymptomatic.  She has no neurologic complaints.  She denies any recent fevers or illnesses.  She has a good appetite and denies weight loss.  She has no chest pain, shortness of breath, cough, or hemoptysis.  She denies any nausea, vomiting, constipation, or diarrhea.  She has no urinary complaints.  Patient offers no specific complaints today. ? ?REVIEW OF SYSTEMS:   ?Review of Systems  ?Constitutional: Negative.  Negative for fever, malaise/fatigue and weight loss.  ?Respiratory: Negative.  Negative for cough and shortness of breath.   ?Cardiovascular: Negative.  Negative for chest pain and leg swelling.  ?Gastrointestinal: Negative.  Negative for abdominal pain, blood in stool and melena.  ?Genitourinary:  Negative for dysuria.  ?Musculoskeletal: Negative.  Negative for back pain.  ?Skin: Negative.  Negative for rash.  ?Neurological: Negative.  Negative for dizziness, focal weakness, weakness and headaches.  ?Psychiatric/Behavioral: Negative.  The patient is not nervous/anxious.   ? ?As per HPI. Otherwise, a complete review of systems is negative. ? ?PAST MEDICAL HISTORY: ?Past Medical History:  ?Diagnosis Date  ? Back pain   ? Breast cancer ([place]) 2004  ? right breast cancer  ? Cervical polyp   ? Deep vein thrombosis (DVT) of popliteal vein of right lower extremity ([place]) 05/24/2021  ? Degenerative  arthritis of right knee   ? Diverticulosis   ? Family history of adverse reaction to anesthesia   ? brother became combative in post op  ? Hypertension   ? Pneumonia   ? as a child "drank kerosine when a baby"  ? PONV (postoperative nausea and vomiting)   ? x1 nausea only  ? Status post radiation therapy   ? 2004 breast cancer  ? Vitamin D deficiency   ? ? ?PAST SURGICAL HISTORY: ?Past Surgical History:  ?Procedure Laterality Date  ? BREAST BIOPSY Left   ? 1991 negative 2006 negative  ? BREAST BIOPSY Bilateral   ? BREAST BIOPSY  04/29/2021  ? X2  12:00 heart-fat necrosis, 12:30 coil-[facility]  ? BREAST EXCISIONAL BIOPSY Right   ? positive 2004   ? BREAST LUMPECTOMY Right 2004  ? BREAST LUMPECTOMY,RADIO FREQ LOCALIZER,AXILLARY SENTINEL LYMPH NODE BIOPSY Right 05/22/2021  ? Procedure: Re-excision BREAST LUMPECTOMY,RADIO FREQ LOCALIZER,AXILLARY SENTINEL LYMPH NODE BIOPSY;  Surgeon: Benjamine Sprague, DO;  Location: [facility] ORS;  Service: General;  Laterality: Right;  ? COLONOSCOPY    ? 2008, 2011, 2022  ? DILATION AND CURETTAGE OF UTERUS    ? EXPLORATORY LAPAROTOMY  1991  ? infertility  ? KNEE ARTHROPLASTY Right 07/06/2018  ? Procedure: COMPUTER ASSISTED TOTAL KNEE ARTHROPLASTY;  Surgeon: Dereck Leep, MD;  Location: [facility] ORS;  Service: Orthopedics;  Laterality: Right;  ? KNEE ARTHROPLASTY Left 02/10/2021  ? Procedure: COMPUTER ASSISTED TOTAL KNEE ARTHROPLASTY;  Surgeon: Dereck Leep, MD;  Location: [facility] ORS;  Service: Orthopedics;  Laterality: Left;  ? [place]  ? age 53 (CROSS EYE SURGERY)  ?  TOTAL MASTECTOMY Right 07/29/2021  ? Procedure: TOTAL MASTECTOMY;  Surgeon: Benjamine Sprague, DO;  Location: [facility] ORS;  Service: General;  Laterality: Right;  ? ? ?FAMILY HISTORY: ?Family History  ?Problem Relation Age of Onset  ? Breast cancer Maternal Aunt 48  ? Diabetes Mother   ? Heart disease Father   ? Diabetes Sister   ? Breast cancer Paternal Aunt   ? Colon cancer Maternal Aunt   ? Hypertension Maternal Aunt    ? ? ?ADVANCED DIRECTIVES (Y/N):  N ? ?HEALTH MAINTENANCE: ?Social History  ? ?Tobacco Use  ? Smoking status: Never  ? Smokeless tobacco: Never  ?Vaping Use  ? Vaping Use: Never used  ?Substance Use Topics  ? Alcohol use: No  ? Drug use: No  ? ? ? Colonoscopy: ? PAP: ? Bone density: ? Lipid panel: ? ?No Known Allergies ? ?Current Outpatient Medications  ?Medication Sig Dispense Refill  ? ALPRAZolam (XANAX) 0.25 MG tablet Take 0.25 mg by mouth daily as needed for anxiety or sleep.     ? apixaban (ELIQUIS) 5 MG TABS tablet Take 5 mg by mouth 2 (two) times daily.    ? baclofen (LIORESAL) 10 MG tablet Take 10 mg by mouth daily as needed for muscle spasms.     ? Cholecalciferol (VITAMIN D) 50 MCG (2000 UT) CAPS Take 6,000 Units by mouth daily.    ? letrozole (FEMARA) 2.5 MG tablet Take 1 tablet (2.5 mg total) by mouth daily. 90 tablet 3  ? Multiple Vitamin (MULTIVITAMIN) capsule Take 3 capsules by mouth daily. Balance of nature    ? ondansetron (ZOFRAN-ODT) 4 MG disintegrating tablet Take 4 mg by mouth every 8 (eight) hours as needed for nausea/vomiting, vomiting or nausea.    ? promethazine-dextromethorphan (PROMETHAZINE-DM) 6.25-15 MG/5ML syrup Take 5 mLs by mouth every 6 (six) hours as needed for cough.    ? tiZANidine (ZANAFLEX) 4 MG tablet Take 4 mg by mouth 2 (two) times daily as needed for muscle spasms.    ? acetaminophen (TYLENOL) 325 MG tablet Take 2 tablets (650 mg total) by mouth every 8 (eight) hours as needed for mild pain. (Patient not taking: Reported on 08/12/2021) 40 tablet 0  ? fluticasone (FLONASE) 50 MCG/ACT nasal spray Place 1 spray into both nostrils in the morning and at bedtime. (Patient not taking: Reported on 08/12/2021)    ? HYDROcodone-acetaminophen (NORCO) 5-325 MG tablet Take 1 tablet by mouth every 6 (six) hours as needed for up to 6 doses for moderate pain. (Patient not taking: Reported on 08/12/2021) 6 tablet 0  ? olmesartan-hydrochlorothiazide (BENICAR HCT) 20-12.5 MG tablet Take 1 tablet  by mouth daily. (Patient not taking: Reported on 08/12/2021)    ? ?No current facility-administered medications for this visit.  ? ? ?OBJECTIVE: ?Vitals:  ? 08/12/21 0947  ?BP: 131/82  ?Pulse: 72  ?Resp: 16  ?Temp: (!) 96.5 ?F (35.8 ?C)  ?SpO2: 100%  ?   Body mass index is 28.89 kg/m?Marland Kitchen    ECOG FS:0 - Asymptomatic ? ?General: Well-developed, well-nourished, no acute distress. ?Eyes: Pink conjunctiva, anicteric sclera. ?HEENT: Normocephalic, moist mucous membranes. ?Breast: Right mastectomy. ?Lungs: No audible wheezing or coughing. ?Heart: Regular rate and rhythm. ?Abdomen: Soft, nontender, no obvious distention. ?Musculoskeletal: No edema, cyanosis, or clubbing. ?Neuro: Alert, answering all questions appropriately. Cranial nerves grossly intact. ?Skin: No rashes or petechiae noted. ?Psych: Normal affect. ? ? ?LAB RESULTS: ? ?Lab Results  ?Component Value Date  ? NA 143 07/23/2021  ? K 4.0 07/23/2021  ?  CL 104 07/23/2021  ? CO2 30 07/23/2021  ? GLUCOSE 98 07/23/2021  ? BUN 12 07/23/2021  ? CREATININE 0.54 07/23/2021  ? CALCIUM 9.6 07/23/2021  ? PROT 7.2 06/22/2018  ? ALBUMIN 4.1 06/22/2018  ? AST 25 06/22/2018  ? ALT 25 06/22/2018  ? ALKPHOS 90 06/22/2018  ? BILITOT 1.0 06/22/2018  ? GFRNONAA >60 07/23/2021  ? GFRAA >60 06/22/2018  ? ? ?Lab Results  ?Component Value Date  ? WBC 4.3 07/23/2021  ? NEUTROABS 1.9 12/31/2011  ? HGB 11.9 (L) 07/23/2021  ? HCT 37.8 07/23/2021  ? MCV 90.6 07/23/2021  ? PLT 232 07/23/2021  ? ? ? ?STUDIES: ?US Venous Img Lower Unilateral Right (DVT) ? ?Result Date: 07/25/2021 ?CLINICAL DATA:  History right popliteal vein DVT. EXAM: RIGHT LOWER EXTREMITY VENOUS DOPPLER ULTRASOUND TECHNIQUE: Gray-scale sonography with graded compression, as well as color Doppler and duplex ultrasound were performed to evaluate the lower extremity deep venous systems from the level of the common femoral vein and including the common femoral, femoral, profunda femoral, popliteal and calf veins including the posterior  tibial, peroneal and gastrocnemius veins when visible. The superficial great saphenous vein was also interrogated. Spectral Doppler was utilized to evaluate flow at rest and with distal augmentation maneuvers in the com

## 2021-08-12 ENCOUNTER — Inpatient Hospital Stay: Payer: Medicare PPO | Attending: Oncology | Admitting: Oncology

## 2021-08-12 ENCOUNTER — Encounter: Payer: Self-pay | Admitting: Oncology

## 2021-08-12 ENCOUNTER — Other Ambulatory Visit: Payer: Self-pay

## 2021-08-12 VITALS — BP 131/82 | HR 72 | Temp 96.5°F | Resp 16 | Ht 68.0 in | Wt 190.0 lb

## 2021-08-12 DIAGNOSIS — Z170 Estrogen receptor positive status [ER+]: Secondary | ICD-10-CM | POA: Insufficient documentation

## 2021-08-12 DIAGNOSIS — C50911 Malignant neoplasm of unspecified site of right female breast: Secondary | ICD-10-CM

## 2021-08-12 DIAGNOSIS — Z86000 Personal history of in-situ neoplasm of breast: Secondary | ICD-10-CM | POA: Insufficient documentation

## 2021-08-12 DIAGNOSIS — Z79899 Other long term (current) drug therapy: Secondary | ICD-10-CM | POA: Diagnosis not present

## 2021-08-12 DIAGNOSIS — C50411 Malignant neoplasm of upper-outer quadrant of right female breast: Secondary | ICD-10-CM | POA: Insufficient documentation

## 2021-08-12 MED ORDER — LETROZOLE 2.5 MG PO TABS: 2.5000 mg | ORAL_TABLET | Freq: Every day | ORAL | 3 refills | Status: DC

## 2021-08-13 ENCOUNTER — Ambulatory Visit
Admission: RE | Admit: 2021-08-13 | Discharge: 2021-08-13 | Disposition: A | Discharge status: Home / Self Care | Payer: Medicare PPO | Source: Ambulatory Visit | Attending: Oncology | Admitting: Oncology

## 2021-08-13 DIAGNOSIS — Z780 Asymptomatic menopausal state: Secondary | ICD-10-CM | POA: Diagnosis not present

## 2021-08-13 DIAGNOSIS — Z923 Personal history of irradiation: Secondary | ICD-10-CM | POA: Insufficient documentation

## 2021-08-13 DIAGNOSIS — M8589 Other specified disorders of bone density and structure, multiple sites: Secondary | ICD-10-CM | POA: Insufficient documentation

## 2021-08-13 DIAGNOSIS — Z13820 Encounter for screening for osteoporosis: Secondary | ICD-10-CM | POA: Insufficient documentation

## 2021-08-13 DIAGNOSIS — C50911 Malignant neoplasm of unspecified site of right female breast: Secondary | ICD-10-CM

## 2021-08-13 DIAGNOSIS — Z853 Personal history of malignant neoplasm of breast: Secondary | ICD-10-CM | POA: Insufficient documentation

## 2021-09-03 ENCOUNTER — Encounter: Payer: Self-pay | Admitting: *Deleted

## 2021-11-07 NOTE — Progress Notes (Unsigned)
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  INTERVAL HISTORY: Patient returns to clinic today for further evaluation and initiation of an aromatase inhibitor.  She underwent full mastectomy for reexcision of positive margins on July 29, 2021.  She tolerated the procedure well.  She currently feels well and is asymptomatic.  She has no neurologic complaints.  She denies any recent fevers or illnesses.  She has a good appetite and denies weight loss.  She has no chest pain, shortness of breath, cough, or hemoptysis.  She denies any nausea, vomiting, constipation, or diarrhea.  She has no urinary complaints.  Patient offers no specific complaints today.  REVIEW OF SYSTEMS:   Review of Systems  Constitutional: Negative.  Negative for fever, malaise/fatigue and weight loss.  Respiratory: Negative.  Negative for cough and shortness of breath.   Cardiovascular: Negative.  Negative for chest pain and leg swelling.  Gastrointestinal: Negative.  Negative for abdominal pain, blood in stool and melena.  Genitourinary:  Negative for dysuria.  Musculoskeletal: Negative.  Negative for back pain.  Skin: Negative.  Negative for rash.  Neurological: Negative.  Negative for dizziness, focal weakness, weakness and headaches.  Psychiatric/Behavioral: Negative.  The patient is not nervous/anxious.     As per HPI. Otherwise, a complete review of systems is negative.  PAST MEDICAL HISTORY: Past Medical History:  Diagnosis Date   Back pain    Breast cancer ([place]) 2004   right breast cancer   Cervical polyp     Deep vein thrombosis (DVT) of popliteal vein of right lower extremity ([place]) 05/24/2021   Degenerative arthritis of right knee    Diverticulosis    Family history of adverse reaction to anesthesia    brother became combative in post op   Hypertension    Pneumonia    as a child "drank kerosine when a baby"   PONV (postoperative nausea and vomiting)    x1 nausea only   Status post radiation therapy    2004 breast cancer   Vitamin D deficiency     PAST SURGICAL HISTORY: Past Surgical History:  Procedure Laterality Date   BREAST BIOPSY Left    1991 negative 2006 negative   BREAST BIOPSY Bilateral    BREAST BIOPSY  04/29/2021   X2  12:00 heart-fat necrosis, 12:30 coil-[facility]   BREAST EXCISIONAL BIOPSY Right    positive 2004    BREAST LUMPECTOMY Right 2004   BREAST LUMPECTOMY,RADIO FREQ LOCALIZER,AXILLARY SENTINEL LYMPH NODE BIOPSY Right 05/22/2021   Procedure: Re-excision BREAST LUMPECTOMY,RADIO FREQ LOCALIZER,AXILLARY SENTINEL LYMPH NODE BIOPSY;  Surgeon: Benjamine Sprague, DO;  Location: [facility] OR[facility];  Service: General;  Laterality: Right;   COLONOSCOPY     2008, 2011, 2022   [place]   infertility   KNEE ARTHROPLASTY Right 07/06/2018   Procedure: COMPUTER ASSISTED TOTAL KNEE ARTHROPLASTY;  Surgeon: Dereck Leep, MD;  Location: [facility] OR[facility];  Service: Orthopedics;  Laterality: Right;   KNEE ARTHROPLASTY Left 02/10/2021   Procedure: COMPUTER ASSISTED TOTAL KNEE ARTHROPLASTY;  Surgeon: Dereck Leep, MD;  Location: [facility] OR[facility];  Service: Orthopedics;  Laterality:  Left;   SQUINT REPAIR Left 1960   age 26 (CROSS EYE SURGERY)   TOTAL MASTECTOMY Right 07/29/2021   Procedure: TOTAL MASTECTOMY;  Surgeon: Benjamine Sprague, DO;  Location: [facility] OR[facility];  Service: General;  Laterality: Right;    FAMILY HISTORY: Family History  Problem Relation Age of Onset   Breast cancer Maternal Aunt 17   Diabetes Mother    Heart disease Father    Diabetes Sister     Breast cancer Paternal Aunt    Colon cancer Maternal Aunt    Hypertension Maternal Aunt     ADVANCED DIRECTIVES (Y/N):  N  HEALTH MAINTENANCE: Social History   Tobacco Use   Smoking status: Never   Smokeless tobacco: Never  Vaping Use   Vaping Use: Never used  Substance Use Topics   Alcohol use: No   Drug use: No     Colonoscopy:  PAP:  Bone density:  Lipid panel:  No Known Allergies  Current Outpatient Medications  Medication Sig Dispense Refill   ALPRAZolam (XANAX) 0.25 MG tablet Take 0.25 mg by mouth daily as needed for anxiety or sleep.      apixaban (ELIQUIS) 5 MG TABS tablet Take 5 mg by mouth 2 (two) times daily.     baclofen (LIORESAL) 10 MG tablet Take 10 mg by mouth daily as needed for muscle spasms.      Cholecalciferol (VITAMIN D) 50 MCG (2000 UT) CAPS Take 6,000 Units by mouth daily.     fluticasone (FLONASE) 50 MCG/ACT nasal spray Place 1 spray into both nostrils in the morning and at bedtime. (Patient not taking: Reported on 08/12/2021)     HYDROcodone-acetaminophen (NORCO) 5-325 MG tablet Take 1 tablet by mouth every 6 (six) hours as needed for up to 6 doses for moderate pain. (Patient not taking: Reported on 08/12/2021) 6 tablet 0   letrozole (FEMARA) 2.5 MG tablet Take 1 tablet (2.5 mg total) by mouth daily. 90 tablet 3   Multiple Vitamin (MULTIVITAMIN) capsule Take 3 capsules by mouth daily. Balance of nature     olmesartan-hydrochlorothiazide (BENICAR HCT) 20-12.5 MG tablet Take 1 tablet by mouth daily. (Patient not taking: Reported on 08/12/2021)     ondansetron (ZOFRAN-ODT) 4 MG disintegrating tablet Take 4 mg by mouth every 8 (eight) hours as needed for nausea/vomiting, vomiting or nausea.     promethazine-dextromethorphan (PROMETHAZINE-DM) 6.25-15 MG/5ML syrup Take 5 mLs by mouth every 6 (six) hours as needed for cough.     tiZANidine (ZANAFLEX) 4 MG tablet Take 4 mg by mouth 2 (two) times daily as needed for muscle spasms.     No current  facility-administered medications for this visit.    OBJECTIVE: There were no vitals filed for this visit.    There is no height or weight on file to calculate BMI.    ECOG FS:0 - Asymptomatic  General: Well-developed, well-nourished, no acute distress. Eyes: Pink conjunctiva, anicteric sclera. HEENT: Normocephalic, moist mucous membranes. Breast: Right mastectomy. Lungs: No audible wheezing or coughing. Heart: Regular rate and rhythm. Abdomen: Soft, nontender, no obvious distention. Musculoskeletal: No edema, cyanosis, or clubbing. Neuro: Alert, answering all questions appropriately. Cranial nerves grossly intact. Skin: No rashes or petechiae noted. Psych: Normal affect.   LAB RESULTS:  Lab Results  Component Value Date   NA 143 07/23/2021   K 4.0 07/23/2021   CL 104 07/23/2021   CO2 30 07/23/2021   GLUCOSE 98 07/23/2021   BUN 12 07/23/2021   CREATININE 0.54 07/23/2021   CALCIUM 9.6  07/23/2021   PROT 7.2 06/22/2018   ALBUMIN 4.1 06/22/2018   AST 25 06/22/2018   ALT 25 06/22/2018   ALKPHOS 90 06/22/2018   BILITOT 1.0 06/22/2018   GFRNONAA >60 07/23/2021   GFRAA >60 06/22/2018    Lab Results  Component Value Date   WBC 4.3 07/23/2021   NEUTROABS 1.9 12/31/2011   HGB 11.9 (L) 07/23/2021   HCT 37.8 07/23/2021   MCV 90.6 07/23/2021   PLT 232 07/23/2021     STUDIES: No results found.  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  Oncotype DX score low risk of 18.  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  She then waited until Eliquis was discontinued to have her total mastectomy which occurred on July 29, 2021.  She has a low risk Oncotype score, therefore adjuvant chemotherapy is not needed.  Because she underwent mastectomy, she does not  require adjuvant XRT.  Patient was given a prescription for letrozole which she will take for a total of 5 years completing treatment in March 2028.  No further intervention is needed at this time.  Return to clinic in 3 months for routine evaluation.   Genetic testing: Patient does not have any biologic children.  A referral was previously sent to genetics. 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 Bone health: Patient underwent baseline bone mineral density on August 13, 2021 which revealed a T score of -1.7.  Recommended calcium and vitamin D supplementation.  Repeat in March 2024.  I spent a total of 30 minutes reviewing chart data, face-to-face evaluation with the patient, counseling and coordination of care as detailed above.   Patient expressed understanding and was in agreement with this plan. She also understands that She can call clinic at any time with any questions, concerns, or complaints.    Cancer Staging  Invasive ductal carcinoma of right breast [facility]) Staging form: Breast, AJCC 8th Edition - Pathologic stage from 06/06/2021: Stage IA (pT1b, pN0, cM0, G2, ER+, PR+, HER2-) - Signed by Lloyd Huger, MD on 06/06/2021 Stage prefix: Initial diagnosis Histologic grading system: 3 grade system  Lloyd Huger, MD   11/07/2021 12:16 PM

## 2021-11-11 ENCOUNTER — Encounter: Payer: Self-pay | Admitting: Oncology

## 2021-11-11 ENCOUNTER — Inpatient Hospital Stay: Payer: Medicare PPO | Attending: Oncology | Admitting: Oncology

## 2021-11-11 VITALS — BP 147/82 | HR 78 | Temp 97.6°F | Resp 16 | Wt 191.9 lb

## 2021-11-11 DIAGNOSIS — Z79811 Long term (current) use of aromatase inhibitors: Secondary | ICD-10-CM | POA: Diagnosis not present

## 2021-11-11 DIAGNOSIS — Z170 Estrogen receptor positive status [ER+]: Secondary | ICD-10-CM | POA: Insufficient documentation

## 2021-11-11 DIAGNOSIS — Z9011 Acquired absence of right breast and nipple: Secondary | ICD-10-CM | POA: Diagnosis not present

## 2021-11-11 DIAGNOSIS — Z86000 Personal history of in-situ neoplasm of breast: Secondary | ICD-10-CM | POA: Diagnosis not present

## 2021-11-11 DIAGNOSIS — C50911 Malignant neoplasm of unspecified site of right female breast: Secondary | ICD-10-CM | POA: Diagnosis not present

## 2021-11-11 DIAGNOSIS — C50411 Malignant neoplasm of upper-outer quadrant of right female breast: Secondary | ICD-10-CM | POA: Diagnosis present

## 2021-11-11 NOTE — Progress Notes (Signed)
Survivorship Care Plan visit completed.  Treatment summary reviewed and given to patient.  ASCO answers booklet reviewed and given to patient.  CARE program and Cancer Transitions discussed with patient along with other resources cancer center offers to patients and caregivers.  Patient verbalized understanding.    

## 2021-11-11 NOTE — Progress Notes (Signed)
Pt in for follow up, reports some tenderness under right arm that patient is concerned about.

## 2022-02-25 ENCOUNTER — Other Ambulatory Visit
Admission: RE | Admit: 2022-02-25 | Discharge: 2022-02-25 | Disposition: A | Discharge status: Home / Self Care | Payer: Medicare PPO | Source: Ambulatory Visit | Attending: Internal Medicine | Admitting: Internal Medicine

## 2022-02-25 DIAGNOSIS — I2510 Atherosclerotic heart disease of native coronary artery without angina pectoris: Secondary | ICD-10-CM | POA: Diagnosis present

## 2022-02-25 DIAGNOSIS — I2089 Other forms of angina pectoris: Secondary | ICD-10-CM | POA: Insufficient documentation

## 2022-02-25 DIAGNOSIS — I480 Paroxysmal atrial fibrillation: Secondary | ICD-10-CM | POA: Insufficient documentation

## 2022-02-25 LAB — BRAIN NATRIURETIC PEPTIDE: B Natriuretic Peptide: 21.2 pg/mL (ref 0.0–100.0)

## 2022-02-27 ENCOUNTER — Other Ambulatory Visit: Payer: Self-pay | Admitting: Internal Medicine

## 2022-02-27 DIAGNOSIS — R079 Chest pain, unspecified: Secondary | ICD-10-CM

## 2022-02-27 DIAGNOSIS — R0989 Other specified symptoms and signs involving the circulatory and respiratory systems: Secondary | ICD-10-CM

## 2022-03-02 ENCOUNTER — Ambulatory Visit
Admission: RE | Admit: 2022-03-02 | Discharge: 2022-03-02 | Disposition: A | Discharge status: Home / Self Care | Payer: Medicare PPO | Source: Ambulatory Visit | Attending: Internal Medicine | Admitting: Internal Medicine

## 2022-03-02 DIAGNOSIS — R0989 Other specified symptoms and signs involving the circulatory and respiratory systems: Secondary | ICD-10-CM | POA: Insufficient documentation

## 2022-03-02 DIAGNOSIS — R079 Chest pain, unspecified: Secondary | ICD-10-CM | POA: Diagnosis present

## 2022-03-02 MED ORDER — IOHEXOL 350 MG/ML SOLN
75.0000 mL | Freq: Once | INTRAVENOUS | Status: AC | PRN
  Administered 2022-03-02: 75 mL via INTRAVENOUS

## 2022-03-23 ENCOUNTER — Encounter (INDEPENDENT_AMBULATORY_CARE_PROVIDER_SITE_OTHER): Payer: Self-pay

## 2022-03-26 ENCOUNTER — Encounter: Payer: Self-pay | Admitting: Internal Medicine

## 2022-03-26 ENCOUNTER — Other Ambulatory Visit: Payer: Self-pay

## 2022-03-26 ENCOUNTER — Encounter
Admission: RE | Disposition: A | Discharge status: Home / Self Care | Payer: Self-pay | Source: Ambulatory Visit | Attending: Internal Medicine

## 2022-03-26 ENCOUNTER — Ambulatory Visit
Admission: RE | Admit: 2022-03-26 | Discharge: 2022-03-26 | Disposition: A | Discharge status: Home / Self Care | Payer: Medicare PPO | Source: Ambulatory Visit | Attending: Internal Medicine | Admitting: Internal Medicine

## 2022-03-26 DIAGNOSIS — I200 Unstable angina: Secondary | ICD-10-CM | POA: Insufficient documentation

## 2022-03-26 HISTORY — PX: LEFT HEART CATH AND CORONARY ANGIOGRAPHY: CATH118249

## 2022-03-26 SURGERY — LEFT HEART CATH AND CORONARY ANGIOGRAPHY
Anesthesia: Moderate Sedation | Laterality: Left

## 2022-03-26 MED ORDER — IOHEXOL 300 MG/ML  SOLN
INTRAMUSCULAR | Status: DC | PRN
  Administered 2022-03-26: 52 mL

## 2022-03-26 MED ORDER — LIDOCAINE HCL 1 % IJ SOLN
INTRAMUSCULAR | Status: AC
  Filled 2022-03-26: qty 20

## 2022-03-26 MED ORDER — SODIUM CHLORIDE 0.9 % IV SOLN: 250.0000 mL | INTRAVENOUS | Status: DC | PRN

## 2022-03-26 MED ORDER — FENTANYL CITRATE (PF) 100 MCG/2ML IJ SOLN
INTRAMUSCULAR | Status: AC
  Filled 2022-03-26: qty 2

## 2022-03-26 MED ORDER — HYDRALAZINE HCL 20 MG/ML IJ SOLN: 10.0000 mg | INTRAMUSCULAR | Status: DC | PRN

## 2022-03-26 MED ORDER — SODIUM CHLORIDE 0.9 % WEIGHT BASED INFUSION
1.0000 mL/kg/h | INTRAVENOUS | Status: DC
  Administered 2022-03-26: 1 mL/kg/h via INTRAVENOUS

## 2022-03-26 MED ORDER — ACETAMINOPHEN 325 MG PO TABS: 650.0000 mg | ORAL_TABLET | ORAL | Status: DC | PRN

## 2022-03-26 MED ORDER — FENTANYL CITRATE (PF) 100 MCG/2ML IJ SOLN
INTRAMUSCULAR | Status: DC | PRN
  Administered 2022-03-26: 25 ug via INTRAVENOUS

## 2022-03-26 MED ORDER — VERAPAMIL HCL 2.5 MG/ML IV SOLN
INTRAVENOUS | Status: DC | PRN
  Administered 2022-03-26: 2.5 mg via INTRAVENOUS

## 2022-03-26 MED ORDER — VERAPAMIL HCL 2.5 MG/ML IV SOLN
INTRAVENOUS | Status: AC
  Filled 2022-03-26: qty 2

## 2022-03-26 MED ORDER — MIDAZOLAM HCL 2 MG/2ML IJ SOLN
INTRAMUSCULAR | Status: AC
  Filled 2022-03-26: qty 2

## 2022-03-26 MED ORDER — HEPARIN SODIUM (PORCINE) 1000 UNIT/ML IJ SOLN
INTRAMUSCULAR | Status: DC | PRN
  Administered 2022-03-26: 4000 [IU] via INTRAVENOUS

## 2022-03-26 MED ORDER — SODIUM CHLORIDE 0.9% FLUSH: 3.0000 mL | INTRAVENOUS | Status: DC | PRN

## 2022-03-26 MED ORDER — SODIUM CHLORIDE 0.9% FLUSH: 3.0000 mL | Freq: Two times a day (BID) | INTRAVENOUS | Status: DC

## 2022-03-26 MED ORDER — METHYLPREDNISOLONE SODIUM SUCC 125 MG IJ SOLR
INTRAMUSCULAR | Status: DC | PRN
  Administered 2022-03-26: 60 mg via INTRAVENOUS

## 2022-03-26 MED ORDER — ONDANSETRON HCL 4 MG/2ML IJ SOLN: 4.0000 mg | Freq: Four times a day (QID) | INTRAMUSCULAR | Status: DC | PRN

## 2022-03-26 MED ORDER — METHYLPREDNISOLONE SODIUM SUCC 125 MG IJ SOLR
INTRAMUSCULAR | Status: AC
  Filled 2022-03-26: qty 2

## 2022-03-26 MED ORDER — DIPHENHYDRAMINE HCL 50 MG/ML IJ SOLN
INTRAMUSCULAR | Status: AC
  Filled 2022-03-26: qty 1

## 2022-03-26 MED ORDER — HEPARIN (PORCINE) IN NACL 1000-0.9 UT/500ML-% IV SOLN
INTRAVENOUS | Status: DC | PRN
  Administered 2022-03-26 (×2): 500 mL

## 2022-03-26 MED ORDER — SODIUM CHLORIDE 0.9 % WEIGHT BASED INFUSION: 1.0000 mL/kg/h | INTRAVENOUS | Status: DC

## 2022-03-26 MED ORDER — SODIUM CHLORIDE 0.9 % WEIGHT BASED INFUSION
3.0000 mL/kg/h | INTRAVENOUS | Status: AC
  Administered 2022-03-26: 3 mL/kg/h via INTRAVENOUS

## 2022-03-26 MED ORDER — DIPHENHYDRAMINE HCL 50 MG/ML IJ SOLN
INTRAMUSCULAR | Status: DC | PRN
  Administered 2022-03-26: 50 mg via INTRAVENOUS

## 2022-03-26 MED ORDER — HEPARIN (PORCINE) IN NACL 1000-0.9 UT/500ML-% IV SOLN
INTRAVENOUS | Status: AC
  Filled 2022-03-26: qty 1000

## 2022-03-26 MED ORDER — ASPIRIN 81 MG PO CHEW: 81.0000 mg | CHEWABLE_TABLET | ORAL | Status: DC

## 2022-03-26 MED ORDER — MIDAZOLAM HCL 2 MG/2ML IJ SOLN
INTRAMUSCULAR | Status: DC | PRN
  Administered 2022-03-26: 1 mg via INTRAVENOUS

## 2022-03-26 MED ORDER — LIDOCAINE HCL (PF) 1 % IJ SOLN
INTRAMUSCULAR | Status: DC | PRN
  Administered 2022-03-26: 2 mL

## 2022-03-26 MED ORDER — HEPARIN SODIUM (PORCINE) 1000 UNIT/ML IJ SOLN
INTRAMUSCULAR | Status: AC
  Filled 2022-03-26: qty 10

## 2022-03-26 MED ORDER — LABETALOL HCL 5 MG/ML IV SOLN: 10.0000 mg | INTRAVENOUS | Status: DC | PRN

## 2022-03-26 SURGICAL SUPPLY — 10 items
BAND ZEPHYR COMPRESS 30 LONG (HEMOSTASIS) IMPLANT
CATH 5FR JL3.5 JR4 ANG PIG MP (CATHETERS) IMPLANT
DRAPE BRACHIAL (DRAPES) IMPLANT
GLIDESHEATH SLEND SS 6F .021 (SHEATH) IMPLANT
GUIDEWIRE INQWIRE 1.5J.035X260 (WIRE) IMPLANT
INQWIRE 1.5J .035X260CM (WIRE) ×1
PACK CARDIAC CATH (CUSTOM PROCEDURE TRAY) ×1 IMPLANT
PROTECTION STATION PRESSURIZED (MISCELLANEOUS) ×1
SET ATX SIMPLICITY (MISCELLANEOUS) IMPLANT
STATION PROTECTION PRESSURIZED (MISCELLANEOUS) IMPLANT

## 2022-03-30 LAB — CARDIAC CATHETERIZATION: Cath EF Quantitative: 55 %

## 2022-03-31 ENCOUNTER — Encounter: Payer: Self-pay | Admitting: Internal Medicine

## 2022-04-12 ENCOUNTER — Emergency Department: Payer: Medicare PPO

## 2022-04-12 ENCOUNTER — Emergency Department
Admission: EM | Admit: 2022-04-12 | Discharge: 2022-04-12 | Disposition: A | Discharge status: Home / Self Care | Payer: Medicare PPO | Attending: Emergency Medicine | Admitting: Emergency Medicine

## 2022-04-12 DIAGNOSIS — J209 Acute bronchitis, unspecified: Secondary | ICD-10-CM | POA: Insufficient documentation

## 2022-04-12 DIAGNOSIS — Z853 Personal history of malignant neoplasm of breast: Secondary | ICD-10-CM | POA: Insufficient documentation

## 2022-04-12 DIAGNOSIS — M79604 Pain in right leg: Secondary | ICD-10-CM | POA: Insufficient documentation

## 2022-04-12 DIAGNOSIS — Z96659 Presence of unspecified artificial knee joint: Secondary | ICD-10-CM | POA: Diagnosis not present

## 2022-04-12 DIAGNOSIS — M79661 Pain in right lower leg: Secondary | ICD-10-CM

## 2022-04-12 MED ORDER — BENZONATATE 100 MG PO CAPS: 100.0000 mg | ORAL_CAPSULE | Freq: Four times a day (QID) | ORAL | 0 refills | Status: DC | PRN

## 2022-04-12 MED ORDER — HYDROCODONE-ACETAMINOPHEN 5-325 MG PO TABS: 1.0000 | ORAL_TABLET | Freq: Every evening | ORAL | 0 refills | Status: DC | PRN

## 2022-04-12 NOTE — ED Notes (Signed)
L calf cool to touch, pedal pulse present.

## 2022-04-12 NOTE — ED Triage Notes (Signed)
Pt presents to the ED via POV due to L leg pain and a cough. Pt states she experiences increasing L leg pain when she walks. Pt has hx DVT 04/2021. Pt denies any recent trauma to the leg. Pt also c/o cough x7 days. Pt A&Ox4

## 2022-04-12 NOTE — ED Provider Notes (Signed)
[facility] Provider Note    Event Date/Time   First MD Initiated Contact with Patient 04/12/22 1423     (approximate)   History   Leg Pain   HPI  Regina Blanchard is a 71 y.o. female with a history of hyperlipidemia previous total knee replacement, breast cancer.  Patient reports a history of DVT in 2022.   Patient woke up this morning and noticed an achiness behind the upper right calf area.  She reports it hurts little bit when if she squeezes the muscle and when she takes a step which feels slightly sore.  The foot itself feels fine no cold or blue foot.  She is able to walk on it.  Reports it is a very mild discomfort.  She does however report that she has had a previous blood clot in the leg and feels that it is important to evaluate for that there is no new blood clot.  She denies any falls or injuries.  She also mentions that for about 10 days she has had a cough, it is occasionally slightly productive.  Her husband had the same thing and his improved, she is slowly getting better but he still had this cough for about 10 days and it is hard for her to sleep at night as that is when it seems to be most prevalent.  There is no chest pain with it.  She reports that the cough she thought she would just mention since its been present for 10 days but does not think the 2 are at all related.  She was previously on anticoagulation for a DVT.  She is no longer  Physical Exam   Triage Vital Signs: ED Triage Vitals  Enc Vitals Group     BP 04/12/22 1322 (!) 137/96     Pulse Rate 04/12/22 1322 93     Resp 04/12/22 1322 20     Temp 04/12/22 1322 98 F (36.7 C)     Temp Source 04/12/22 1322 Oral     SpO2 04/12/22 1322 94 %     Weight 04/12/22 1319 189 lb 9.5 oz (86 kg)     Height 04/12/22 1319 '5\' 8"'$  (1.727 m)     Head Circumference --      Peak Flow --      Pain Score 04/12/22 1319 8     Pain Loc --      Pain Edu? --      Excl. in [place]? --     Most  recent vital signs: Vitals:   04/12/22 1322  BP: (!) 137/96  Pulse: 93  Resp: 20  Temp: 98 F (36.7 C)  SpO2: 94%     General: Awake, no distress.  She is very pleasant.  She shows no distress.  Husband at bedside CV:  Good peripheral perfusion.  Normal heart tones and rate.  No tachycardia Resp:  Normal effort.  Clear lung sounds bilaterally.  No wheezing.  No increased work of breathing but does have an occasional dry cough. Abd:  No distention.  Soft nontender Other:    Lower Extremities  No edema. Normal PT pulses bilateral with good cap refill.  Normal neuro-motor function lower extremities bilateral.  RIGHT Right lower extremity demonstrates normal strength, good use of all muscles but she does report a little bit of achiness when she dorsi and plantar flexes the foot.  No Achilles tenderness or deficit. No edema bruising or contusions of the right hip,  right knee, right ankle. Full range of motion of the right lower extremity just a slight achy pain behind the right proximal gastrocnemius region with no overlying lesions.  Compartments are soft and nontender. No pain on axial loading. No evidence of trauma.  Strong palpable pulse  LEFT  Grossly normal.  Well-perfused left foot.  Palpable posterior tibial  ED Results / Procedures / Treatments   Labs (all labs ordered are listed, but only abnormal results are displayed) Labs Reviewed - No data to display    RADIOLOGY  Chest x-ray interpreted by me as negative for acute finding  Right lower extremity ultrasound negative for DVT.  Chest x-ray negative for acute finding or infiltrate.  Images of the right knee are negative for acute finding   PROCEDURES:  Critical Care performed: No  Procedures   MEDICATIONS ORDERED IN ED: Medications - No data to display   IMPRESSION / MDM / [place] / ED COURSE  I reviewed the triage vital signs and the nursing notes.                               Differential diagnosis includes, but is not limited to, possible muscle strain, Baker's cyst, musculoskeletal cause, hardware malfunction though seemingly unlikely, doubt traumatic injury as no reported fall or other injury, also given clinical history wish to exclude DVT no pretest probability seems somewhat low given the patient has no acute edema venous cords congestion or evidence of would be strongly supportive of DVT.  Also she does report a cough for 10 days.  I suspect this is incidental to her right calf discomfort, but certainly if DVT is present then I would consider possibility for PE but this seems highly unlikely.  She has no associated chest pain.  She reports her husband is also had a dry cough that is now resolving and her symptoms seem to be most suggestive of a mild bronchitis.  Will check chest x-ray to rule out infiltrate  Patient's presentation is most consistent with acute complicated illness / injury requiring diagnostic workup.   Right lower extremity negative for DVT.  Reassuring examination.  At this point suspect likely musculoskeletal in nature.  Discussed careful return precautions with the patient including if she is developed cold blue, numb foot, severe pain fevers swelling etc. she should return for further evaluation right away and she is agreeable.  Also suspect she likely has a mild bronchitis, no infiltrate no indication for antibiotic at this time.  Discussed with patient and we will trial prescription for Tessalon for cough suppression.  Patient will plan to follow-up with Dr. Sabra Heck for both issues, return to the ER in the interim with any acute concerns worsening or other issues arise     FINAL CLINICAL IMPRESSION(S) / ED DIAGNOSES   Final diagnoses:  Right calf pain  Acute bronchitis, unspecified organism     Rx / DC Orders   ED Discharge Orders          Ordered    HYDROcodone-acetaminophen (NORCO/VICODIN) 5-325 MG tablet  At bedtime PRN,    Status:  Discontinued        04/12/22 1636    benzonatate (TESSALON PERLES) 100 MG capsule  Every 6 hours PRN        04/12/22 1645             Note:  This document was prepared using Dragon voice recognition software and  may include unintentional dictation errors.   Delman Kitten, MD 04/12/22 1714

## 2022-05-07 ENCOUNTER — Other Ambulatory Visit: Payer: Self-pay | Admitting: Internal Medicine

## 2022-05-07 DIAGNOSIS — Z1231 Encounter for screening mammogram for malignant neoplasm of breast: Secondary | ICD-10-CM

## 2022-05-12 ENCOUNTER — Encounter: Payer: Self-pay | Admitting: Nurse Practitioner

## 2022-05-12 ENCOUNTER — Inpatient Hospital Stay: Payer: Medicare PPO | Attending: Oncology | Admitting: Nurse Practitioner

## 2022-05-12 ENCOUNTER — Ambulatory Visit: Payer: Medicare PPO | Admitting: Oncology

## 2022-05-12 VITALS — BP 138/79 | HR 76 | Temp 97.3°F | Wt 188.0 lb

## 2022-05-12 DIAGNOSIS — Z79899 Other long term (current) drug therapy: Secondary | ICD-10-CM | POA: Diagnosis not present

## 2022-05-12 DIAGNOSIS — M8580 Other specified disorders of bone density and structure, unspecified site: Secondary | ICD-10-CM | POA: Diagnosis not present

## 2022-05-12 DIAGNOSIS — Z853 Personal history of malignant neoplasm of breast: Secondary | ICD-10-CM | POA: Diagnosis not present

## 2022-05-12 DIAGNOSIS — Z86718 Personal history of other venous thrombosis and embolism: Secondary | ICD-10-CM | POA: Insufficient documentation

## 2022-05-12 DIAGNOSIS — C50411 Malignant neoplasm of upper-outer quadrant of right female breast: Secondary | ICD-10-CM | POA: Diagnosis present

## 2022-05-12 DIAGNOSIS — Z7982 Long term (current) use of aspirin: Secondary | ICD-10-CM | POA: Insufficient documentation

## 2022-05-12 DIAGNOSIS — Z923 Personal history of irradiation: Secondary | ICD-10-CM | POA: Insufficient documentation

## 2022-05-12 DIAGNOSIS — Z79811 Long term (current) use of aromatase inhibitors: Secondary | ICD-10-CM | POA: Diagnosis not present

## 2022-05-12 DIAGNOSIS — C50911 Malignant neoplasm of unspecified site of right female breast: Secondary | ICD-10-CM

## 2022-05-12 DIAGNOSIS — Z170 Estrogen receptor positive status [ER+]: Secondary | ICD-10-CM | POA: Insufficient documentation

## 2022-05-12 DIAGNOSIS — I10 Essential (primary) hypertension: Secondary | ICD-10-CM | POA: Diagnosis not present

## 2022-05-12 DIAGNOSIS — Z5181 Encounter for therapeutic drug level monitoring: Secondary | ICD-10-CM

## 2022-05-12 DIAGNOSIS — Z08 Encounter for follow-up examination after completed treatment for malignant neoplasm: Secondary | ICD-10-CM

## 2022-05-12 NOTE — Progress Notes (Signed)
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  INTERVAL HISTORY: Patient returns to clinic today for further evaluation and continued surveillance. She continues letrozole. She reports occasional hot flashes at night that are not bothersome, but otherwise feels well.  She has no neurologic complaints.  She denies any recent fevers or illnesses.  She has a good appetite and denies weight loss.  She has no chest pain, shortness of breath, cough, or hemoptysis.  She denies any nausea, vomiting, constipation, or diarrhea.  She has no urinary complaints.  Patient offers no further specific complaints today.  REVIEW OF SYSTEMS:   Review of Systems  Constitutional: Negative.  Negative for fever, malaise/fatigue and weight loss.  Respiratory: Negative.  Negative for cough and shortness of breath.   Cardiovascular: Negative.  Negative for chest pain and leg swelling.  Gastrointestinal: Negative.  Negative for abdominal pain, blood in stool and melena.  Genitourinary:  Negative for dysuria.  Musculoskeletal: Negative.  Negative for back pain.  Skin: Negative.  Negative for rash.  Neurological:  Positive for sensory change. Negative for dizziness, focal weakness, weakness and headaches.  Psychiatric/Behavioral: Negative.  The patient is not nervous/anxious.   As per HPI. Otherwise, a complete review of systems is negative.  PAST MEDICAL HISTORY: Past Medical History:  Diagnosis Date   Back pain    Breast cancer ([place]) 2004   right breast cancer   Cervical polyp    Deep vein thrombosis  (DVT) of popliteal vein of right lower extremity ([place]) 05/24/2021   Degenerative arthritis of right knee    Diverticulosis    Family history of adverse reaction to anesthesia    brother became combative in post op   Hypertension    Pneumonia    as a child "drank kerosine when a baby"   PONV (postoperative nausea and vomiting)    x1 nausea only   Status post radiation therapy    2004 breast cancer   Vitamin D deficiency     PAST SURGICAL HISTORY: Past Surgical History:  Procedure Laterality Date   BREAST BIOPSY Left    1991 negative 2006 negative   BREAST BIOPSY Bilateral    BREAST BIOPSY  04/29/2021   X2  12:00 heart-fat necrosis, 12:30 coil-[facility]   BREAST EXCISIONAL BIOPSY Right    positive 2004    BREAST LUMPECTOMY Right 2004   BREAST LUMPECTOMY,RADIO FREQ LOCALIZER,AXILLARY SENTINEL LYMPH NODE BIOPSY Right 05/22/2021   Procedure: Re-excision BREAST LUMPECTOMY,RADIO FREQ LOCALIZER,AXILLARY SENTINEL LYMPH NODE BIOPSY;  Surgeon: Benjamine Sprague, DO;  Location: [facility] ORS;  Service: General;  Laterality: Right;   COLONOSCOPY     2008, 2011, 2022   [place]   infertility   KNEE ARTHROPLASTY Right 07/06/2018   Procedure: COMPUTER ASSISTED TOTAL KNEE ARTHROPLASTY;  Surgeon: Dereck Leep, MD;  Location: [facility] ORS;  Service: Orthopedics;  Laterality: Right;   KNEE ARTHROPLASTY Left 02/10/2021   Procedure: COMPUTER ASSISTED TOTAL KNEE ARTHROPLASTY;  Surgeon: Dereck Leep, MD;  Location: [facility] ORS;  Service: Orthopedics;  Laterality: Left;   LEFT HEART CATH AND CORONARY ANGIOGRAPHY  Left 03/26/2022   Procedure: LEFT HEART CATH AND CORONARY ANGIOGRAPHY;  Surgeon: Yolonda Kida, MD;  Location: [facility];  Service: Cardiovascular;  Laterality: Left;   SQUINT REPAIR Left 1960   age 79 (CROSS EYE SURGERY)   TOTAL MASTECTOMY Right 07/29/2021   Procedure: TOTAL MASTECTOMY;  Surgeon: Benjamine Sprague, DO;  Location: [facility] OR[facility];   Service: General;  Laterality: Right;    FAMILY HISTORY: Family History  Problem Relation Age of Onset   Breast cancer Maternal Aunt 22   Diabetes Mother    Heart disease Father    Diabetes Sister    Breast cancer Paternal Aunt    Colon cancer Maternal Aunt    Hypertension Maternal Aunt     ADVANCED DIRECTIVES (Y/N):  N  HEALTH MAINTENANCE: Social History   Tobacco Use   Smoking status: Never   Smokeless tobacco: Never  Vaping Use   Vaping Use: Never used  Substance Use Topics   Alcohol use: No   Drug use: No     Colonoscopy:  PAP:  Bone density:  Lipid panel:  No Known Allergies  Current Outpatient Medications  Medication Sig Dispense Refill   ALPRAZolam (XANAX) 0.25 MG tablet Take 0.25 mg by mouth daily as needed for anxiety or sleep.      aspirin EC 325 MG tablet Take 325 mg by mouth daily.     baclofen (LIORESAL) 10 MG tablet Take 10 mg by mouth daily as needed for muscle spasms.      benzonatate (TESSALON PERLES) 100 MG capsule Take 1 capsule (100 mg total) by mouth every 6 (six) hours as needed for cough. 20 capsule 0   Cholecalciferol (VITAMIN D) 50 MCG (2000 UT) CAPS Take 6,000 Units by mouth daily.     letrozole (FEMARA) 2.5 MG tablet Take 1 tablet (2.5 mg total) by mouth daily. 90 tablet 3   olmesartan-hydrochlorothiazide (BENICAR HCT) 20-12.5 MG tablet Take 1 tablet by mouth daily.     rosuvastatin (CRESTOR) 40 MG tablet Take 20 mg by mouth.     amoxicillin (AMOXIL) 500 MG capsule SMARTSIG:4 Capsule(s) By Mouth (Patient not taking: Reported on 11/11/2021)     fluticasone (FLONASE) 50 MCG/ACT nasal spray Place 1 spray into both nostrils in the morning and at bedtime. (Patient not taking: Reported on 08/12/2021)     Multiple Vitamin (MULTIVITAMIN) capsule Take 3 capsules by mouth daily. Balance of nature (Patient not taking: Reported on 03/26/2022)     ondansetron (ZOFRAN-ODT) 4 MG disintegrating tablet Take 4 mg by mouth every 8 (eight) hours as needed for  nausea/vomiting, vomiting or nausea. (Patient not taking: Reported on 11/11/2021)     promethazine-dextromethorphan (PROMETHAZINE-DM) 6.25-15 MG/5ML syrup Take 5 mLs by mouth every 6 (six) hours as needed for cough. (Patient not taking: Reported on 11/11/2021)     tiZANidine (ZANAFLEX) 4 MG tablet Take 4 mg by mouth 2 (two) times daily as needed for muscle spasms. (Patient not taking: Reported on 11/11/2021)     No current facility-administered medications for this visit.    OBJECTIVE: Vitals:   05/12/22 1418  BP: 138/79  Pulse: 76  Temp: (!) 97.3 F (36.3 C)  SpO2: 100%     Body mass index is 28.59 kg/m.    ECOG FS:0 - Asymptomatic  General: Well-developed, well-nourished, no acute distress. Eyes: Pink conjunctiva, anicteric sclera. HEENT: Normocephalic, moist mucous membranes. Breast: Right mastectomy.  Exam deferred today. Lungs: No audible wheezing or coughing. Heart: Regular rate and rhythm. Abdomen:  Soft, nontender, no obvious distention. Musculoskeletal: No edema, cyanosis, or clubbing. Neuro: Alert, answering all questions appropriately. Cranial nerves grossly intact. Skin: No rashes or petechiae noted. Psych: Normal affect.  LAB RESULTS:  Lab Results  Component Value Date   NA 143 07/23/2021   K 4.0 07/23/2021   CL 104 07/23/2021   CO2 30 07/23/2021   GLUCOSE 98 07/23/2021   BUN 12 07/23/2021   CREATININE 0.54 07/23/2021   CALCIUM 9.6 07/23/2021   PROT 7.2 06/22/2018   ALBUMIN 4.1 06/22/2018   AST 25 06/22/2018   ALT 25 06/22/2018   ALKPHOS 90 06/22/2018   BILITOT 1.0 06/22/2018   GFRNONAA >60 07/23/2021   GFRAA >60 06/22/2018    Lab Results  Component Value Date   WBC 4.3 07/23/2021   NEUTROABS 1.9 12/31/2011   HGB 11.9 (L) 07/23/2021   HCT 37.8 07/23/2021   MCV 90.6 07/23/2021   PLT 232 07/23/2021     STUDIES: US Venous Img Lower Unilateral Right  Result Date: 04/12/2022 CLINICAL DATA:  Pain behind right knee EXAM: RIGHT LOWER EXTREMITY  VENOUS DOPPLER ULTRASOUND TECHNIQUE: Gray-scale sonography with compression, as well as color and duplex ultrasound, were performed to evaluate the deep venous system(s) from the level of the common femoral vein through the popliteal and proximal calf veins. COMPARISON:  None Available. FINDINGS: VENOUS Normal compressibility of the common femoral, superficial femoral, and popliteal veins, as well as the visualized calf veins. Visualized portions of profunda femoral vein and great saphenous vein unremarkable. No filling defects to suggest DVT on grayscale or color Doppler imaging. Doppler waveforms show normal direction of venous flow, normal respiratory plasticity and response to augmentation. Limited views of the contralateral common femoral vein are unremarkable. OTHER None. Limitations: none IMPRESSION: Negative. Electronically Signed   By: Dorise Bullion III M.D.   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  Oncotype DX score low risk of 18.  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  She then waited until Eliquis was discontinued to have her total mastectomy which occurred on July 29, 2021.  She has a low risk Oncotype score, therefore adjuvant chemotherapy was not needed.  Because she underwent mastectomy, she did not require adjuvant XRT.  Continue letrozole for at least  5 years (March 2028) but may consider extended AI therapy.  Return to clinic in 6 months for routine evaluation.   Genetic testing: referral sent today.  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 Osteopenia: Patient underwent baseline bone mineral density on August 13, 2021 which revealed a T score of -1.7.  Recommended calcium and vitamin D supplementation along with  weight bearing exercise as toelrated.  Repeat in March 2024.  Disposition: Bone density March 2024 6 mo- see Dr Grayland Ormond or APP for breast cancer surveillance- [place]  I spent a total of 30 minutes reviewing chart data, face-to-face evaluation with the patient, counseling and coordination of care as detailed above.  Patient expressed understanding and was in agreement with this plan. She also understands that She can call clinic at any time with any questions, concerns, or complaints.    Cancer Staging  Invasive ductal carcinoma of right breast [facility]) Staging form: Breast, AJCC 8th Edition - Pathologic stage from 06/06/2021: Stage IA (  pT1b, pN0, cM0, G2, ER+, PR+, HER2-) - Signed by Lloyd Huger, MD on 06/06/2021 Stage prefix: Initial diagnosis Histologic grading system: 3 grade system  Verlon Au, NP   05/12/2022

## 2022-05-13 ENCOUNTER — Ambulatory Visit: Payer: Medicare PPO | Admitting: Oncology

## 2022-05-18 ENCOUNTER — Observation Stay
Admission: EM | Admit: 2022-05-18 | Discharge: 2022-05-19 | Disposition: A | Discharge status: Home / Self Care | Payer: Medicare PPO | Attending: Nurse Practitioner | Admitting: Nurse Practitioner

## 2022-05-18 ENCOUNTER — Emergency Department: Payer: Medicare PPO

## 2022-05-18 ENCOUNTER — Other Ambulatory Visit: Payer: Self-pay

## 2022-05-18 DIAGNOSIS — W5501XA Bitten by cat, initial encounter: Secondary | ICD-10-CM

## 2022-05-18 DIAGNOSIS — N6011 Diffuse cystic mastopathy of right breast: Secondary | ICD-10-CM | POA: Insufficient documentation

## 2022-05-18 DIAGNOSIS — Z79899 Other long term (current) drug therapy: Secondary | ICD-10-CM | POA: Insufficient documentation

## 2022-05-18 DIAGNOSIS — Z7982 Long term (current) use of aspirin: Secondary | ICD-10-CM | POA: Insufficient documentation

## 2022-05-18 DIAGNOSIS — I10 Essential (primary) hypertension: Secondary | ICD-10-CM | POA: Insufficient documentation

## 2022-05-18 DIAGNOSIS — D0511 Intraductal carcinoma in situ of right breast: Secondary | ICD-10-CM | POA: Diagnosis not present

## 2022-05-18 DIAGNOSIS — Z96651 Presence of right artificial knee joint: Secondary | ICD-10-CM | POA: Diagnosis not present

## 2022-05-18 DIAGNOSIS — R7989 Other specified abnormal findings of blood chemistry: Secondary | ICD-10-CM | POA: Insufficient documentation

## 2022-05-18 DIAGNOSIS — C50911 Malignant neoplasm of unspecified site of right female breast: Secondary | ICD-10-CM | POA: Diagnosis present

## 2022-05-18 DIAGNOSIS — E782 Mixed hyperlipidemia: Secondary | ICD-10-CM | POA: Diagnosis present

## 2022-05-18 DIAGNOSIS — Z86718 Personal history of other venous thrombosis and embolism: Secondary | ICD-10-CM | POA: Insufficient documentation

## 2022-05-18 DIAGNOSIS — N6019 Diffuse cystic mastopathy of unspecified breast: Secondary | ICD-10-CM | POA: Diagnosis present

## 2022-05-18 DIAGNOSIS — L03116 Cellulitis of left lower limb: Secondary | ICD-10-CM | POA: Diagnosis not present

## 2022-05-18 LAB — CBC WITH DIFFERENTIAL/PLATELET
Abs Immature Granulocytes: 0.02 10*3/uL (ref 0.00–0.07)
Basophils Absolute: 0 10*3/uL (ref 0.0–0.1)
Basophils Relative: 0 %
Eosinophils Absolute: 0.2 10*3/uL (ref 0.0–0.5)
Eosinophils Relative: 3 %
HCT: 39.2 % (ref 36.0–46.0)
Hemoglobin: 12.4 g/dL (ref 12.0–15.0)
Immature Granulocytes: 0 %
Lymphocytes Relative: 16 %
Lymphs Abs: 1 10*3/uL (ref 0.7–4.0)
MCH: 29.5 pg (ref 26.0–34.0)
MCHC: 31.6 g/dL (ref 30.0–36.0)
MCV: 93.3 fL (ref 80.0–100.0)
Monocytes Absolute: 0.8 10*3/uL (ref 0.1–1.0)
Monocytes Relative: 14 %
Neutro Abs: 4.1 10*3/uL (ref 1.7–7.7)
Neutrophils Relative %: 67 %
Platelets: 180 10*3/uL (ref 150–400)
RBC: 4.2 MIL/uL (ref 3.87–5.11)
RDW: 13.4 % (ref 11.5–15.5)
WBC: 6.2 10*3/uL (ref 4.0–10.5)
nRBC: 0 % (ref 0.0–0.2)

## 2022-05-18 LAB — COMPREHENSIVE METABOLIC PANEL
ALT: 60 U/L — ABNORMAL HIGH (ref 0–44)
AST: 58 U/L — ABNORMAL HIGH (ref 15–41)
Albumin: 3.9 g/dL (ref 3.5–5.0)
Alkaline Phosphatase: 119 U/L (ref 38–126)
Anion gap: 8 (ref 5–15)
BUN: 15 mg/dL (ref 8–23)
CO2: 24 mmol/L (ref 22–32)
Calcium: 9.1 mg/dL (ref 8.9–10.3)
Chloride: 108 mmol/L (ref 98–111)
Creatinine, Ser: 0.72 mg/dL (ref 0.44–1.00)
GFR, Estimated: >60 mL/min (ref >60)
Glucose, Bld: 133 mg/dL — ABNORMAL HIGH (ref 70–99)
Potassium: 3.6 mmol/L (ref 3.5–5.1)
Sodium: 140 mmol/L (ref 135–145)
Total Bilirubin: 0.9 mg/dL (ref 0.3–1.2)
Total Protein: 7.1 g/dL (ref 6.5–8.1)

## 2022-05-18 MED ORDER — SODIUM CHLORIDE 0.9 % IV BOLUS
1000.0000 mL | Freq: Once | INTRAVENOUS | Status: AC
  Administered 2022-05-18: 1000 mL via INTRAVENOUS

## 2022-05-18 MED ORDER — SODIUM CHLORIDE 0.9 % IV SOLN
3.0000 g | Freq: Once | INTRAVENOUS | Status: AC
  Administered 2022-05-18: 3 g via INTRAVENOUS
  Filled 2022-05-18: qty 8

## 2022-05-18 MED ORDER — ACETAMINOPHEN 325 MG RE SUPP: 650.0000 mg | Freq: Four times a day (QID) | RECTAL | Status: DC | PRN

## 2022-05-18 MED ORDER — VANCOMYCIN HCL 2000 MG/400ML IV SOLN
2000.0000 mg | Freq: Once | INTRAVENOUS | Status: AC
  Administered 2022-05-18: 2000 mg via INTRAVENOUS
  Filled 2022-05-18: qty 400

## 2022-05-18 MED ORDER — SODIUM CHLORIDE 0.9 % IV SOLN
3.0000 g | Freq: Four times a day (QID) | INTRAVENOUS | Status: DC
  Administered 2022-05-19 (×3): 3 g via INTRAVENOUS
  Filled 2022-05-18 (×3): qty 8

## 2022-05-18 MED ORDER — ONDANSETRON HCL 4 MG/2ML IJ SOLN: 4.0000 mg | Freq: Four times a day (QID) | INTRAMUSCULAR | Status: DC | PRN

## 2022-05-18 MED ORDER — SENNOSIDES-DOCUSATE SODIUM 8.6-50 MG PO TABS: 1.0000 | ORAL_TABLET | Freq: Every evening | ORAL | Status: DC | PRN

## 2022-05-18 MED ORDER — ACETAMINOPHEN 325 MG PO TABS
650.0000 mg | ORAL_TABLET | Freq: Four times a day (QID) | ORAL | Status: DC | PRN
  Administered 2022-05-19: 650 mg via ORAL
  Filled 2022-05-18: qty 2

## 2022-05-18 MED ORDER — ONDANSETRON HCL 4 MG PO TABS: 4.0000 mg | ORAL_TABLET | Freq: Four times a day (QID) | ORAL | Status: DC | PRN

## 2022-05-18 MED ORDER — ENOXAPARIN SODIUM 40 MG/0.4ML IJ SOSY
40.0000 mg | PREFILLED_SYRINGE | INTRAMUSCULAR | Status: DC
  Administered 2022-05-18: 40 mg via SUBCUTANEOUS
  Filled 2022-05-18: qty 0.4

## 2022-05-18 MED ORDER — VANCOMYCIN HCL 2000 MG/400ML IV SOLN
2000.0000 mg | INTRAVENOUS | Status: DC
  Filled 2022-05-18: qty 400

## 2022-05-18 NOTE — Consult Note (Signed)
Pharmacy Antibiotic Note  Regina Blanchard is a 71 y.o. female admitted on 05/18/2022 with cellulitis.  Pharmacy has been consulted for vancomycin dosing.  Plan: Pt received vancomycin 2000 mg x 1 in the ED. Will order vancomycin 2000 mg q24H. Predicted AUC of 529. Goal AUC 400-600. Scr 0.8, IBW, Vd 0.72. Plan to order vancomycin level after 4th or 5th dose.   Height: '5\' 8"'$  (172.7 cm) Weight: 87.5 kg (193 lb) IBW/kg (Calculated) : 63.9  Temp (24hrs), Avg:98.7 F (37.1 C), Min:98.7 F (37.1 C), Max:98.7 F (37.1 C)  Recent Labs  Lab 05/18/22 1613 05/18/22 1801  WBC  --  6.2  CREATININE 0.72  --     Estimated Creatinine Clearance: 76.8 mL/min (by C-G formula based on SCr of 0.72 mg/dL).    No Known Allergies  Antimicrobials this admission: 12/25 vancomycin >>   12/25 Unasyn x 1  Dose adjustments this admission: None  Microbiology results: None  Thank you for allowing pharmacy to be a part of this patient's care.  Oswald Hillock, PharmD, BCPS 05/18/2022 6:48 PM

## 2022-05-18 NOTE — Consult Note (Signed)
Pharmacy Antibiotic Note  Regina Blanchard is a 71 y.o. female admitted on 05/18/2022 with cellulitis.  Pharmacy has been consulted for vancomycin dosing.  Pharmacy now consulted for Unasyn dosing.  Plan: Unasyn 3 gm q6h per indication and renal fxn.  Pt received vancomycin 2000 mg x 1 in the ED. Will order vancomycin 2000 mg q24H. Predicted AUC of 529. Goal AUC 400-600. Scr 0.8, IBW, Vd 0.72. Plan to order vancomycin level after 4th or 5th dose.   Height: '5\' 8"'$  (172.7 cm) Weight: 87.5 kg (193 lb) IBW/kg (Calculated) : 63.9  Temp (24hrs), Avg:98.7 F (37.1 C), Min:98.7 F (37.1 C), Max:98.7 F (37.1 C)  Recent Labs  Lab 05/18/22 1613 05/18/22 1801  WBC  --  6.2  CREATININE 0.72  --      Estimated Creatinine Clearance: 76.8 mL/min (by C-G formula based on SCr of 0.72 mg/dL).    No Known Allergies  Antimicrobials this admission: 12/25 vancomycin >>  12/25 Unasyn >>   Dose adjustments this admission: None  Microbiology results: 12/26 BCx: Pending  Thank you for allowing pharmacy to be a part of this patient's care.  Renda Rolls, PharmD, [facility] 05/18/2022 10:46 PM

## 2022-05-18 NOTE — Consult Note (Signed)
PHARMACY -  BRIEF ANTIBIOTIC NOTE   Pharmacy has received consult(s) for cellulitis from an ED provider.  The patient's profile has been reviewed for ht/wt/allergies/indication/available labs.    One time order(s) placed for vancomycin  Further antibiotics/pharmacy consults should be ordered by admitting physician if indicated.                       Thank you, Oswald Hillock 05/18/2022  5:41 PM

## 2022-05-18 NOTE — ED Provider Notes (Signed)
[facility] Provider Note  Patient Contact: 6:33 PM (approximate)   History   Leg Swelling (Left)   HPI  Regina Blanchard is a 71 y.o. female presents to the emergency department with cellulitis surrounding along lower ankle.  Patient was bitten by her own cat who is up-to-date on rabies vaccine series.  Patient has noticed some erythema surrounding her right ankle and some erythema along the superior lateral aspect of her left calf.  Patient is status post total knee replacement on the left and wanted to be seen as soon as possible as she wants to avoid a septic knee.  She had some fever and chills at home.  No nausea, vomiting or abdominal pain.  Patient is still been able to ambulate.      Physical Exam   Triage Vital Signs: ED Triage Vitals  Enc Vitals Group     BP 05/18/22 1604 (!) 142/120     Pulse Rate 05/18/22 1604 (!) 102     Resp 05/18/22 1604 17     Temp 05/18/22 1604 98.7 F (37.1 C)     Temp Source 05/18/22 1604 Oral     SpO2 05/18/22 1604 98 %     Weight 05/18/22 1605 193 lb (87.5 kg)     Height 05/18/22 1605 '5\' 8"'$  (1.727 m)     Head Circumference --      Peak Flow --      Pain Score 05/18/22 1604 0     Pain Loc --      Pain Edu? --      Excl. in [place]? --     Most recent vital signs: Vitals:   05/18/22 1604  BP: (!) 142/120  Pulse: (!) 102  Resp: 17  Temp: 98.7 F (37.1 C)  SpO2: 98%     General: Alert and in no acute distress. Eyes:  PERRL. EOMI. Head: No acute traumatic findings ENT:      Nose: No congestion/rhinnorhea.      Mouth/Throat: Mucous membranes are moist. Neck: No stridor. No cervical spine tenderness to palpation. Cardiovascular:  Good peripheral perfusion Respiratory: Normal respiratory effort without tachypnea or retractions. Lungs CTAB. Good air entry to the bases with no decreased or absent breath sounds. Gastrointestinal: Bowel sounds 4 quadrants. Soft and nontender to palpation. No guarding or  rigidity. No palpable masses. No distention. No CVA tenderness. Musculoskeletal: Full range of motion to all extremities.  Neurologic:  No gross focal neurologic deficits are appreciated.  Skin: Patient has erythema surrounding puncture wounds along the left ankle.  Erythema extends to ankle.  Patient also has erythema along the lateral aspect of the left calf just below the knee.  Palpable dorsalis pedis pulse bilaterally and symmetrically. Other:   ED Results / Procedures / Treatments   Labs (all labs ordered are listed, but only abnormal results are displayed) Labs Reviewed  COMPREHENSIVE METABOLIC PANEL - Abnormal; Notable for the following components:      Result Value   Glucose, Bld 133 (*)    AST 58 (*)    ALT 60 (*)    All other components within normal limits  CULTURE, BLOOD (ROUTINE X 2)  CULTURE, BLOOD (ROUTINE X 2)  CBC WITH DIFFERENTIAL/PLATELET  CBC WITH DIFFERENTIAL/PLATELET       RADIOLOGY  I personally viewed and evaluated these images as part of my medical decision making, as well as reviewing the written report by the radiologist.  ED Provider Interpretation: X-ray of the  left tibia/fibula unremarkable.  X-ray of the left knee shows no acute abnormality   PROCEDURES:  Critical Care performed: No  Procedures   MEDICATIONS ORDERED IN ED: Medications  vancomycin (VANCOREADY) IVPB 2000 mg/400 mL (has no administration in time range)  Ampicillin-Sulbactam (UNASYN) 3 g in sodium chloride 0.9 % 100 mL IVPB (0 g Intravenous Stopped 05/18/22 1829)  sodium chloride 0.9 % bolus 1,000 mL (1,000 mLs Intravenous New Bag/Given 05/18/22 1756)     IMPRESSION / MDM / ASSESSMENT AND PLAN / ED COURSE  I reviewed the triage vital signs and the nursing notes.                              Assessment and plan Cat bite Cellulitis 71 year old female presents to the emergency department with erythema of the left ankle and left calf following a cat bite from her own  pet.  Animal is up-to-date on rabies vaccine series.  Patient had some fever and chills at home.  Patient was mildly tachycardic at triage and vital signs were otherwise reassuring.  White blood cell count within range.  Patient does have an elevated AST and ALT which is atypical from her baseline.  X-rays of the left tibia/fibula and left knee show no acute abnormalities.  Patient was given vancomycin and the emergency department as well as a normal saline bolus and admitted under the care of of Dr. Tobie Poet     FINAL CLINICAL IMPRESSION(S) / ED DIAGNOSES   Final diagnoses:  Cat bite, initial encounter  Cellulitis of left lower extremity     Rx / DC Orders   ED Discharge Orders     None        Note:  This document was prepared using Dragon voice recognition software and may include unintentional dictation errors.   Vallarie Mare [place], PA-C 05/18/22 1840    Vanessa Sibley, MD 05/18/22 2121

## 2022-05-18 NOTE — H&P (Signed)
History and Physical   Regina Blanchard MGQ:[id number] DOB: 1951/01/12 DOA: 05/18/2022  PCP: Rusty Aus, MD (Confirm with patient/family/NH records and if not entered, this has to be entered at [facility] point of entry) Outpatient Specialists: *** [facility] speciality and name if known) Patient coming from: ***  I have personally briefly reviewed patient's old medical records in [facility].  Chief Concern: cat bite  HPI: No notes on file  Friday, she was putting her left leg over the bed and her cat attacked her leg. On Saturday, she felt subjective fever and chills. She states her fever was about 99.7 at home. She was Ola Spurr prescribed her tamiflu and she took tylenol and this improved her fever.   She noticed on 'Sunday, she noticed redness.   She endorses nausea and denies vomiting.   Social history: She lives with her husband. She denies tobacco, etoh, and recreational drugs. She is retired and formerly was an RN for 43 years.   ROS:*** Constitutional: no weight change, no fever ENT/Mouth: no sore throat, no rhinorrhea Eyes: no eye pain, no vision changes Cardiovascular: no chest pain, no dyspnea,  no edema, no palpitations Respiratory: no cough, no sputum, no wheezing Gastrointestinal: no nausea, no vomiting, no diarrhea, no constipation Genitourinary: no urinary incontinence, no dysuria, no hematuria Musculoskeletal: no arthralgias, no myalgias Skin: no skin lesions, no pruritus, Neuro: + weakness, no loss of consciousness, no syncope Psych: no anxiety, no depression, + decrease appetite Heme/Lymph: no bruising, no bleeding  ED Course: ***  Assessment/Plan  Principal Problem:   Left leg cellulitis Active Problems:   Fibrocystic breast disease   Hyperlipidemia, mixed   Status post total right knee replacement    Assessment and Plan: No notes have been filed under this hospital service. Service: Hospitalist     *** Chart reviewed.   DVT prophylaxis:  ***  Code Status: ***  Diet: *** Family Communication: ***  Disposition Plan: ***  Consults called: ***  Admission status: ***   Past Medical History:  Diagnosis Date   Back pain    Breast cancer (HCC) 2004   right breast cancer   Cervical polyp    Deep vein thrombosis (DVT) of popliteal vein of right lower extremity (HCC) 05/24/2021   Degenerative arthritis of right knee    Diverticulosis    Family history of adverse reaction to anesthesia    brother became combative in post op   Hypertension    Pneumonia    as a child "drank kerosine when a baby"   PONV (postoperative nausea and vomiting)    x1 nausea only   Status post radiation therapy    2004 breast cancer   Vitamin D deficiency     Past Surgical History:  Procedure Laterality Date   BREAST BIOPSY Left    1991 negative 2006 negative   BREAST BIOPSY Bilateral    BREAST BIOPSY  04/29/2021   X2  12:00 heart-fat necrosis, 12:30 coil-[facility]   BREAST EXCISIONAL BIOPSY Right    positive 2004    BREAST LUMPECTOMY Right 2004   BREAST LUMPECTOMY,RADIO FREQ LOCALIZER,AXILLARY SENTINEL LYMPH NODE BIOPSY Right 05/22/2021   Procedure: Re-excision BREAST LUMPECTOMY,RADIO FREQ LOCALIZER,AXILLARY SENTINEL LYMPH NODE BIOPSY;  Surgeon: Sakai, Isami, DO;  Location: [facility] ORS;  Service: General;  Laterality: Right;   COLONOSCOPY     20'$ 08, 2011, 2022   [place]   infertility   KNEE ARTHROPLASTY Right 07/06/2018  Procedure: COMPUTER ASSISTED TOTAL KNEE ARTHROPLASTY;  Surgeon: Dereck Leep, MD;  Location: [facility] ORS;  Service: Orthopedics;  Laterality: Right;   KNEE ARTHROPLASTY Left 02/10/2021   Procedure: COMPUTER ASSISTED TOTAL KNEE ARTHROPLASTY;  Surgeon: Dereck Leep, MD;  Location: [facility] ORS;  Service: Orthopedics;  Laterality: Left;   LEFT HEART CATH AND CORONARY ANGIOGRAPHY Left 03/26/2022   Procedure: LEFT HEART CATH AND CORONARY ANGIOGRAPHY;  Surgeon: Yolonda Kida, MD;  Location: [facility];  Service: Cardiovascular;  Laterality: Left;   SQUINT REPAIR Left 1960   age 71 (CROSS EYE SURGERY)   TOTAL MASTECTOMY Right 07/29/2021   Procedure: TOTAL MASTECTOMY;  Surgeon: Benjamine Sprague, DO;  Location: [facility] ORS;  Service: General;  Laterality: Right;    Social History:  reports that she has never smoked. She has never used smokeless tobacco. She reports that she does not drink alcohol and does not use drugs.  No Known Allergies Family History  Problem Relation Age of Onset   Breast cancer Maternal Aunt 15   Diabetes Mother    Heart disease Father    Diabetes Sister    Breast cancer Paternal Aunt    Colon cancer Maternal Aunt    Hypertension Maternal Aunt    Family history: Family history reviewed and not pertinent***  Prior to Admission medications   Medication Sig Start Date End Date Taking? Authorizing Provider  oseltamivir (TAMIFLU) 75 MG capsule Take 75 mg by mouth 2 (two) times daily. Take 1 capsule (75 mg total) by mouth 2 (two) times daily for 5 days 05/16/22 05/21/22 Yes [provider]  ALPRAZolam Duanne Moron) 0.25 MG tablet Take 0.25 mg by mouth daily as needed for anxiety or sleep.  04/03/16   [provider]  amoxicillin (AMOXIL) 500 MG capsule SMARTSIG:4 Capsule(s) By Mouth Patient not taking: Reported on 11/11/2021 10/07/21   [provider]  aspirin EC 325 MG tablet Take 325 mg by mouth daily.    [provider]  baclofen (LIORESAL) 10 MG tablet Take 10 mg by mouth daily as needed for muscle spasms.  01/20/17   [provider]  benzonatate (TESSALON PERLES) 100 MG capsule Take 1 capsule (100 mg total) by mouth every 6 (six) hours as needed for cough. 04/12/22   Delman Kitten, MD  Cholecalciferol (VITAMIN D) 50 MCG (2000 UT) CAPS Take 6,000 Units by mouth daily.    [provider]  fluticasone (FLONASE) 50 MCG/ACT nasal spray Place 1 spray into both nostrils in the morning and at  bedtime. Patient not taking: Reported on 08/12/2021 07/20/21   [provider]  letrozole (FEMARA) 2.5 MG tablet Take 1 tablet (2.5 mg total) by mouth daily. 08/12/21   Lloyd Huger, MD  Multiple Vitamin (MULTIVITAMIN) capsule Take 3 capsules by mouth daily. Balance of nature Patient not taking: Reported on 03/26/2022    [provider]  olmesartan-hydrochlorothiazide (BENICAR HCT) 20-12.5 MG tablet Take 1 tablet by mouth daily. 06/02/21 06/02/22  [provider]  ondansetron (ZOFRAN-ODT) 4 MG disintegrating tablet Take 4 mg by mouth every 8 (eight) hours as needed for nausea/vomiting, vomiting or nausea. Patient not taking: Reported on 11/11/2021 01/03/21   [provider]  promethazine-dextromethorphan (PROMETHAZINE-DM) 6.25-15 MG/5ML syrup Take 5 mLs by mouth every 6 (six) hours as needed for cough. Patient not taking: Reported on 11/11/2021 07/20/21   [provider]  rosuvastatin (CRESTOR) 40 MG tablet Take 20 mg by mouth. 03/30/22   [provider]  tiZANidine (  ZANAFLEX) 4 MG tablet Take 4 mg by mouth 2 (two) times daily as needed for muscle spasms. Patient not taking: Reported on 11/11/2021    [provider]    Physical Exam: Vitals:   05/18/22 1604 05/18/22 1605  BP: (!) 142/120   Pulse: (!) 102   Resp: 17   Temp: 98.7 F (37.1 C)   TempSrc: Oral   SpO2: 98%   Weight:  87.5 kg  Height:  '5\' 8"'$  (1.727 m)   Constitutional: appears ***, NAD, calm, comfortable Eyes: PERRL, lids and conjunctivae normal ENMT: Mucous membranes are moist. Posterior pharynx clear of any exudate or lesions. Age-appropriate dentition. Hearing appropriate/loss*** Neck: normal, supple, no masses, no thyromegaly Respiratory: clear to auscultation bilaterally, no wheezing, no crackles. Normal respiratory effort. No accessory muscle use.  Cardiovascular: Regular rate and rhythm, no murmurs / rubs / gallops. No extremity edema. 2+ pedal pulses. No  carotid bruits.  Abdomen: no tenderness, no masses palpated, no hepatosplenomegaly. Bowel sounds positive.  Musculoskeletal: no clubbing / cyanosis. No joint deformity upper and lower extremities. Good ROM, no contractures, no atrophy. Normal muscle tone.  Skin: no rashes, lesions, ulcers. No induration Neurologic: Sensation intact. Strength 5/5 in all 4.  Psychiatric: Normal judgment and insight. Alert and oriented x 3. Normal mood.   EKG: independently reviewed, showing ***  Chest x-ray on Admission: I personally reviewed and I agree*** with radiologist reading as below.  DG Tibia/Fibula Left  Result Date: 05/18/2022 CLINICAL DATA:  Cellulitis EXAM: LEFT TIBIA AND FIBULA - 2 VIEW COMPARISON:  None Available. FINDINGS: There is no evidence of fracture or other focal bone lesions. Nonspecific soft tissue swelling identified at the ankle level. IMPRESSION: Ankle soft tissue swelling.  No osseous abnormalities. Electronically Signed   By: Sammie Bench M.D.   On: 05/18/2022 17:53   DG Knee Complete 4 Views Left  Result Date: 05/18/2022 CLINICAL DATA:  Cellulitis EXAM: LEFT KNEE - COMPLETE 4 VIEW COMPARISON:  None Available. FINDINGS: No acute fracture, dislocation or subluxation. Status post total knee arthroplasty. No periprosthetic lucency to suggest loosening. No effusion. Unremarkable appearance of soft tissues. IMPRESSION: Postop changes total knee arthroplasty.  No acute findings. Electronically Signed   By: Sammie Bench M.D.   On: 05/18/2022 17:53    Labs on Admission: I have personally reviewed following labs CBC: Recent Labs  Lab 05/18/22 1801  WBC 6.2  NEUTROABS 4.1  HGB 12.4  HCT 39.2  MCV 93.3  PLT 233   Basic Metabolic Panel: Recent Labs  Lab 05/18/22 1613  NA 140  K 3.6  CL 108  CO2 24  GLUCOSE 133*  BUN 15  CREATININE 0.72  CALCIUM 9.1   GFR: Estimated Creatinine Clearance: 76.8 mL/min (by C-G formula based on SCr of 0.72 mg/dL). Liver Function  Tests: Recent Labs  Lab 05/18/22 1613  AST 58*  ALT 60*  ALKPHOS 119  BILITOT 0.9  PROT 7.1  ALBUMIN 3.9   No results for input(s): "LIPASE", "AMYLASE" in the last 168 hours. No results for input(s): "AMMONIA" in the last 168 hours. Coagulation Profile: No results for input(s): "INR", "PROTIME" in the last 168 hours. Cardiac Enzymes: No results for input(s): "CKTOTAL", "CKMB", "CKMBINDEX", "TROPONINI" in the last 168 hours. BNP (last 3 results) No results for input(s): "PROBNP" in the last 8760 hours. HbA1C: No results for input(s): "HGBA1C" in the last 72 hours. CBG: No results for input(s): "GLUCAP" in the last 168 hours. Lipid Profile: No results for input(s): "CHOL", "  HDL", "LDLCALC", "TRIG", "CHOLHDL", "LDLDIRECT" in the last 72 hours. Thyroid Function Tests: No results for input(s): "TSH", "T4TOTAL", "FREET4", "T3FREE", "THYROIDAB" in the last 72 hours. Anemia Panel: No results for input(s): "VITAMINB12", "FOLATE", "FERRITIN", "TIBC", "IRON", "RETICCTPCT" in the last 72 hours. Urine analysis:    Component Value Date/Time   COLORURINE YELLOW 01/31/2021 1007   APPEARANCEUR CLEAR 01/31/2021 1007   APPEARANCEUR Hazy 12/31/2011 0921   LABSPEC >1.030 (H) 01/31/2021 1007   LABSPEC 1.023 12/31/2011 0921   PHURINE 5.0 01/31/2021 1007   GLUCOSEU NEGATIVE 01/31/2021 1007   GLUCOSEU Negative 12/31/2011 0921   HGBUR NEGATIVE 01/31/2021 1007   BILIRUBINUR NEGATIVE 01/31/2021 1007   BILIRUBINUR Negative 12/31/2011 0921   KETONESUR NEGATIVE 01/31/2021 1007   PROTEINUR NEGATIVE 01/31/2021 1007   NITRITE NEGATIVE 01/31/2021 1007   LEUKOCYTESUR TRACE (A) 01/31/2021 1007   LEUKOCYTESUR 2+ 12/31/2011 0921    This document was prepared using Dragon Voice Recognition software and may include unintentional dictation errors.  Dr. Tobie Poet [facility]  If 7PM-7AM, please contact overnight-coverage provider If 7AM-7PM, please contact day coverage  provider [email or web address]  05/18/2022, 10:23 PM

## 2022-05-18 NOTE — ED Triage Notes (Signed)
Pt states that her cat nipped at her left leg this past Friday and that she cleaned her leg with soap, water, and hydrogen peroxide and put antibiotic ointment on it, but has a concern for infection. Pt states the injury was Friday and the redness to the leg started Saturday.

## 2022-05-19 ENCOUNTER — Observation Stay: Payer: Medicare PPO

## 2022-05-19 ENCOUNTER — Encounter: Payer: Self-pay | Admitting: Internal Medicine

## 2022-05-19 DIAGNOSIS — C50911 Malignant neoplasm of unspecified site of right female breast: Secondary | ICD-10-CM | POA: Diagnosis not present

## 2022-05-19 DIAGNOSIS — R7989 Other specified abnormal findings of blood chemistry: Secondary | ICD-10-CM | POA: Diagnosis not present

## 2022-05-19 DIAGNOSIS — L03116 Cellulitis of left lower limb: Secondary | ICD-10-CM

## 2022-05-19 DIAGNOSIS — E782 Mixed hyperlipidemia: Secondary | ICD-10-CM | POA: Diagnosis not present

## 2022-05-19 DIAGNOSIS — Z96651 Presence of right artificial knee joint: Secondary | ICD-10-CM

## 2022-05-19 LAB — COMPREHENSIVE METABOLIC PANEL
ALT: 53 U/L — ABNORMAL HIGH (ref 0–44)
AST: 48 U/L — ABNORMAL HIGH (ref 15–41)
Albumin: 3.5 g/dL (ref 3.5–5.0)
Alkaline Phosphatase: 115 U/L (ref 38–126)
Anion gap: 7 (ref 5–15)
BUN: 12 mg/dL (ref 8–23)
CO2: 25 mmol/L (ref 22–32)
Calcium: 8.9 mg/dL (ref 8.9–10.3)
Chloride: 109 mmol/L (ref 98–111)
Creatinine, Ser: 0.58 mg/dL (ref 0.44–1.00)
GFR, Estimated: >60 mL/min (ref >60)
Glucose, Bld: 122 mg/dL — ABNORMAL HIGH (ref 70–99)
Potassium: 3.7 mmol/L (ref 3.5–5.1)
Sodium: 141 mmol/L (ref 135–145)
Total Bilirubin: 1 mg/dL (ref 0.3–1.2)
Total Protein: 6.3 g/dL — ABNORMAL LOW (ref 6.5–8.1)

## 2022-05-19 LAB — CBC
HCT: 37.6 % (ref 36.0–46.0)
Hemoglobin: 11.9 g/dL — ABNORMAL LOW (ref 12.0–15.0)
MCH: 29.1 pg (ref 26.0–34.0)
MCHC: 31.6 g/dL (ref 30.0–36.0)
MCV: 91.9 fL (ref 80.0–100.0)
Platelets: 159 10*3/uL (ref 150–400)
RBC: 4.09 MIL/uL (ref 3.87–5.11)
RDW: 13.4 % (ref 11.5–15.5)
WBC: 4.2 10*3/uL (ref 4.0–10.5)
nRBC: 0 % (ref 0.0–0.2)

## 2022-05-19 LAB — SEDIMENTATION RATE: Sed Rate: 34 mm/hr — ABNORMAL HIGH (ref 0–30)

## 2022-05-19 LAB — LACTIC ACID, PLASMA: Lactic Acid, Venous: 0.7 mmol/L (ref 0.5–1.9)

## 2022-05-19 MED ORDER — BACLOFEN 10 MG PO TABS: 10.0000 mg | ORAL_TABLET | Freq: Every day | ORAL | Status: DC | PRN

## 2022-05-19 MED ORDER — OLMESARTAN MEDOXOMIL-HCTZ 20-12.5 MG PO TABS: 1.0000 | ORAL_TABLET | Freq: Every day | ORAL | Status: DC

## 2022-05-19 MED ORDER — LETROZOLE 2.5 MG PO TABS
2.5000 mg | ORAL_TABLET | Freq: Every day | ORAL | Status: DC
  Administered 2022-05-19: 2.5 mg via ORAL
  Filled 2022-05-19: qty 1

## 2022-05-19 MED ORDER — HYDROCHLOROTHIAZIDE 12.5 MG PO TABS
12.5000 mg | ORAL_TABLET | Freq: Every day | ORAL | Status: DC
  Filled 2022-05-19: qty 1

## 2022-05-19 MED ORDER — ROSUVASTATIN CALCIUM 20 MG PO TABS
20.0000 mg | ORAL_TABLET | Freq: Every day | ORAL | Status: DC
  Filled 2022-05-19: qty 1

## 2022-05-19 MED ORDER — IRBESARTAN 150 MG PO TABS
150.0000 mg | ORAL_TABLET | Freq: Every day | ORAL | Status: DC
  Filled 2022-05-19: qty 1

## 2022-05-19 MED ORDER — DOXYCYCLINE MONOHYDRATE 100 MG PO TABS: 100.0000 mg | ORAL_TABLET | Freq: Two times a day (BID) | ORAL | 0 refills | Status: AC

## 2022-05-19 MED ORDER — IOHEXOL 300 MG/ML  SOLN
80.0000 mL | Freq: Once | INTRAMUSCULAR | Status: AC | PRN
  Administered 2022-05-19: 80 mL via INTRAVENOUS

## 2022-05-19 MED ORDER — AMOXICILLIN-POT CLAVULANATE 875-125 MG PO TABS: 1.0000 | ORAL_TABLET | Freq: Two times a day (BID) | ORAL | 0 refills | Status: AC

## 2022-05-19 MED ORDER — TIZANIDINE HCL 2 MG PO TABS: 4.0000 mg | ORAL_TABLET | Freq: Two times a day (BID) | ORAL | Status: DC | PRN

## 2022-05-19 MED ORDER — B COMPLEX-C PO TABS
1.0000 | ORAL_TABLET | Freq: Every day | ORAL | Status: DC
  Administered 2022-05-19: 1 via ORAL
  Filled 2022-05-19: qty 1

## 2022-05-19 MED ORDER — ASPIRIN 325 MG PO TBEC
325.0000 mg | DELAYED_RELEASE_TABLET | Freq: Every day | ORAL | Status: DC
  Administered 2022-05-19: 325 mg via ORAL
  Filled 2022-05-19: qty 1

## 2022-05-19 MED ORDER — ALPRAZOLAM 0.5 MG PO TABS: 0.2500 mg | ORAL_TABLET | Freq: Every day | ORAL | Status: DC | PRN

## 2022-05-19 NOTE — ED Notes (Signed)
Pin Pad unavailable. Patient verbalized understanding of discharge instructions.

## 2022-05-19 NOTE — Discharge Instructions (Signed)
Please take all prescribed antibiotics exactly as instructed Please follow up with your primary care provider in 1 to 2 weeks Please consume a regular diet Please weight-bear as tolerated Please return to the emergency department if you develop any worsening pain, weakness, inability to tolerate oral intake, increasing redness/swelling of your leg or fevers in excess of 100.4 F.

## 2022-05-19 NOTE — Assessment & Plan Note (Signed)
-   Recheck CMP in the a.m., holding home rosuvastatin at this time - Elevated LFTs could be secondary to cellulitis and or statin medication

## 2022-05-19 NOTE — Hospital Course (Addendum)
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    Upon evaluation in the emergency department patient was felt to be suffering from a cellulitis of the left lower extremity secondary to a cat bite she experienced several days ago.  Hospitalist group was called and patient was admitted to hospitalist service.  Patient was initiated on intravenous ceftriaxone and vancomycin initially.  Blood cultures were obtained.  Throughout the hospitalization patient's left lower extremity pain redness and swelling rapidly improved.  Left lower extremity was additionally evaluated further via CT imaging which revealed no evidence of underlying abscess formation or joint involvement.  Due to rapid clinical improvement patient was discharged home on oral Augmentin and doxycycline to complete a 7-day course of antibiotic therapy.  At time of discharge patient's blood cultures remained negative.

## 2022-05-19 NOTE — Discharge Summary (Signed)
Physician Discharge Summary   Patient: Regina Blanchard MRN: [id number] DOB: 1950/12/26  Admit date:     05/18/2022  Discharge date: 05/19/22  Discharge Physician: Vernelle Emerald   PCP: Rusty Aus, MD   Recommendations at discharge:    Patient is been instructed to take her entire course of doxycycline and Augmentin to complete 7 days of antibiotics Patient has been instructed to follow-up with her primary care provider in 1 to 2 weeks for follow-up evaluation Patient instructed to return the emergency department if she develops any worsening pain weakness redness/swelling of the leg or fevers next of 100.4 F.  Discharge Diagnoses: Principal Problem:   Left leg cellulitis Active Problems:   Fibrocystic breast disease   Hyperlipidemia, mixed   Status post total right knee replacement   Invasive ductal carcinoma of right breast (HCC)   Elevated LFTs   Cellulitis of left leg  Resolved Problems:   * No resolved hospital problems. 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    Upon evaluation in the emergency department patient was felt to be suffering from a cellulitis of the left lower extremity secondary to a cat bite she experienced several days ago.  Hospitalist group was called and patient was admitted to hospitalist service.  Patient was initiated on intravenous ceftriaxone and vancomycin initially.  Blood cultures were obtained.  Throughout the hospitalization patient's left lower extremity pain redness and swelling rapidly improved.  Left lower extremity was additionally evaluated further via CT imaging which revealed no evidence of underlying abscess  formation or joint involvement.  Due to rapid clinical improvement patient was discharged home on oral Augmentin and doxycycline to complete a 7-day course of antibiotic therapy.  At time of discharge patient's blood cultures remained negative.     Consultants: None Procedures performed: None  Disposition: Home Diet recommendation:  Discharge Diet Orders (From admission, onward)     Start     Ordered   05/19/22 0000  Diet - low sodium heart healthy        05/19/22 1311           Cardiac diet  DISCHARGE MEDICATION: Allergies as of 05/19/2022   No Known Allergies      Medication List     STOP taking these medications    benzonatate 100 MG capsule Commonly known as: Tessalon Perles   oseltamivir 75 MG capsule Commonly known as: TAMIFLU   promethazine-dextromethorphan 6.25-15 MG/5ML syrup Commonly known as: PROMETHAZINE-DM       TAKE these medications    ALPRAZolam 0.25 MG tablet Commonly known as: XANAX Take 0.25 mg by mouth daily as needed for anxiety or sleep.   amoxicillin 500 MG capsule Commonly known as: AMOXIL   amoxicillin-clavulanate 875-125 MG tablet Commonly known as: AUGMENTIN Take 1 tablet by mouth 2 (two) times daily for 13 doses.   aspirin EC 325 MG tablet Take 325 mg by mouth daily.   B-complex with vitamin C tablet Take 1 tablet by mouth daily.   baclofen 10 MG tablet Commonly known as: LIORESAL Take 10 mg by mouth daily as needed for muscle spasms.   doxycycline 100 MG tablet Commonly known as: ADOXA Take 1 tablet (100  mg total) by mouth 2 (two) times daily for 13 doses.   letrozole 2.5 MG tablet Commonly known as: FEMARA Take 1 tablet (2.5 mg total) by mouth daily.   olmesartan-hydrochlorothiazide 20-12.5 MG tablet Commonly known as: BENICAR HCT Take 0.5 tablets by mouth daily. Hold for low blood pressure.   rosuvastatin 40 MG tablet Commonly known as: CRESTOR Take 20 mg by mouth.   tiZANidine 4 MG tablet Commonly  known as: ZANAFLEX Take 4 mg by mouth 2 (two) times daily as needed for muscle spasms.   Vitamin D 50 MCG (2000 UT) Caps Take 6,000 Units by mouth daily.        Follow-up Information     Rusty Aus, MD Follow up in 1 week(s).   Specialty: Internal Medicine Contact information: [address] [place] [address] [phone]                 Discharge Exam: Danley Danker Weights   05/18/22 1605  Weight: 87.5 kg    Constitutional: Awake alert and oriented x3, no associated distress.   Respiratory: clear to auscultation bilaterally, no wheezing, no crackles. Normal respiratory effort. No accessory muscle use.  Cardiovascular: Regular rate and rhythm, no murmurs / rubs / gallops. No extremity edema. 2+ pedal pulses. No carotid bruits.  Abdomen: Abdomen is soft and nontender.  No evidence of intra-abdominal masses.  Positive bowel sounds noted in all quadrants.   Musculoskeletal: Improving left lower extremity tenderness.  Improving redness of the left lower extremity with minimal induration.      Condition at discharge: fair  The results of significant diagnostics from this hospitalization (including imaging, microbiology, ancillary and laboratory) are listed below for reference.   Imaging Studies: CT KNEE LEFT W CONTRAST  Result Date: 05/19/2022 CLINICAL DATA:  Pt states that her cat nipped at her left leg this past Friday and that she cleaned her leg with soap, water, and hydrogen peroxide and put antibiotic ointment on it, but has a concern for infection. EXAM: CT OF THE LEFT KNEE WITH CONTRAST TECHNIQUE: Multidetector CT imaging was performed following the standard protocol during bolus administration of intravenous contrast. RADIATION DOSE REDUCTION: This exam was performed according to the departmental dose-optimization program which includes automated exposure control, adjustment of the mA and/or kV according to patient size  and/or use of iterative reconstruction technique. CONTRAST:  27m OMNIPAQUE IOHEXOL 300 MG/ML  SOLN COMPARISON:  None Available. FINDINGS: Bones/Joint/Cartilage No acute fracture or dislocation. Left total knee arthroplasty. No hardware failure or complication. Normal alignment. Small joint effusion. Ligaments Ligaments are suboptimally evaluated by CT. Muscles and Tendons Muscles are normal. No muscle atrophy. No intramuscular fluid collection or hematoma. Quadriceps tendon and patellar tendon are intact. Soft tissue No fluid collection or hematoma. No soft tissue mass. Mild skin thickening along the lateral aspect of the knee and proximal lower leg concerning for smiled cellulitis. IMPRESSION: 1. Mild skin thickening along the lateral aspect of the knee and proximal lower leg concerning for smiled cellulitis. No drainable fluid collection to suggest an abscess. 2. Left total knee arthroplasty without hardware failure or complication. Electronically Signed   By: HKathreen DevoidM.D.   On: 05/19/2022 10:30   DG Tibia/Fibula Left  Result Date: 05/18/2022 CLINICAL DATA:  Cellulitis EXAM: LEFT TIBIA AND FIBULA - 2 VIEW COMPARISON:  None Available. FINDINGS: There is no evidence of fracture or other focal bone lesions. Nonspecific soft tissue swelling identified at the ankle level. IMPRESSION: Ankle  soft tissue swelling.  No osseous abnormalities. Electronically Signed   By: Sammie Bench M.D.   On: 05/18/2022 17:53   DG Knee Complete 4 Views Left  Result Date: 05/18/2022 CLINICAL DATA:  Cellulitis EXAM: LEFT KNEE - COMPLETE 4 VIEW COMPARISON:  None Available. FINDINGS: No acute fracture, dislocation or subluxation. Status post total knee arthroplasty. No periprosthetic lucency to suggest loosening. No effusion. Unremarkable appearance of soft tissues. IMPRESSION: Postop changes total knee arthroplasty.  No acute findings. Electronically Signed   By: Sammie Bench M.D.   On: 05/18/2022 17:53     Microbiology: Results for orders placed or performed during the hospital encounter of 05/18/22  Blood culture (routine x 2)     Status: None (Preliminary result)   Collection Time: 05/19/22 12:10 AM   Specimen: BLOOD  Result Value Ref Range Status   Specimen Description BLOOD BLOOD LEFT WRIST  Final   Special Requests   Final    BOTTLES DRAWN AEROBIC AND ANAEROBIC Blood Culture adequate volume   Culture   Final    NO GROWTH < 12 HOURS Performed at [facility], [address], [place], [place] [address]    Report Status PENDING  Incomplete  Blood culture (routine x 2)     Status: None (Preliminary result)   Collection Time: 05/19/22 12:17 AM   Specimen: BLOOD LEFT WRIST  Result Value Ref Range Status   Specimen Description BLOOD LEFT WRIST  Final   Special Requests   Final    BOTTLES DRAWN AEROBIC AND ANAEROBIC Blood Culture results may not be optimal due to an inadequate volume of blood received in culture bottles   Culture   Final    NO GROWTH < 12 HOURS Performed at [facility], [address] [place], [place] [address]    Report Status PENDING  Incomplete    Labs: CBC: Recent Labs  Lab 05/18/22 1801 05/19/22 0522  WBC 6.2 4.2  NEUTROABS 4.1  --   HGB 12.4 11.9*  HCT 39.2 37.6  MCV 93.3 91.9  PLT 180 021   Basic Metabolic Panel: Recent Labs  Lab 05/18/22 1613 05/19/22 0522  NA 140 141  K 3.6 3.7  CL 108 109  CO2 24 25  GLUCOSE 133* 122*  BUN 15 12  CREATININE 0.72 0.58  CALCIUM 9.1 8.9   Liver Function Tests: Recent Labs  Lab 05/18/22 1613 05/19/22 0522  AST 58* 48*  ALT 60* 53*  ALKPHOS 119 115  BILITOT 0.9 1.0  PROT 7.1 6.3*  ALBUMIN 3.9 3.5   CBG: No results for input(s): "GLUCAP" in the last 168 hours.  Discharge time spent: greater than 30 minutes.  Signed: Vernelle Emerald, MD [facility] 05/19/2022

## 2022-05-19 NOTE — Assessment & Plan Note (Signed)
-   Check sed rate, blood cultures, lactic acid - Continue Unasyn and vancomycin per pharmacy

## 2022-05-19 NOTE — Assessment & Plan Note (Signed)
-   Letrozole 2.5 mg daily resumed

## 2022-05-19 NOTE — Assessment & Plan Note (Signed)
-   Rosuvastatin 20 mg nightly not resumed on admission due to elevated LFTs

## 2022-05-24 LAB — CULTURE, BLOOD (ROUTINE X 2)
Culture: NO GROWTH
Culture: NO GROWTH
Special Requests: ADEQUATE

## 2022-05-27 ENCOUNTER — Inpatient Hospital Stay: Payer: Medicare PPO | Attending: Oncology | Admitting: Licensed Clinical Social Worker

## 2022-05-27 ENCOUNTER — Inpatient Hospital Stay: Payer: Medicare PPO

## 2022-05-27 ENCOUNTER — Encounter: Payer: Self-pay | Admitting: Licensed Clinical Social Worker

## 2022-05-27 DIAGNOSIS — Z803 Family history of malignant neoplasm of breast: Secondary | ICD-10-CM

## 2022-05-27 DIAGNOSIS — C50911 Malignant neoplasm of unspecified site of right female breast: Secondary | ICD-10-CM

## 2022-05-27 DIAGNOSIS — Z800 Family history of malignant neoplasm of digestive organs: Secondary | ICD-10-CM

## 2022-05-27 DIAGNOSIS — Z853 Personal history of malignant neoplasm of breast: Secondary | ICD-10-CM | POA: Diagnosis not present

## 2022-05-27 NOTE — Progress Notes (Signed)
REFERRING PROVIDER: Verlon Au, NP [place],  [place] [address]  PRIMARY PROVIDER:  Rusty Aus, MD  PRIMARY REASON FOR VISIT:  1. 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 Family history of breast cancer   3. History of breast cancer   4. Family history of colon cancer      HISTORY OF PRESENT ILLNESS:   Regina Blanchard, a 72 y.o. female, was seen for a Regina Blanchard at the request of Dr. Zenia Resides due to a personal and family history of breast cancer.  Regina Blanchard presents to clinic today to discuss the possibility of a hereditary predisposition to cancer, genetic testing, and to further clarify her future cancer risks, as well as potential cancer risks for family members.   At the age of 38, Regina Blanchard was diagnosed with DCIS of the right breast. The treatment plan included lumpectomy, adjuvant radiation and tamoxifen.  In 2022, at the age of 51, Regina Blanchard was diagnosed with IDC of the right breast. The treatment plan included lumpectomy and then total mastectomy, and she is currently taking letrozole.  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  OCP use for approximately 1 year, maybe less. Ovaries intact: yes.  Hysterectomy: no.  Menopausal status: postmenopausal.  HRT use:  short time, less than 1  year Colonoscopy: yes;  reports few polyps .  Past Medical History:  Diagnosis Date   Back pain    Breast cancer ([place]) 2004   right breast cancer   Cervical polyp    Deep vein thrombosis (DVT) of  popliteal vein of right lower extremity ([place]) 05/24/2021   Degenerative arthritis of right knee    Diverticulosis    Family history of adverse reaction to anesthesia    brother became combative in post op   Hypertension    Pneumonia    as a child "drank kerosine when a baby"   PONV (postoperative nausea and vomiting)    x1 nausea only   Status post radiation therapy    2004 breast cancer   Vitamin D deficiency     Past Surgical History:  Procedure Laterality Date   BREAST BIOPSY Left    1991 negative 2006 negative   BREAST BIOPSY Bilateral    BREAST BIOPSY  04/29/2021   X2  12:00 heart-fat necrosis, 12:30 coil-[facility]   BREAST EXCISIONAL BIOPSY Right    positive 2004    BREAST LUMPECTOMY Right 2004   BREAST LUMPECTOMY,RADIO FREQ LOCALIZER,AXILLARY SENTINEL LYMPH NODE BIOPSY Right 05/22/2021   Procedure: Re-excision BREAST LUMPECTOMY,RADIO FREQ LOCALIZER,AXILLARY SENTINEL LYMPH NODE BIOPSY;  Surgeon: Benjamine Sprague, DO;  Location: [facility] ORS;  Service: General;  Laterality: Right;   COLONOSCOPY     2008, 2011, 2022   [place]   infertility   KNEE ARTHROPLASTY Right 07/06/2018   Procedure: COMPUTER ASSISTED TOTAL KNEE ARTHROPLASTY;  Surgeon: Dereck Leep, MD;  Location: [facility] ORS;  Service: Orthopedics;  Laterality: Right;  KNEE ARTHROPLASTY Left 02/10/2021   Procedure: COMPUTER ASSISTED TOTAL KNEE ARTHROPLASTY;  Surgeon: Dereck Leep, MD;  Location: [facility] ORS;  Service: Orthopedics;  Laterality: Left;   LEFT HEART CATH AND CORONARY ANGIOGRAPHY Left 03/26/2022   Procedure: LEFT HEART CATH AND CORONARY ANGIOGRAPHY;  Surgeon: Yolonda Kida, MD;  Location: [place] [facility];  Service: Cardiovascular;  Laterality: Left;   SQUINT REPAIR Left 1960   age 40 (CROSS EYE SURGERY)   TOTAL MASTECTOMY Right 07/29/2021   Procedure: TOTAL MASTECTOMY;  Surgeon: Benjamine Sprague, DO;  Location: [facility] OR[facility];  Service: General;  Laterality:  Right;    FAMILY HISTORY:  We obtained a detailed, 4-generation family history.  Significant diagnoses are listed below: Family History  Problem Relation Age of Onset   Diabetes Mother    Heart disease Father    Diabetes Sister    Breast cancer Maternal Aunt 55   Colon cancer Maternal Aunt        dx 54s + lung cancer   Hypertension Maternal Aunt    Breast cancer Paternal Aunt        dx 42s   Regina Blanchard has 1 adopted daughter. She has 2 sisters and 1 brother. None have had cancer.  Regina Blanchard mother died in her 4s due to Alzheimer's. Patient had 11 maternal aunts/uncles. One aunt had breast cancer in her 71s. Another aunt had colon and lung cancer in her 47s. No other known cancers on this side of the family.  Regina Blanchard father died at 72 due to heart issues. Patient had 2 paternal aunts and 2 paternal uncles. One aunt had breast cancer in her 2s. No other known cancers on this side of the family.  Regina Blanchard is unaware of previous family history of genetic testing for hereditary cancer risks. There is no reported Ashkenazi Jewish ancestry. There is no known consanguinity.    GENETIC COUNSELING ASSESSMENT: Regina Blanchard is a 71 y.o. female with a personal and family history of breast cancer which is somewhat suggestive of a hereditary cancer syndrome and predisposition to cancer. We, therefore, discussed and recommended the following at today's visit.   DISCUSSION: We discussed that approximately 10% of breast cancer is hereditary. Most cases of hereditary breast cancer are associated with BRCA1/BRCA2 genes, although there are other genes associated with hereditary cancer as well. Cancers and risks are gene specific. We discussed that testing is beneficial for several reasons including knowing about cancer risks, identifying potential screening and risk-reduction options that may be appropriate, and to understand if other family members could be at risk for cancer and allow them to undergo  genetic testing.   We reviewed the characteristics, features and inheritance patterns of hereditary cancer syndromes. We also discussed genetic testing, including the appropriate family members to test, the process of testing, insurance coverage and turn-around-time for results. We discussed the implications of a negative, positive and/or variant of uncertain significant result. We recommended Regina Blanchard pursue genetic testing for the [facility] Common Hereditary Cancers+RNA gene panel.   Based on Regina Blanchard's personal and family history of cancer, she meets medical criteria for genetic testing. Despite that she meets criteria, she may still have an out of pocket cost. We discussed that if her out of pocket cost for testing is over $100, the laboratory will call and confirm whether she wants to proceed with testing.  If the out of pocket cost of testing is less than $100 she will be billed by the genetic testing  laboratory.   PLAN: After considering the risks, benefits, and limitations, Regina Blanchard provided informed consent to pursue genetic testing and the blood sample was sent to [facility] for analysis of the Common Hereditary Cancers+RNA panel. Results should be available within approximately 2-3 weeks' time, at which point they will be disclosed by telephone to Regina Blanchard, as will any additional recommendations warranted by these results. Regina Blanchard will receive a summary of her genetic counseling visit and a copy of her results once available. This information will also be available in Epic.   Regina Blanchard questions were answered to her satisfaction today. Our contact information was provided should additional questions or concerns arise. Thank you for the referral and allowing [place] to share in the care of your patient.   Faith Rogue, MS, [facility] Genetic Counselor [email or web address] Phone: [phone]  The patient was seen for a total of 25 minutes in face-to-face genetic counseling.  Dr.  Grayland Ormond was available for discussion regarding this case.   _______________________________________________________________________ For Office Staff:  Number of people involved in session: 1 Was an Intern/ student involved with case: no

## 2022-06-01 ENCOUNTER — Emergency Department: Payer: Medicare PPO

## 2022-06-01 ENCOUNTER — Other Ambulatory Visit: Payer: Self-pay

## 2022-06-01 ENCOUNTER — Emergency Department
Admission: EM | Admit: 2022-06-01 | Discharge: 2022-06-01 | Disposition: A | Discharge status: Home / Self Care | Payer: Medicare PPO | Attending: Emergency Medicine | Admitting: Emergency Medicine

## 2022-06-01 DIAGNOSIS — K5732 Diverticulitis of large intestine without perforation or abscess without bleeding: Secondary | ICD-10-CM | POA: Insufficient documentation

## 2022-06-01 DIAGNOSIS — A0811 Acute gastroenteropathy due to Norwalk agent: Secondary | ICD-10-CM | POA: Insufficient documentation

## 2022-06-01 DIAGNOSIS — K5792 Diverticulitis of intestine, part unspecified, without perforation or abscess without bleeding: Secondary | ICD-10-CM

## 2022-06-01 DIAGNOSIS — E876 Hypokalemia: Secondary | ICD-10-CM | POA: Diagnosis not present

## 2022-06-01 DIAGNOSIS — Z20822 Contact with and (suspected) exposure to covid-19: Secondary | ICD-10-CM | POA: Diagnosis not present

## 2022-06-01 DIAGNOSIS — D72829 Elevated white blood cell count, unspecified: Secondary | ICD-10-CM | POA: Insufficient documentation

## 2022-06-01 DIAGNOSIS — R1011 Right upper quadrant pain: Secondary | ICD-10-CM | POA: Diagnosis present

## 2022-06-01 LAB — GASTROINTESTINAL PANEL BY PCR, STOOL (REPLACES STOOL CULTURE)

## 2022-06-01 LAB — RESP PANEL BY RT-PCR (RSV, FLU A&B, COVID)  RVPGX2
Influenza A by PCR: NEGATIVE
Influenza B by PCR: NEGATIVE
Resp Syncytial Virus by PCR: NEGATIVE
SARS Coronavirus 2 by RT PCR: NEGATIVE

## 2022-06-01 LAB — COMPREHENSIVE METABOLIC PANEL
ALT: 16 U/L (ref 0–44)
AST: 20 U/L (ref 15–41)
Albumin: 4.1 g/dL (ref 3.5–5.0)
Alkaline Phosphatase: 102 U/L (ref 38–126)
Anion gap: 10 (ref 5–15)
BUN: 11 mg/dL (ref 8–23)
CO2: 27 mmol/L (ref 22–32)
Calcium: 9.4 mg/dL (ref 8.9–10.3)
Chloride: 102 mmol/L (ref 98–111)
Creatinine, Ser: 0.84 mg/dL (ref 0.44–1.00)
GFR, Estimated: >60 mL/min (ref >60)
Glucose, Bld: 149 mg/dL — ABNORMAL HIGH (ref 70–99)
Potassium: 3.4 mmol/L — ABNORMAL LOW (ref 3.5–5.1)
Sodium: 139 mmol/L (ref 135–145)
Total Bilirubin: 1.2 mg/dL (ref 0.3–1.2)
Total Protein: 7.2 g/dL (ref 6.5–8.1)

## 2022-06-01 LAB — C DIFFICILE QUICK SCREEN W PCR REFLEX
C Diff antigen: NEGATIVE
C Diff interpretation: NOT DETECTED
C Diff toxin: NEGATIVE

## 2022-06-01 LAB — CBC WITH DIFFERENTIAL/PLATELET
Abs Immature Granulocytes: 0.05 10*3/uL (ref 0.00–0.07)
Basophils Absolute: 0 10*3/uL (ref 0.0–0.1)
Basophils Relative: 0 %
Eosinophils Absolute: 0.2 10*3/uL (ref 0.0–0.5)
Eosinophils Relative: 1 %
HCT: 40.1 % (ref 36.0–46.0)
Hemoglobin: 12.6 g/dL (ref 12.0–15.0)
Immature Granulocytes: 0 %
Lymphocytes Relative: 7 %
Lymphs Abs: 1 10*3/uL (ref 0.7–4.0)
MCH: 29.5 pg (ref 26.0–34.0)
MCHC: 31.4 g/dL (ref 30.0–36.0)
MCV: 93.9 fL (ref 80.0–100.0)
Monocytes Absolute: 0.8 10*3/uL (ref 0.1–1.0)
Monocytes Relative: 5 %
Neutro Abs: 12.5 10*3/uL — ABNORMAL HIGH (ref 1.7–7.7)
Neutrophils Relative %: 87 %
Platelets: 246 10*3/uL (ref 150–400)
RBC: 4.27 MIL/uL (ref 3.87–5.11)
RDW: 13.4 % (ref 11.5–15.5)
WBC: 14.5 10*3/uL — ABNORMAL HIGH (ref 4.0–10.5)
nRBC: 0 % (ref 0.0–0.2)

## 2022-06-01 LAB — TROPONIN I (HIGH SENSITIVITY)
Troponin I (High Sensitivity): 4 ng/L (ref <18)
Troponin I (High Sensitivity): 4 ng/L (ref <18)

## 2022-06-01 LAB — LIPASE, BLOOD: Lipase: 28 U/L (ref 11–51)

## 2022-06-01 MED ORDER — ONDANSETRON 4 MG PO TBDP: 4.0000 mg | ORAL_TABLET | Freq: Three times a day (TID) | ORAL | 0 refills | Status: AC | PRN

## 2022-06-01 MED ORDER — METRONIDAZOLE 500 MG PO TABS: 500.0000 mg | ORAL_TABLET | Freq: Three times a day (TID) | ORAL | 0 refills | Status: AC

## 2022-06-01 MED ORDER — SODIUM CHLORIDE 0.9 % IV BOLUS
1000.0000 mL | Freq: Once | INTRAVENOUS | Status: AC
  Administered 2022-06-01: 1000 mL via INTRAVENOUS

## 2022-06-01 MED ORDER — FENTANYL CITRATE PF 50 MCG/ML IJ SOSY
50.0000 ug | PREFILLED_SYRINGE | Freq: Once | INTRAMUSCULAR | Status: AC
  Administered 2022-06-01: 50 ug via INTRAVENOUS
  Filled 2022-06-01: qty 1

## 2022-06-01 MED ORDER — ONDANSETRON HCL 4 MG/2ML IJ SOLN
4.0000 mg | Freq: Once | INTRAMUSCULAR | Status: AC
  Administered 2022-06-01: 4 mg via INTRAVENOUS
  Filled 2022-06-01: qty 2

## 2022-06-01 MED ORDER — METOCLOPRAMIDE HCL 5 MG/ML IJ SOLN
10.0000 mg | Freq: Once | INTRAMUSCULAR | Status: AC
  Administered 2022-06-01: 10 mg via INTRAVENOUS
  Filled 2022-06-01: qty 2

## 2022-06-01 MED ORDER — IOHEXOL 300 MG/ML  SOLN
100.0000 mL | Freq: Once | INTRAMUSCULAR | Status: AC | PRN
  Administered 2022-06-01: 100 mL via INTRAVENOUS

## 2022-06-01 MED ORDER — CIPROFLOXACIN HCL 500 MG PO TABS: 500.0000 mg | ORAL_TABLET | Freq: Two times a day (BID) | ORAL | 0 refills | Status: AC

## 2022-06-01 NOTE — ED Notes (Signed)
Patient transported to CT 

## 2022-06-01 NOTE — Discharge Instructions (Addendum)
You were positive for norovirus which is a virus but also had some signs of diverticulitis.  You can take Tylenol 1 g every 8 hours to help with pain as well as ibuprofen 600 every 6-8 hours.  You should be on a liquid diet with bowel rest, low fiber foods.  We discussed antibiotics and is difficult to tell if you would benefit from these given you are also norovirus positive but given your elevated white count we will prescribe a short course.. if you develop worsening pain, fevers or any other concerns return to the ER for repeat evaluation.   IMPRESSION: Possible mild colitis/diverticulitis greatest about the descending colon.

## 2022-06-01 NOTE — ED Notes (Signed)
Pt tolerated PO challenge

## 2022-06-01 NOTE — ED Provider Notes (Signed)
[facility] Provider Note    Event Date/Time   First MD Initiated Contact with Patient 06/01/22 0144     (approximate)   History   Abdominal Pain   HPI  Regina Blanchard is a 72 y.o. female otherwise healthy who comes in with concerns for abdominal discomfort.  Patient is concerned of right upper quadrant pain that started yesterday.  Patient does have some associated nausea vomiting diarrhea she denies any known history of gallbladder's.  Patient reports being here on 12/25 and on antibiotics for cellulitis.  Patient reports having few days of nausea, vomiting, diarrhea.  She reports that her diarrhea is about 5 times daily and a greenish color.  She states that she is not sure if it is from the antibiotics that she was on for a cat bite.  She reports however now having right upper quadrant pain.  Denies any chest pain, shortness of breath.  She reports that the pain occasionally does radiate down into her abdomen.  Denies any falls or hitting her head.   Physical Exam   Triage Vital Signs: ED Triage Vitals [06/01/22 0134]  Enc Vitals Group     BP 134/71     Pulse Rate (!) 124     Resp 20     Temp 98 F (36.7 C)     Temp Source Oral     SpO2 96 %     Weight      Height      Head Circumference      Peak Flow      Pain Score      Pain Loc      Pain Edu?      Excl. in [place]?     Most recent vital signs: Vitals:   06/01/22 0134  BP: 134/71  Pulse: (!) 124  Resp: 20  Temp: 98 F (36.7 C)  SpO2: 96%     General: Awake, no distress.  CV:  Good peripheral perfusion.  Resp:  Normal effort.  Abd:  No distention.  Tender in the right upper quadrant but also little bit in the lower abdomen. Other:  No swelling in legs   ED Results / Procedures / Treatments   Labs (all labs ordered are listed, but only abnormal results are displayed) Labs Reviewed  CBC WITH DIFFERENTIAL/PLATELET  COMPREHENSIVE METABOLIC PANEL  LIPASE, BLOOD  URINALYSIS,  ROUTINE W REFLEX MICROSCOPIC  TROPONIN I (HIGH SENSITIVITY)     EKG  My interpretation of EKG:  Sinus tachycardia rate of 112 without any ST elevation or T wave versions, normal intervals  RADIOLOGY I have reviewed the xray personally and interpreted no free air   PROCEDURES:  Critical Care performed: No  Procedures   MEDICATIONS ORDERED IN ED: Medications  sodium chloride 0.9 % bolus 1,000 mL (1,000 mLs Intravenous New Bag/Given 06/01/22 0215)  fentaNYL (SUBLIMAZE) injection 50 mcg (50 mcg Intravenous Given 06/01/22 0215)  ondansetron (ZOFRAN) injection 4 mg (4 mg Intravenous Given 06/01/22 0215)     IMPRESSION / MDM / ASSESSMENT AND PLAN / ED COURSE  I reviewed the triage vital signs and the nursing notes.   Patient's presentation is most consistent with acute presentation with potential threat to life or bodily function.   Patient comes in tachycardic with concerns of right upper quadrant pain.  Will test for COVID, flu, electrolyte abnormalities, AKI, UTI, ultrasound to evaluate for any cholecystitis and chest x-ray to evaluate for any pleural effusion, pneumonia, free air.  CBC shows elevated white count with a left shift. Troponins are negative x 2.  Patient is norovirus positive C. difficile negative.  COVID-negative CMP shows slightly low potassium of 3.4  Ultrasound was negative.  IMPRESSION: Possible mild colitis/diverticulitis greatest about the descending colon.    Patient is tolerating p.o.  I offered patient admission based upon symptomatic management versus going home and patient reports feeling much better and would prefer to trial home.  We discussed diet.  We discussed pros and cons of antibiotics and will proceed with a short course.  We discussed 6-week posttreatment colonoscopy.   The patient is on the cardiac monitor to evaluate for evidence of arrhythmia and/or significant heart rate changes.      FINAL CLINICAL IMPRESSION(S) / ED DIAGNOSES    Final diagnoses:  Norovirus  Diverticulitis     Rx / DC Orders   ED Discharge Orders          Ordered    ondansetron (ZOFRAN-ODT) 4 MG disintegrating tablet  Every 8 hours PRN        06/01/22 0520    metroNIDAZOLE (FLAGYL) 500 MG tablet  3 times daily        06/01/22 0520    ciprofloxacin (CIPRO) 500 MG tablet  2 times daily        06/01/22 6387             Note:  This document was prepared using Dragon voice recognition software and may include unintentional dictation errors.   Vanessa [place], MD 06/01/22 [phone]

## 2022-06-01 NOTE — ED Triage Notes (Signed)
Pt presents to ER with c/o RUQ pain that started yesterday.  Pt endorses some n/v/d that has been ongoing as well.  Pt states pain is mainly epigastric with radiation through RUQ and back.  Pt denies any hx of gall bladder problems.  Pt states she was seen here 12/25 for cellulitis and was on abx for a while.  Pt is otherwise A&O x4 at this time.

## 2022-06-03 ENCOUNTER — Encounter: Payer: Self-pay | Admitting: Licensed Clinical Social Worker

## 2022-06-03 ENCOUNTER — Telehealth: Payer: Self-pay | Admitting: Licensed Clinical Social Worker

## 2022-06-03 ENCOUNTER — Ambulatory Visit: Payer: Self-pay | Admitting: Licensed Clinical Social Worker

## 2022-06-03 DIAGNOSIS — Z1379 Encounter for other screening for genetic and chromosomal anomalies: Secondary | ICD-10-CM | POA: Insufficient documentation

## 2022-06-03 NOTE — Telephone Encounter (Signed)
I contacted Ms. Debnam to discuss her genetic testing results. No pathogenic variants were identified in the 48 genes analyzed. Detailed clinic note to follow.   The test report has been scanned into EPIC and is located under the Molecular Pathology section of the Results Review tab.  A portion of the result report is included below for reference.      Faith Rogue, MS, [facility] Genetic Counselor [email or web address] Phone: [phone]

## 2022-06-03 NOTE — Progress Notes (Signed)
HPI:   Ms. Regina Blanchard was previously seen in the [place] clinic due to a personal and family history of cancer and concerns regarding a hereditary predisposition to cancer. Please refer to our prior cancer genetics clinic note for more information regarding our discussion, assessment and recommendations, at the time. Ms. Regina Blanchard recent genetic test results were disclosed to her, as were recommendations warranted by these results. These results and recommendations are discussed in more detail below.  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  Significant diagnoses are listed below: Family History  Problem Relation Age of Onset   Diabetes Mother    Heart disease Father    Diabetes Sister    Breast cancer Maternal Aunt 26   Colon cancer Maternal Aunt        dx 32s + lung cancer   Hypertension Maternal Aunt    Breast cancer Paternal Aunt        dx 19s    Ms. Regina Blanchard has 1 adopted daughter. She has 2 sisters and 1 brother. None have had cancer.   Ms. Regina Blanchard mother died in her 3s due to Alzheimer's. Patient had 11 maternal aunts/uncles. One aunt had breast cancer in her 73s. Another aunt had colon and lung cancer in her 9s. No other known cancers on this side of the family.   Ms. Regina Blanchard's father died at 59 due to heart issues. Patient had 2 paternal aunts and 2 paternal uncles. One aunt had breast cancer in her 74s. No other known cancers on this side of the family.   Ms. Regina Blanchard is unaware of previous family history  of genetic testing for hereditary cancer risks. There is no reported Ashkenazi Jewish ancestry. There is no known consanguinity.     GENETIC TEST RESULTS:  The [facility] Common Hereditary Cancers+RNA Panel found no pathogenic mutations.   The Common Hereditary Cancers Panel + RNA offered by [facility] includes sequencing and/or deletion duplication testing of the following 48 genes: APC*, ATM*, AXIN2, BAP1, BARD1, BMPR1A, BRCA1, BRCA2, BRIP1, CDH1, CDK4, CDKN2A (p14ARF), CDKN2A (p16INK4a), CHEK2, CTNNA1, DICER1*, EPCAM*, FH*, GREM1*, HOXB13, KIT, MBD4, MEN1*, MLH1*, MSH2*, MSH3*, MSH6*, MUTYH, NF1*, NTHL1, PALB2, PDGFRA, PMS2*, POLD1*, POLE, PTEN*, RAD51C, RAD51D, SDHA*, SDHB, SDHC*, SDHD, SMAD4, SMARCA4, STK11, TP53, TSC1*, TSC2, VHL.   The test report has been scanned into EPIC and is located under the Molecular Pathology section of the Results Review tab.  A portion of the result report is included below for reference. Genetic testing reported out on 06/03/2022.      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  At this time, it is unknown if this variant is associated with an increased risk for cancer or if it is benign, but most uncertain variants are reclassified to benign. It should not be used to make medical management decisions. With time, we suspect the laboratory will determine the significance of this variant, if any. If the laboratory reclassifies this variant, we will attempt to contact Ms. Regina Blanchard to discuss it  further.   Even though a pathogenic variant was not identified, possible explanations for the cancer in the family may include: There may be no hereditary risk for cancer in the family. The cancers in Ms. Primo and/or her family may be sporadic/familial or due to other genetic and environmental factors. There may be a gene mutation in one of these genes that current testing methods cannot detect but that chance is small. There could be another  gene that has not yet been discovered, or that we have not yet tested, that is responsible for the cancer diagnoses in the family.  It is also possible there is a hereditary cause for the cancer in the family that Ms. Regina Blanchard did not inherit.  Therefore, it is important to remain in touch with cancer genetics in the future so that we can continue to offer Ms. Regina Blanchard the most up to date genetic testing.   ADDITIONAL GENETIC TESTING:  We discussed with Ms. Regina Blanchard that her genetic testing was fairly extensive.  If there are additional relevant genes identified to increase cancer risk that can be analyzed in the future, we would be happy to discuss and coordinate this testing at that time.    CANCER SCREENING RECOMMENDATIONS:  Ms. Regina Blanchard's test result is considered negative (normal).  This means that we have not identified a hereditary cause for her personal and family history of cancer at this time.   An individual's cancer risk and medical management are not determined by genetic test results alone. Overall cancer risk assessment incorporates additional factors, including personal medical history, family history, and any available genetic information that may result in a personalized plan for cancer prevention and surveillance. Therefore, it is recommended she continue to follow the cancer management and screening guidelines provided by her oncology and primary healthcare provider.  RECOMMENDATIONS FOR FAMILY MEMBERS:   Since she did not inherit a identifiable mutation in a cancer predisposition gene included on this panel, her children could not have inherited a known mutation from her in one of these genes. Individuals in this family might be at some increased risk of developing cancer, over the general population risk, due to the family history of cancer.  Individuals in the family should notify their providers of the family history of cancer. We recommend women in this family have a yearly mammogram  beginning at age 61, or 41 years younger than the earliest onset of cancer, an annual clinical breast exam, and perform monthly breast self-exams.  Family members should have colonoscopies by at age 53, or earlier, as recommended by their providers. We do not recommend familial testing for the MSH3 variant of uncertain significance (VUS).  FOLLOW-UP:  Lastly, we discussed with Ms. Regina Blanchard that cancer genetics is a rapidly advancing field and it is possible that new genetic tests will be appropriate for her and/or her family members in the future. We encouraged her to remain in contact with cancer genetics on an annual basis so we can update her personal and family histories and let her know of advances in cancer genetics that may benefit this family.   Our contact number was provided. Ms. Regina Blanchard questions were answered to her satisfaction, and she knows she is welcome to call us at anytime with additional questions or concerns.    Faith Rogue, MS, [facility] Genetic Counselor [email or web address] Phone: [phone]

## 2022-06-15 ENCOUNTER — Ambulatory Visit
Admission: RE | Admit: 2022-06-15 | Discharge: 2022-06-15 | Disposition: A | Discharge status: Home / Self Care | Payer: Medicare PPO | Source: Ambulatory Visit | Attending: Internal Medicine | Admitting: Internal Medicine

## 2022-06-15 DIAGNOSIS — Z1231 Encounter for screening mammogram for malignant neoplasm of breast: Secondary | ICD-10-CM | POA: Insufficient documentation

## 2022-07-06 NOTE — Progress Notes (Signed)
Pt with GI symptoms would need treatment

## 2022-07-24 ENCOUNTER — Other Ambulatory Visit: Payer: Self-pay | Admitting: Oncology

## 2022-08-17 ENCOUNTER — Ambulatory Visit
Admission: RE | Admit: 2022-08-17 | Discharge: 2022-08-17 | Disposition: A | Discharge status: Home / Self Care | Payer: Medicare PPO | Source: Ambulatory Visit | Attending: Nurse Practitioner | Admitting: Nurse Practitioner

## 2022-08-17 DIAGNOSIS — M8580 Other specified disorders of bone density and structure, unspecified site: Secondary | ICD-10-CM

## 2022-08-17 DIAGNOSIS — Z08 Encounter for follow-up examination after completed treatment for malignant neoplasm: Secondary | ICD-10-CM | POA: Diagnosis present

## 2022-08-17 DIAGNOSIS — Z853 Personal history of malignant neoplasm of breast: Secondary | ICD-10-CM | POA: Insufficient documentation

## 2022-08-17 DIAGNOSIS — Z13820 Encounter for screening for osteoporosis: Secondary | ICD-10-CM | POA: Insufficient documentation

## 2022-08-17 DIAGNOSIS — Z923 Personal history of irradiation: Secondary | ICD-10-CM | POA: Diagnosis not present

## 2022-08-17 DIAGNOSIS — M8589 Other specified disorders of bone density and structure, multiple sites: Secondary | ICD-10-CM | POA: Insufficient documentation

## 2022-09-07 IMAGING — MG MM DIGITAL SCREENING BILAT W/ TOMO AND CAD
8 series · 9 of 24 positions shown · non-contrast
Comparison: Previous exam(s).

CLINICAL DATA: Screening.

EXAM:
DIGITAL SCREENING BILATERAL MAMMOGRAM WITH TOMOSYNTHESIS AND CAD
TECHNIQUE: Bilateral screening digital craniocaudal and mediolateral oblique
mammograms were obtained. Bilateral screening digital breast
tomosynthesis was performed. The images were evaluated with
computer-aided detection.

[L CC synth-2D]
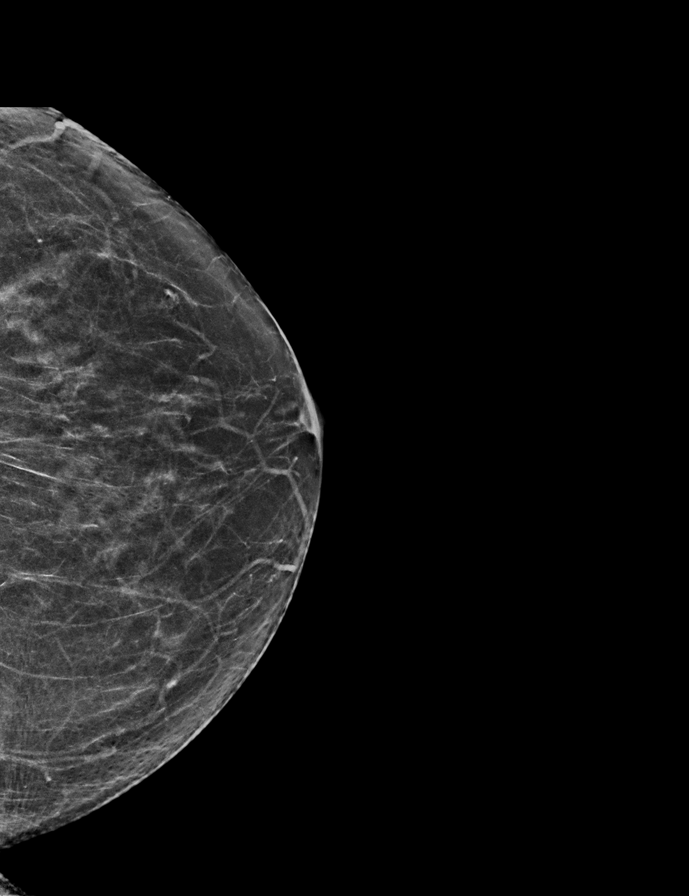

[R MLO synth-2D]
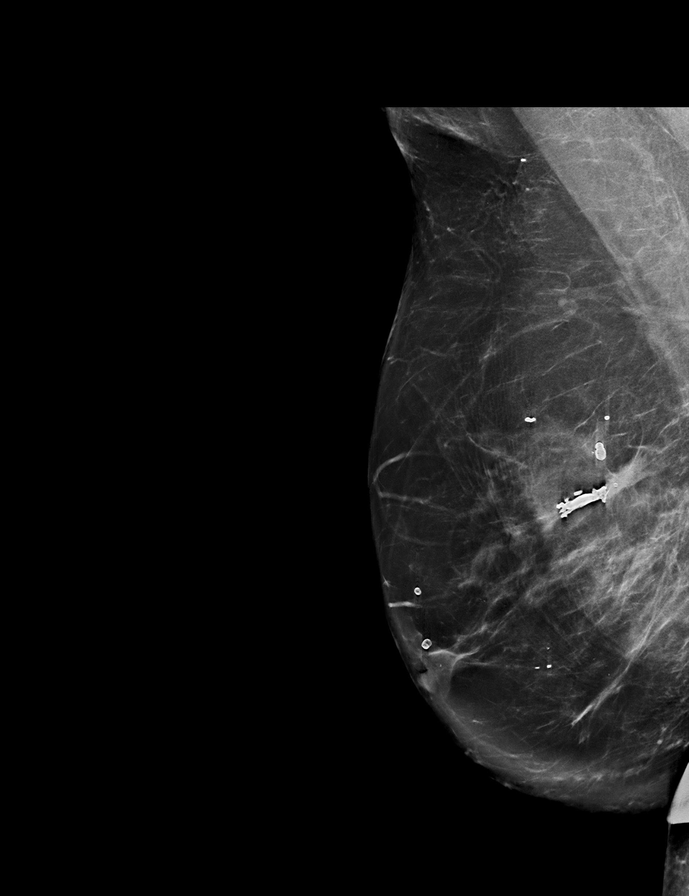

[R CC synth-2D]
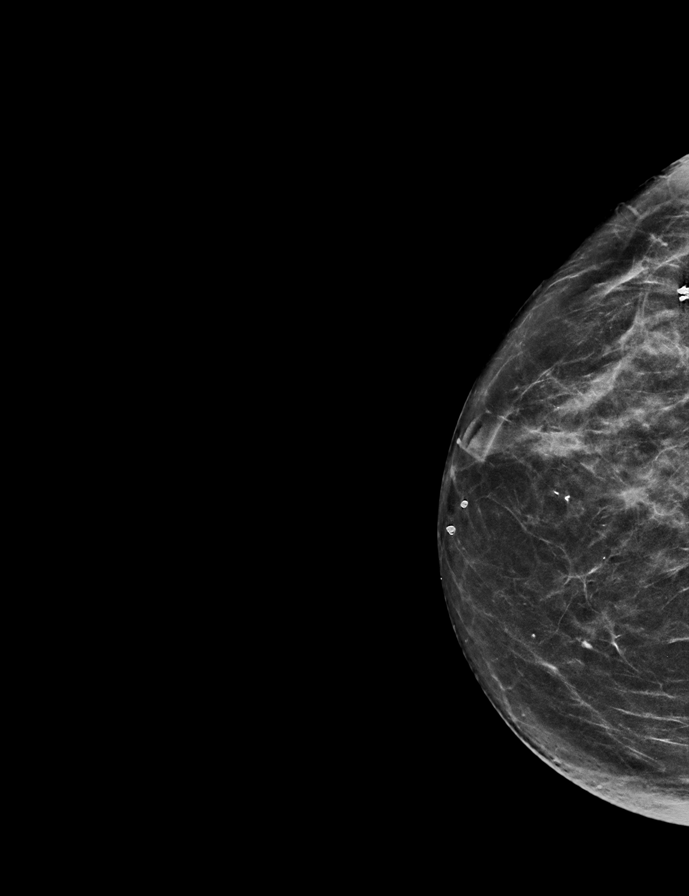

[L MLO synth-2D]
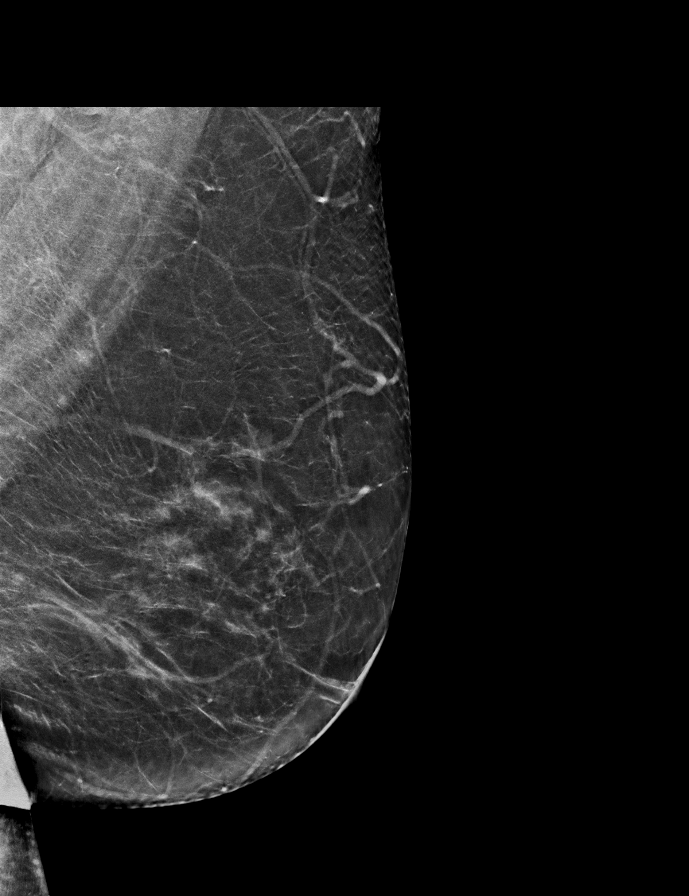

[L CC tomo · 2 of 56 frames shown]
[frame 19/56]
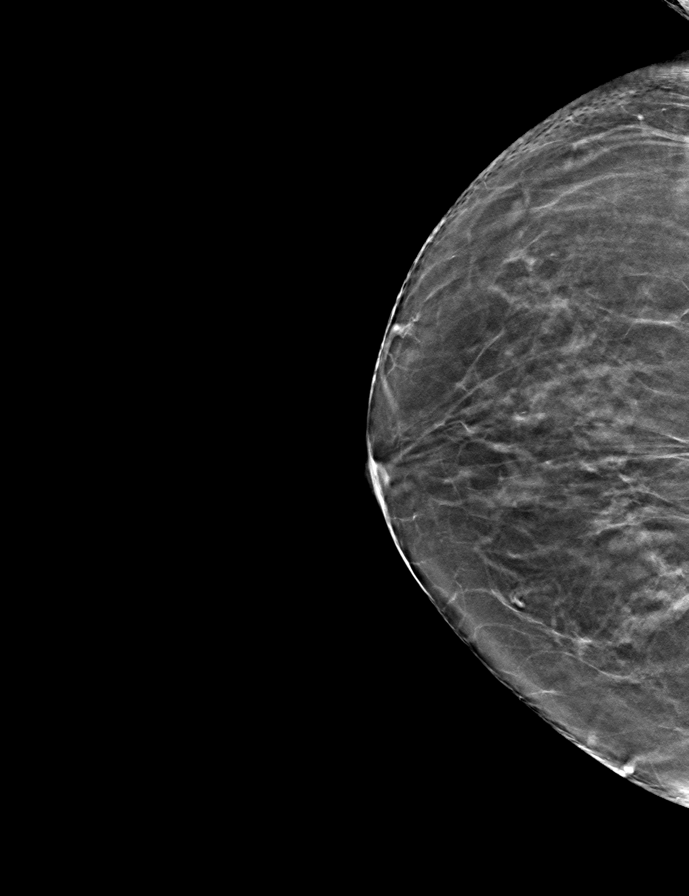
[frame 29/56]
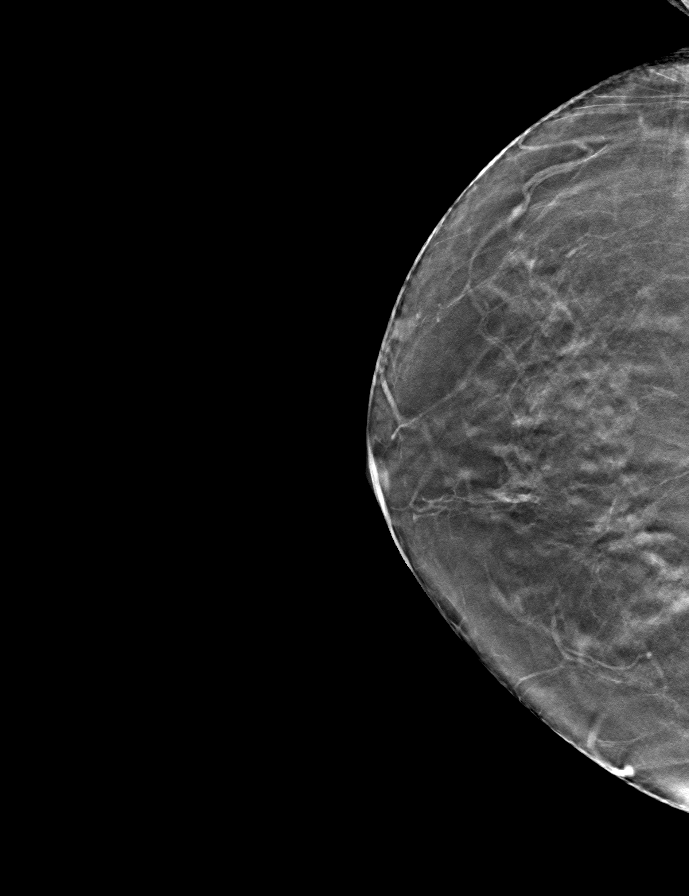

[L MLO tomo · tomo slice 35/69.0]
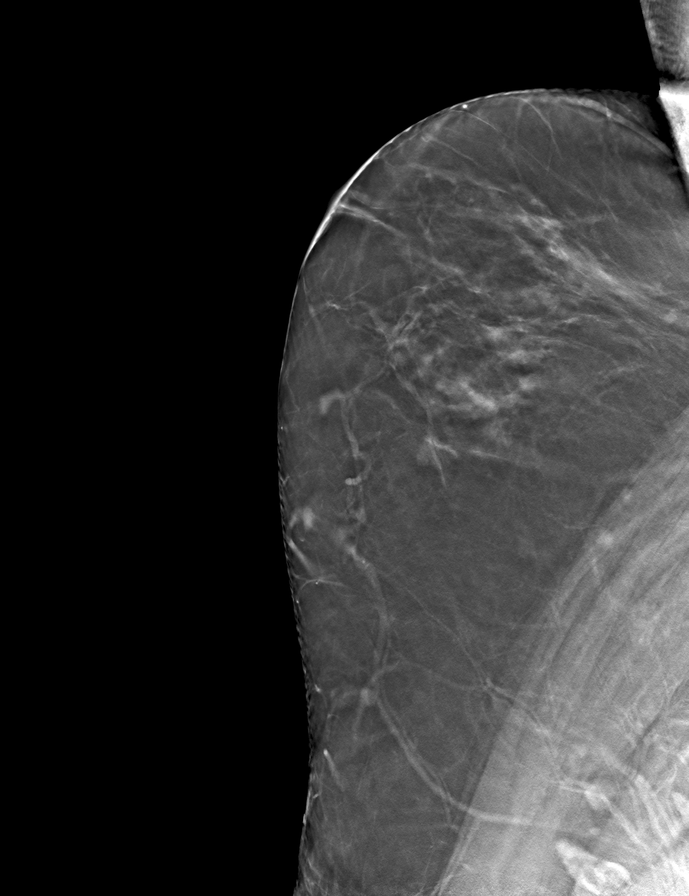

[R MLO tomo · tomo slice 41/80.0]
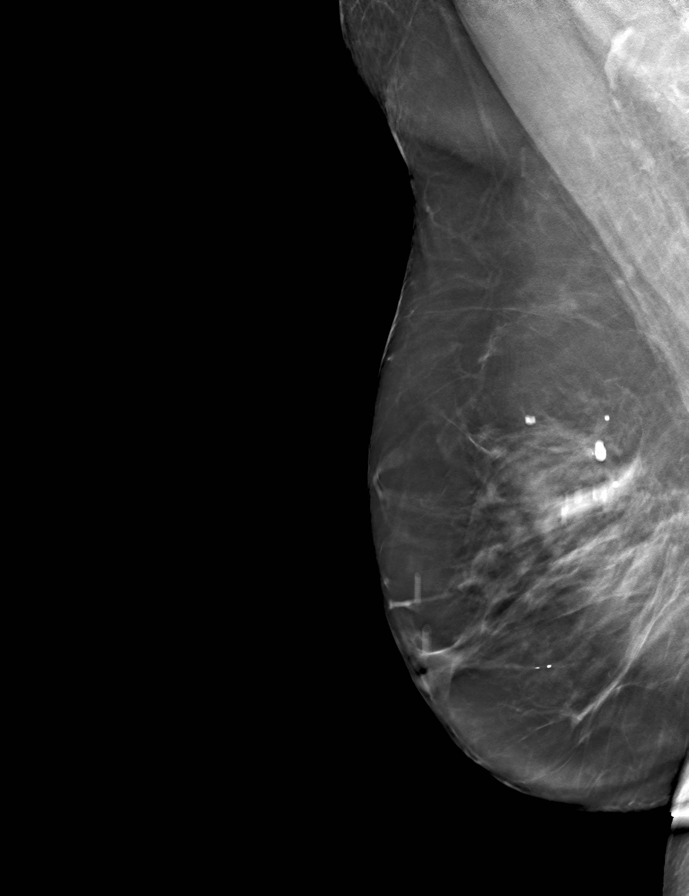

[R CC tomo · tomo slice 31/61.0]
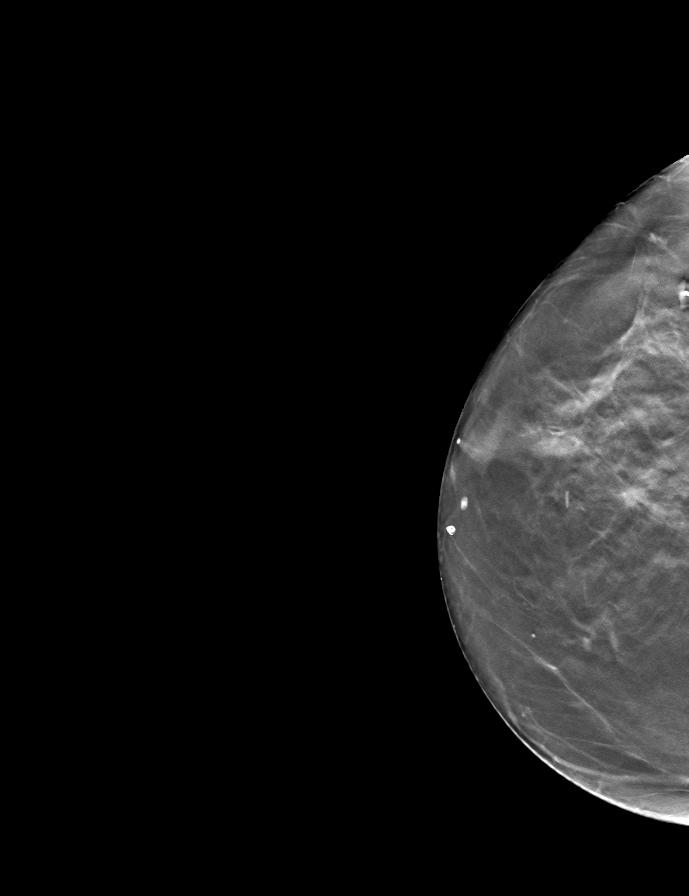

[9 of 24 positions shown; findings below may reference images not displayed]

ACR Breast Density Category b: There are scattered areas of
fibroglandular density.
FINDINGS: In the right breast, a possible asymmetry warrants further
evaluation. In the left breast, no findings suspicious for
malignancy.
IMPRESSION: Further evaluation is suggested for possible asymmetry in the right
breast.

RECOMMENDATION:
Diagnostic mammogram and possibly ultrasound of the right breast.
(Code:BU-C-IIK)

The patient will be contacted regarding the findings, and additional
imaging will be scheduled.

BI-RADS CATEGORY  0: Incomplete. Need additional imaging evaluation
and/or prior mammograms for comparison.

## 2022-11-11 ENCOUNTER — Encounter: Payer: Self-pay | Admitting: Oncology

## 2022-11-11 ENCOUNTER — Inpatient Hospital Stay: Payer: Medicare PPO | Attending: Oncology | Admitting: Oncology

## 2022-11-11 VITALS — BP 128/59 | HR 80 | Temp 97.8°F | Wt 200.0 lb

## 2022-11-11 DIAGNOSIS — Z1231 Encounter for screening mammogram for malignant neoplasm of breast: Secondary | ICD-10-CM

## 2022-11-11 DIAGNOSIS — C50911 Malignant neoplasm of unspecified site of right female breast: Secondary | ICD-10-CM

## 2022-11-11 DIAGNOSIS — Z79811 Long term (current) use of aromatase inhibitors: Secondary | ICD-10-CM | POA: Diagnosis not present

## 2022-11-11 DIAGNOSIS — Z170 Estrogen receptor positive status [ER+]: Secondary | ICD-10-CM | POA: Insufficient documentation

## 2022-11-11 DIAGNOSIS — C50411 Malignant neoplasm of upper-outer quadrant of right female breast: Secondary | ICD-10-CM | POA: Diagnosis present

## 2022-11-11 DIAGNOSIS — Z853 Personal history of malignant neoplasm of breast: Secondary | ICD-10-CM

## 2022-11-11 DIAGNOSIS — Z803 Family history of malignant neoplasm of breast: Secondary | ICD-10-CM | POA: Diagnosis not present

## 2022-11-11 DIAGNOSIS — Z800 Family history of malignant neoplasm of digestive organs: Secondary | ICD-10-CM | POA: Diagnosis not present

## 2022-11-11 DIAGNOSIS — Z801 Family history of malignant neoplasm of trachea, bronchus and lung: Secondary | ICD-10-CM | POA: Insufficient documentation

## 2022-11-11 NOTE — Progress Notes (Signed)
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  INTERVAL HISTORY: Patient returns to clinic today for routine 52-month evaluation.  She has occasional hot flashes that do not affect her day-to-day activity, but she otherwise feels well and is tolerating letrozole.  She has no neurologic complaints.  She denies any recent fevers or illnesses.  She has a good appetite and denies weight loss.  She has no chest pain, shortness of breath, cough, or hemoptysis.  She denies any nausea, vomiting, constipation, or diarrhea.  She has no urinary complaints.  Patient offers no further specific complaints today.  REVIEW OF SYSTEMS:   Review of Systems  Constitutional: Negative.  Negative for fever, malaise/fatigue and weight loss.  Respiratory: Negative.  Negative for cough and shortness of breath.   Cardiovascular: Negative.  Negative for chest pain and leg swelling.  Gastrointestinal: Negative.  Negative for abdominal pain, blood in stool and melena.  Genitourinary:  Negative for dysuria.  Musculoskeletal: Negative.  Negative for back pain.  Skin: Negative.  Negative for rash.  Neurological:  Positive for sensory change. Negative for dizziness, focal weakness, weakness and headaches.  Psychiatric/Behavioral: Negative.  The patient is not nervous/anxious.     As per HPI. Otherwise, a complete review of systems is negative.  PAST MEDICAL HISTORY: Past Medical History:  Diagnosis Date   Back pain    Breast cancer (HCC) 2004   right breast cancer   Cervical polyp    Deep vein thrombosis (DVT) of  popliteal vein of right lower extremity (HCC) 05/24/2021   Degenerative arthritis of right knee    Diverticulosis    Family history of adverse reaction to anesthesia    brother became combative in post op   Hypertension    Pneumonia    as a child "drank kerosine when a baby"   PONV (postoperative nausea and vomiting)    x1 nausea only   Status post radiation therapy    2004 breast cancer   Vitamin D deficiency     PAST SURGICAL HISTORY: Past Surgical History:  Procedure Laterality Date   BREAST BIOPSY Left    1991 negative 2006 negative   BREAST BIOPSY Bilateral    BREAST BIOPSY  04/29/2021   X2  12:00 heart-fat necrosis, 12:30 coil-[facility]   BREAST EXCISIONAL BIOPSY Right    positive 2004    BREAST LUMPECTOMY Right 2004   BREAST LUMPECTOMY,RADIO FREQ LOCALIZER,AXILLARY SENTINEL LYMPH NODE BIOPSY Right 05/22/2021   Procedure: Re-excision BREAST LUMPECTOMY,RADIO FREQ LOCALIZER,AXILLARY SENTINEL LYMPH NODE BIOPSY;  Surgeon: Sung Amabile, DO;  Location: [facility] OR[facility];  Service: General;  Laterality: Right;   COLONOSCOPY     2008, 2011, 2022   DILATION AND CURETTAGE OF UTERUS     EXPLORATORY LAPAROTOMY  1991   infertility   KNEE ARTHROPLASTY Right 07/06/2018   Procedure: COMPUTER ASSISTED TOTAL KNEE ARTHROPLASTY;  Surgeon: Donato Heinz, MD;  Location: [facility];  Service: Orthopedics;  Laterality: Right;   KNEE ARTHROPLASTY Left 02/10/2021   Procedure: COMPUTER ASSISTED TOTAL KNEE ARTHROPLASTY;  Surgeon: Donato Heinz, MD;  Location: [facility];  Service: Orthopedics;  Laterality: Left;   LEFT HEART CATH AND  CORONARY ANGIOGRAPHY Left 03/26/2022   Procedure: LEFT HEART CATH AND CORONARY ANGIOGRAPHY;  Surgeon: Alwyn Pea, MD;  Location: [facility];  Service: Cardiovascular;  Laterality: Left;   MASTECTOMY Right 05/22/2021   SQUINT REPAIR Left 1960   age 39 (CROSS EYE SURGERY)   TOTAL MASTECTOMY Right 07/29/2021   Procedure: TOTAL MASTECTOMY;  Surgeon: Sung Amabile, DO;   Location: [facility];  Service: General;  Laterality: Right;    FAMILY HISTORY: Family History  Problem Relation Age of Onset   Diabetes Mother    Heart disease Father    Diabetes Sister    Breast cancer Maternal Aunt 50   Colon cancer Maternal Aunt        dx 51s + lung cancer   Hypertension Maternal Aunt    Breast cancer Paternal Aunt        dx 1s    ADVANCED DIRECTIVES (Y/N):  N  HEALTH MAINTENANCE: Social History   Tobacco Use   Smoking status: Never   Smokeless tobacco: Never  Vaping Use   Vaping Use: Never used  Substance Use Topics   Alcohol use: No   Drug use: No     Colonoscopy:  PAP:  Bone density:  Lipid panel:  No Known Allergies  Current Outpatient Medications  Medication Sig Dispense Refill   ALPRAZolam (XANAX) 0.25 MG tablet Take 0.25 mg by mouth daily as needed for anxiety or sleep.      aspirin EC 325 MG tablet Take 325 mg by mouth daily.     B Complex-C (B-COMPLEX WITH VITAMIN C) tablet Take 1 tablet by mouth daily.     baclofen (LIORESAL) 10 MG tablet Take 10 mg by mouth daily as needed for muscle spasms.      Cholecalciferol (VITAMIN D) 50 MCG (2000 UT) CAPS Take 6,000 Units by mouth daily.     letrozole (FEMARA) 2.5 MG tablet TAKE 1 TABLET(2.5 MG) BY MOUTH DAILY 90 tablet 3   nitrofurantoin, macrocrystal-monohydrate, (MACROBID) 100 MG capsule Take 100 mg by mouth.     tiZANidine (ZANAFLEX) 4 MG tablet Take 4 mg by mouth 2 (two) times daily as needed for muscle spasms.     ZETIA 10 MG tablet Take 10 mg by mouth daily.     amoxicillin (AMOXIL) 500 MG capsule  (Patient not taking: Reported on 11/11/2022)     olmesartan-hydrochlorothiazide (BENICAR HCT) 20-12.5 MG tablet Take 0.5 tablets by mouth daily. Hold for low blood pressure. (Patient not taking: Reported on 11/11/2022)     rosuvastatin (CRESTOR) 40 MG tablet Take 20 mg by mouth. (Patient not taking: Reported on 11/11/2022)     No current facility-administered medications for this visit.     OBJECTIVE: Vitals:   11/11/22 1106  BP: (!) 128/59  Pulse: 80  Temp: 97.8 F (36.6 C)  SpO2: 100%     Body mass index is 30.41 kg/m.    ECOG FS:0 - Asymptomatic  General: Well-developed, well-nourished, no acute distress. Eyes: Pink conjunctiva, anicteric sclera. HEENT: Normocephalic, moist mucous membranes. Breast: Right mastectomy. Lungs: No audible wheezing or coughing. Heart: Regular rate and rhythm. Abdomen: Soft, nontender, no obvious distention. Musculoskeletal: No edema, cyanosis, or clubbing. Neuro: Alert, answering all questions appropriately. Cranial nerves grossly intact. Skin: No rashes or petechiae noted. Psych: Normal affect.   LAB RESULTS:  Lab Results  Component Value Date   NA 139 06/01/2022   K 3.4 (L) 06/01/2022   CL 102 06/01/2022   CO2 27 06/01/2022  GLUCOSE 149 (H) 06/01/2022   BUN 11 06/01/2022   CREATININE 0.84 06/01/2022   CALCIUM 9.4 06/01/2022   PROT 7.2 06/01/2022   ALBUMIN 4.1 06/01/2022   AST 20 06/01/2022   ALT 16 06/01/2022   ALKPHOS 102 06/01/2022   BILITOT 1.2 06/01/2022   GFRNONAA >60 06/01/2022   GFRAA >60 06/22/2018    Lab Results  Component Value Date   WBC 14.5 (H) 06/01/2022   NEUTROABS 12.5 (H) 06/01/2022   HGB 12.6 06/01/2022   HCT 40.1 06/01/2022   MCV 93.9 06/01/2022   PLT 246 06/01/2022     STUDIES: No results found.  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  Oncotype DX score low risk of 18.  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  She then waited until Eliquis was discontinued to have her total mastectomy which occurred on July 29, 2021.  She has a low risk Oncotype score, therefore adjuvant chemotherapy was not needed.  Because she underwent mastectomy, she did not  require adjuvant XRT.  Continue letrozole for a total of 5 years completing treatment in March 2028.  Patient's most recent left unilateral screening mammogram on June 14, 2022 was reported BI-RADS 1.  Repeat in January 2025 with follow-up 2 to 3 days later.   Genetic testing: Patient does not have any biologic children.  A referral was previously sent to genetics. 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 Bone health: Patient's most recent bone mineral density on August 17, 2022 was reported -1.7 which is unchanged from 1 year prior.  Continue calcium and vitamin D supplementation.  Repeat in March 2025.    Patient expressed understanding and was in agreement with this plan. She also understands that She can call clinic at any time with any questions, concerns, or complaints.    Cancer Staging  Invasive ductal carcinoma of right breast [facility]) Staging form: Breast, AJCC 8th Edition - Pathologic stage from 06/06/2021: Stage IA (pT1b, pN0, cM0, G2, ER+, PR+, HER2-) - Signed by Jeralyn Ruths, MD on 06/06/2021 Stage prefix: Initial diagnosis Histologic grading system: 3 grade system  Jeralyn Ruths, MD   11/11/2022 12:45 PM

## 2023-06-17 ENCOUNTER — Ambulatory Visit
Admission: RE | Admit: 2023-06-17 | Discharge: 2023-06-17 | Disposition: A | Discharge status: Home / Self Care | Payer: Medicare PPO | Source: Ambulatory Visit | Attending: Oncology | Admitting: Oncology

## 2023-06-17 DIAGNOSIS — Z853 Personal history of malignant neoplasm of breast: Secondary | ICD-10-CM | POA: Insufficient documentation

## 2023-06-17 DIAGNOSIS — C50911 Malignant neoplasm of unspecified site of right female breast: Secondary | ICD-10-CM | POA: Diagnosis present

## 2023-06-17 DIAGNOSIS — Z1231 Encounter for screening mammogram for malignant neoplasm of breast: Secondary | ICD-10-CM | POA: Insufficient documentation

## 2023-06-23 ENCOUNTER — Encounter: Payer: Self-pay | Admitting: Oncology

## 2023-06-23 ENCOUNTER — Inpatient Hospital Stay: Payer: Medicare PPO | Attending: Oncology | Admitting: Oncology

## 2023-06-23 VITALS — BP 124/52 | HR 76 | Temp 98.1°F | Resp 16 | Ht 68.0 in | Wt 205.8 lb

## 2023-06-23 DIAGNOSIS — Z170 Estrogen receptor positive status [ER+]: Secondary | ICD-10-CM | POA: Diagnosis not present

## 2023-06-23 DIAGNOSIS — Z79811 Long term (current) use of aromatase inhibitors: Secondary | ICD-10-CM | POA: Insufficient documentation

## 2023-06-23 DIAGNOSIS — Z803 Family history of malignant neoplasm of breast: Secondary | ICD-10-CM | POA: Diagnosis not present

## 2023-06-23 DIAGNOSIS — C50411 Malignant neoplasm of upper-outer quadrant of right female breast: Secondary | ICD-10-CM | POA: Diagnosis present

## 2023-06-23 DIAGNOSIS — Z08 Encounter for follow-up examination after completed treatment for malignant neoplasm: Secondary | ICD-10-CM | POA: Diagnosis not present

## 2023-06-23 DIAGNOSIS — Z801 Family history of malignant neoplasm of trachea, bronchus and lung: Secondary | ICD-10-CM | POA: Insufficient documentation

## 2023-06-23 DIAGNOSIS — C50911 Malignant neoplasm of unspecified site of right female breast: Secondary | ICD-10-CM

## 2023-06-23 DIAGNOSIS — Z1721 Progesterone receptor positive status: Secondary | ICD-10-CM | POA: Insufficient documentation

## 2023-06-23 DIAGNOSIS — Z9011 Acquired absence of right breast and nipple: Secondary | ICD-10-CM | POA: Diagnosis not present

## 2023-06-23 DIAGNOSIS — Z86000 Personal history of in-situ neoplasm of breast: Secondary | ICD-10-CM | POA: Diagnosis not present

## 2023-06-23 DIAGNOSIS — M8580 Other specified disorders of bone density and structure, unspecified site: Secondary | ICD-10-CM | POA: Diagnosis not present

## 2023-06-23 DIAGNOSIS — Z1732 Human epidermal growth factor receptor 2 negative status: Secondary | ICD-10-CM | POA: Insufficient documentation

## 2023-06-23 DIAGNOSIS — Z800 Family history of malignant neoplasm of digestive organs: Secondary | ICD-10-CM | POA: Diagnosis not present

## 2023-06-23 DIAGNOSIS — Z5181 Encounter for therapeutic drug level monitoring: Secondary | ICD-10-CM

## 2023-06-23 NOTE — Progress Notes (Signed)
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  INTERVAL HISTORY: Patient returns to clinic today for routine 37-month evaluation.  She currently feels well and is asymptomatic.  She continues to have occasional hot flashes that do not affect her day-to-day activity, but otherwise is tolerating letrozole without significant side effects. She has no neurologic complaints.  She denies any recent fevers or illnesses.  She has a good appetite and denies weight loss.  She has no chest pain, shortness of breath, cough, or hemoptysis.  She denies any nausea, vomiting, constipation, or diarrhea.  She has no urinary complaints.  Patient offers no further specific complaints today.  REVIEW OF SYSTEMS:   Review of Systems  Constitutional: Negative.  Negative for fever, malaise/fatigue and weight loss.  Respiratory: Negative.  Negative for cough and shortness of breath.   Cardiovascular: Negative.  Negative for chest pain and leg swelling.  Gastrointestinal: Negative.  Negative for abdominal pain, blood in stool and melena.  Genitourinary:  Negative for dysuria.  Musculoskeletal: Negative.  Negative for back pain.  Skin: Negative.  Negative for rash.  Neurological:  Positive for sensory change. Negative for dizziness, focal weakness, weakness and headaches.  Psychiatric/Behavioral: Negative.  The patient is not nervous/anxious.     As per HPI. Otherwise, a complete review of systems is negative.  PAST MEDICAL HISTORY: Past Medical History:  Diagnosis Date   Back pain    Breast cancer (HCC) 2004   right  breast cancer   Cervical polyp    Deep vein thrombosis (DVT) of popliteal vein of right lower extremity (HCC) 05/24/2021   Degenerative arthritis of right knee    Diverticulosis    Family history of adverse reaction to anesthesia    brother became combative in post op   Hypertension    Pneumonia    as a child "drank kerosine when a baby"   PONV (postoperative nausea and vomiting)    x1 nausea only   Status post radiation therapy    2004 breast cancer   Vitamin D deficiency     PAST SURGICAL HISTORY: Past Surgical History:  Procedure Laterality Date   BREAST BIOPSY Left    1991 negative 2006 negative   BREAST BIOPSY Bilateral    BREAST BIOPSY  04/29/2021   X2  12:00 heart-fat necrosis, 12:30 coil-[facility]   BREAST EXCISIONAL BIOPSY Right    positive 2004    BREAST LUMPECTOMY Right 2004   BREAST LUMPECTOMY,RADIO FREQ LOCALIZER,AXILLARY SENTINEL LYMPH NODE BIOPSY Right 05/22/2021   Procedure: Re-excision BREAST LUMPECTOMY,RADIO FREQ LOCALIZER,AXILLARY SENTINEL LYMPH NODE BIOPSY;  Surgeon: Sung Amabile, DO;  Location: [facility];  Service: General;  Laterality: Right;   COLONOSCOPY     2008, 2011, 2022   DILATION AND CURETTAGE OF UTERUS     EXPLORATORY LAPAROTOMY  1991   infertility   KNEE ARTHROPLASTY Right 07/06/2018   Procedure: COMPUTER ASSISTED TOTAL KNEE ARTHROPLASTY;  Surgeon: Donato Heinz, MD;  Location: [facility];  Service: Orthopedics;  Laterality: Right;   KNEE ARTHROPLASTY Left 02/10/2021   Procedure: COMPUTER ASSISTED TOTAL KNEE ARTHROPLASTY;  Surgeon: Donato Heinz, MD;  Location: [facility];  Service: Orthopedics;  Laterality: Left;   LEFT HEART CATH AND CORONARY ANGIOGRAPHY Left 03/26/2022   Procedure: LEFT HEART CATH AND CORONARY ANGIOGRAPHY;  Surgeon: Alwyn Pea, MD;  Location: [facility];  Service: Cardiovascular;  Laterality: Left;   MASTECTOMY Right 05/22/2021   SQUINT REPAIR Left 1960   age 6 (CROSS EYE SURGERY)   TOTAL MASTECTOMY Right  07/29/2021   Procedure: TOTAL MASTECTOMY;  Surgeon: Sung Amabile, DO;  Location: [facility];  Service: General;  Laterality: Right;    FAMILY HISTORY: Family History  Problem Relation Age of Onset   Diabetes Mother    Heart disease Father    Diabetes Sister    Breast cancer Maternal Aunt 50   Colon cancer Maternal Aunt        dx 42s + lung cancer   Hypertension Maternal Aunt    Breast cancer Paternal Aunt        dx 28s    ADVANCED DIRECTIVES (Y/N):  N  HEALTH MAINTENANCE: Social History   Tobacco Use   Smoking status: Never   Smokeless tobacco: Never  Vaping Use   Vaping status: Never Used  Substance Use Topics   Alcohol use: No   Drug use: No     Colonoscopy:  PAP:  Bone density:  Lipid panel:  No Known Allergies  Current Outpatient Medications  Medication Sig Dispense Refill   ALPRAZolam (XANAX) 0.25 MG tablet Take 0.25 mg by mouth daily as needed for anxiety or sleep.      amoxicillin (AMOXIL) 500 MG capsule  (Patient not taking: Reported on 11/11/2022)     aspirin EC 325 MG tablet Take 325 mg by mouth daily.     B Complex-C (B-COMPLEX WITH VITAMIN C) tablet Take 1 tablet by mouth daily.     baclofen (LIORESAL) 10 MG tablet Take 10 mg by mouth daily as needed for muscle spasms.      Cholecalciferol (VITAMIN D) 50 MCG (2000 UT) CAPS Take 6,000 Units by mouth daily.     letrozole (FEMARA) 2.5 MG tablet TAKE 1 TABLET(2.5 MG) BY MOUTH DAILY 90 tablet 3   olmesartan-hydrochlorothiazide (BENICAR HCT) 20-12.5 MG tablet Take 0.5 tablets by mouth daily. Hold for low blood pressure. (Patient not taking: Reported on 11/11/2022)     tiZANidine (ZANAFLEX) 4 MG tablet Take 4 mg by mouth 2 (two) times daily as needed for muscle spasms. (Patient not taking: Reported on 06/23/2023)     ZETIA 10 MG tablet Take 10 mg by mouth daily.     No current facility-administered medications for this visit.    OBJECTIVE: Vitals:   06/23/23 1006  BP: (!) 124/52  Pulse: 76  Resp: 16   Temp: 98.1 F (36.7 C)  SpO2: 98%     Body mass index is 31.29 kg/m.    ECOG FS:0 - Asymptomatic  General: Well-developed, well-nourished, no acute distress. Eyes: Pink conjunctiva, anicteric sclera. HEENT: Normocephalic, moist mucous membranes. Breast: Right mastectomy. Lungs: No audible wheezing or coughing. Heart: Regular rate and rhythm. Abdomen: Soft, nontender, no obvious distention. Musculoskeletal: No edema, cyanosis, or clubbing. Neuro: Alert, answering all questions appropriately. Cranial nerves grossly intact. Skin: No rashes or petechiae noted. Psych: Normal affect.  LAB RESULTS:  Lab Results  Component Value Date   NA 139 06/01/2022   K 3.4 (L) 06/01/2022   CL 102 06/01/2022   CO2 27 06/01/2022   GLUCOSE 149 (H) 06/01/2022   BUN 11 06/01/2022   CREATININE 0.84 06/01/2022   CALCIUM 9.4 06/01/2022  PROT 7.2 06/01/2022   ALBUMIN 4.1 06/01/2022   AST 20 06/01/2022   ALT 16 06/01/2022   ALKPHOS 102 06/01/2022   BILITOT 1.2 06/01/2022   GFRNONAA >60 06/01/2022   GFRAA >60 06/22/2018    Lab Results  Component Value Date   WBC 14.5 (H) 06/01/2022   NEUTROABS 12.5 (H) 06/01/2022   HGB 12.6 06/01/2022   HCT 40.1 06/01/2022   MCV 93.9 06/01/2022   PLT 246 06/01/2022     STUDIES: MM 3D SCREENING MAMMOGRAM UNILATERAL LEFT BREAST Result Date: 06/18/2023 CLINICAL DATA:  Screening. EXAM: DIGITAL SCREENING UNILATERAL LEFT MAMMOGRAM WITH CAD AND TOMOSYNTHESIS TECHNIQUE: Left screening digital craniocaudal and mediolateral oblique mammograms were obtained. Left screening digital breast tomosynthesis was performed. The images were evaluated with computer-aided detection. COMPARISON:  Previous exam(s). ACR Breast Density Category b: There are scattered areas of fibroglandular density. FINDINGS: The patient has had a right mastectomy. There are no findings suspicious for malignancy. IMPRESSION: No mammographic evidence of malignancy. A result letter of this screening  mammogram will be mailed directly to the patient. RECOMMENDATION: Screening mammogram in one year.  (Code:[id number]-L-48M) BI-RADS CATEGORY  1: Negative. Electronically Signed   By: Frederico Hamman M.D.   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  Oncotype DX score low risk of 18.  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  She then waited until Eliquis was discontinued to have her total mastectomy which occurred on July 29, 2021.  She has a low risk Oncotype score, therefore adjuvant chemotherapy was not needed.  Because she underwent mastectomy, she did not require adjuvant XRT.  Continue letrozole for a total of 5 years completing treatment in March 2028.  Patient's most recent left unilateral screening mammogram on June 17, 2023 was reported BI-RADS 1.  Repeat in January 2026.  Patient has requested less frequent follow-up, therefore she will return to clinic in 1 year with screening mammogram and further evaluation.    Genetic testing: Patient does not have any biologic children.  A referral was previously sent to genetics. 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 Bone health: Patient's most recent bone mineral density on August 17, 2022 was reported -1.7 which is unchanged from 1 year prior.  Continue calcium and vitamin D supplementation.  Repeat in March 2025.  I spent a total of 20 minutes reviewing chart data, face-to-face evaluation with the patient, counseling and coordination of care as detailed above.   Patient expressed understanding and was in agreement with this plan. She also understands that She can call clinic at any time with any questions, concerns, or complaints.    Cancer Staging  Invasive ductal carcinoma of right  breast [facility]) Staging form: Breast, AJCC 8th Edition - Pathologic stage from 06/06/2021: Stage IA (pT1b, pN0, cM0, G2, ER+, PR+, HER2-) - Signed by Jeralyn Ruths, MD on 06/06/2021 Stage prefix: Initial diagnosis Histologic grading system: 3 grade system  Jeralyn Ruths, MD   06/23/2023 10:32 AM

## 2023-07-29 ENCOUNTER — Telehealth: Payer: Self-pay | Admitting: *Deleted

## 2023-07-29 ENCOUNTER — Other Ambulatory Visit: Payer: Self-pay | Admitting: Oncology

## 2023-07-29 NOTE — Telephone Encounter (Signed)
 Pt.wants to have refill letrozole

## 2023-07-29 NOTE — Telephone Encounter (Signed)
 Called the pt and let her know that the  order put in and finnegan will approve by the end of the day

## 2023-08-18 ENCOUNTER — Ambulatory Visit
Admission: RE | Admit: 2023-08-18 | Discharge: 2023-08-18 | Disposition: A | Discharge status: Home / Self Care | Payer: Medicare PPO | Source: Ambulatory Visit | Attending: Oncology | Admitting: Oncology

## 2023-08-18 DIAGNOSIS — M8580 Other specified disorders of bone density and structure, unspecified site: Secondary | ICD-10-CM

## 2023-08-18 DIAGNOSIS — Z5181 Encounter for therapeutic drug level monitoring: Secondary | ICD-10-CM | POA: Diagnosis present

## 2023-08-18 DIAGNOSIS — Z79811 Long term (current) use of aromatase inhibitors: Secondary | ICD-10-CM | POA: Insufficient documentation

## 2024-06-19 ENCOUNTER — Encounter

## 2024-06-22 ENCOUNTER — Encounter: Payer: Self-pay | Admitting: Oncology

## 2024-06-22 ENCOUNTER — Ambulatory Visit
Admission: RE | Admit: 2024-06-22 | Discharge: 2024-06-22 | Disposition: A | Discharge status: Home / Self Care | Source: Ambulatory Visit | Attending: Oncology | Admitting: Oncology

## 2024-06-22 ENCOUNTER — Encounter

## 2024-06-22 ENCOUNTER — Inpatient Hospital Stay: Payer: Medicare PPO | Admitting: Oncology

## 2024-06-22 VITALS — BP 114/62 | HR 75 | Temp 97.0°F | Resp 20 | Wt 210.4 lb

## 2024-06-22 DIAGNOSIS — C50911 Malignant neoplasm of unspecified site of right female breast: Secondary | ICD-10-CM | POA: Diagnosis present

## 2024-06-22 DIAGNOSIS — C50411 Malignant neoplasm of upper-outer quadrant of right female breast: Secondary | ICD-10-CM | POA: Insufficient documentation

## 2024-06-22 DIAGNOSIS — Z800 Family history of malignant neoplasm of digestive organs: Secondary | ICD-10-CM | POA: Insufficient documentation

## 2024-06-22 DIAGNOSIS — Z801 Family history of malignant neoplasm of trachea, bronchus and lung: Secondary | ICD-10-CM | POA: Insufficient documentation

## 2024-06-22 DIAGNOSIS — Z79811 Long term (current) use of aromatase inhibitors: Secondary | ICD-10-CM | POA: Insufficient documentation

## 2024-06-22 DIAGNOSIS — Z8701 Personal history of pneumonia (recurrent): Secondary | ICD-10-CM | POA: Insufficient documentation

## 2024-06-22 DIAGNOSIS — Z1231 Encounter for screening mammogram for malignant neoplasm of breast: Secondary | ICD-10-CM | POA: Diagnosis not present

## 2024-06-22 DIAGNOSIS — Z170 Estrogen receptor positive status [ER+]: Secondary | ICD-10-CM | POA: Diagnosis present

## 2024-06-22 DIAGNOSIS — Z923 Personal history of irradiation: Secondary | ICD-10-CM | POA: Diagnosis not present

## 2024-06-22 DIAGNOSIS — Z1721 Progesterone receptor positive status: Secondary | ICD-10-CM | POA: Diagnosis not present

## 2024-06-22 DIAGNOSIS — Z7982 Long term (current) use of aspirin: Secondary | ICD-10-CM | POA: Diagnosis not present

## 2024-06-22 DIAGNOSIS — Z1732 Human epidermal growth factor receptor 2 negative status: Secondary | ICD-10-CM | POA: Diagnosis not present

## 2024-06-22 MED ORDER — LETROZOLE 2.5 MG PO TABS: 2.5000 mg | ORAL_TABLET | Freq: Every day | ORAL | 3 refills | Status: AC

## 2024-06-22 NOTE — Progress Notes (Signed)
 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  INTERVAL HISTORY: Returns to clinic today for routine yearly evaluation.  She continues to have occasional hot flashes with letrozole , but otherwise is tolerating treatments well.  She currently feels well and is asymptomatic.  Her mammogram was scheduled to later today secondary to weather.  She has no neurologic complaints.  She denies any recent fevers or illnesses.  She has a good appetite and denies weight loss.  She has no chest pain, shortness of breath, cough, or hemoptysis.  She denies any nausea, vomiting, constipation, or diarrhea.  She has no urinary complaints.  Patient offers no specific complaints today.  REVIEW OF SYSTEMS:   Review of Systems  Constitutional: Negative.  Negative for fever, malaise/fatigue and weight loss.  Respiratory: Negative.  Negative for cough and shortness of breath.   Cardiovascular: Negative.  Negative for chest pain and leg swelling.  Gastrointestinal: Negative.  Negative for abdominal pain, blood in stool and melena.  Genitourinary:  Negative for dysuria.  Musculoskeletal: Negative.  Negative for back pain.  Skin: Negative.  Negative for rash.  Neurological:  Positive for sensory change. Negative for dizziness, focal weakness, weakness and headaches.  Psychiatric/Behavioral: Negative.  The patient is not nervous/anxious.     As per HPI. Otherwise, a complete review of systems is negative.  PAST MEDICAL HISTORY: Past Medical History:  Diagnosis Date   Back pain    Breast cancer (HCC) 2004   right  breast cancer   Cervical polyp    Deep vein thrombosis (DVT) of popliteal vein of right lower extremity (HCC) 05/24/2021   Degenerative arthritis of right knee    Diverticulosis    Family history of adverse reaction to anesthesia    brother became combative in post op   Hypertension    Pneumonia    as a child drank kerosine when a baby   PONV (postoperative nausea and vomiting)    x1 nausea only   Status post radiation therapy    2004 breast cancer   Vitamin D  deficiency     PAST SURGICAL HISTORY: Past Surgical History:  Procedure Laterality Date   BREAST BIOPSY Left    1991 negative 2006 negative   BREAST BIOPSY Bilateral    BREAST BIOPSY  04/29/2021   X2  12:00 heart-fat necrosis, 12:30 coil-[facility]   BREAST EXCISIONAL BIOPSY Right    positive 2004    BREAST LUMPECTOMY Right 2004   BREAST LUMPECTOMY,RADIO FREQ LOCALIZER,AXILLARY SENTINEL LYMPH NODE BIOPSY Right 05/22/2021   Procedure: Re-excision BREAST LUMPECTOMY,RADIO FREQ LOCALIZER,AXILLARY SENTINEL LYMPH NODE BIOPSY;  Surgeon: Tye Millet, DO;  Location: [facility] ORS;  Service: General;  Laterality: Right;   COLONOSCOPY     2008, 2011, 2022   DILATION AND CURETTAGE OF UTERUS     EXPLORATORY LAPAROTOMY  1991   infertility   KNEE ARTHROPLASTY Right 07/06/2018   Procedure: COMPUTER ASSISTED TOTAL KNEE ARTHROPLASTY;  Surgeon: Mardee Lynwood SQUIBB, MD;  Location: [facility] OR[facility];  Service: Orthopedics;  Laterality: Right;   KNEE ARTHROPLASTY Left 02/10/2021   Procedure: COMPUTER ASSISTED TOTAL KNEE ARTHROPLASTY;  Surgeon: Mardee Lynwood SQUIBB, MD;  Location: [facility];  Service: Orthopedics;  Laterality: Left;   LEFT HEART CATH AND CORONARY ANGIOGRAPHY Left 03/26/2022   Procedure: LEFT HEART CATH AND CORONARY ANGIOGRAPHY;  Surgeon: Florencio Cara BIRCH, MD;  Location: [facility];  Service: Cardiovascular;  Laterality: Left;   MASTECTOMY Right 05/22/2021   SQUINT REPAIR Left 1960   age 74 (CROSS EYE SURGERY)   TOTAL MASTECTOMY Right  07/29/2021   Procedure: TOTAL MASTECTOMY;  Surgeon: Tye Millet, DO;  Location: [facility];  Service: General;  Laterality: Right;    FAMILY HISTORY: Family History  Problem Relation Age of Onset   Diabetes Mother    Heart disease Father    Diabetes Sister    Breast cancer Maternal Aunt 50   Colon cancer Maternal Aunt        dx 29s + lung cancer   Hypertension Maternal Aunt    Breast cancer Paternal Aunt        dx 26s    ADVANCED DIRECTIVES (Y/N):  N  HEALTH MAINTENANCE: Social History   Tobacco Use   Smoking status: Never   Smokeless tobacco: Never  Vaping Use   Vaping status: Never Used  Substance Use Topics   Alcohol use: No   Drug use: No     Colonoscopy:  PAP:  Bone density:  Lipid panel:  No Known Allergies  Current Outpatient Medications  Medication Sig Dispense Refill   ALPRAZolam  (XANAX ) 0.25 MG tablet Take 0.25 mg by mouth daily as needed for anxiety or sleep.      aspirin  EC 325 MG tablet Take 325 mg by mouth daily.     B Complex-C (B-COMPLEX WITH VITAMIN C) tablet Take 1 tablet by mouth daily.     baclofen  (LIORESAL ) 10 MG tablet Take 10 mg by mouth daily as needed for muscle spasms.      Cholecalciferol  (VITAMIN D ) 50 MCG (2000 UT) CAPS Take 6,000 Units by mouth daily.     rosuvastatin  (CRESTOR ) 10 MG tablet Take by mouth.     ZETIA 10 MG tablet Take 10 mg by mouth daily.     amoxicillin  (AMOXIL ) 500 MG capsule  (Patient not taking: Reported on 06/22/2024)     letrozole  (FEMARA ) 2.5 MG tablet Take 1 tablet (2.5 mg total) by mouth daily. 90 tablet 3   olmesartan -hydrochlorothiazide  (BENICAR  HCT) 20-12.5 MG tablet Take 0.5 tablets by mouth daily. Hold for low blood pressure. (Patient not taking: Reported on 06/22/2024)     tiZANidine  (ZANAFLEX ) 4 MG tablet Take 4 mg by mouth 2 (two) times daily as needed for muscle spasms. (Patient not taking: Reported on 06/22/2024)     No current facility-administered medications for this visit.    OBJECTIVE: Vitals:    06/22/24 1025  BP: 114/62  Pulse: 75  Resp: 20  Temp: (!) 97 F (36.1 C)  SpO2: 100%     Body mass index is 31.99 kg/m.    ECOG FS:0 - Asymptomatic  General: Well-developed, well-nourished, no acute distress. Eyes: Pink conjunctiva, anicteric sclera. HEENT: Normocephalic, moist mucous membranes. Breast: Right mastectomy. Lungs: No audible wheezing or coughing. Heart: Regular rate and rhythm. Abdomen: Soft, nontender, no obvious distention. Musculoskeletal: No edema, cyanosis, or clubbing. Neuro: Alert, answering all questions appropriately. Cranial nerves grossly intact. Skin: No rashes or petechiae noted. Psych: Normal affect.  LAB RESULTS:  Lab Results  Component Value Date   NA 139 06/01/2022   K 3.4 (L) 06/01/2022   CL 102 06/01/2022   CO2 27 06/01/2022   GLUCOSE 149 (  H) 06/01/2022   BUN 11 06/01/2022   CREATININE 0.84 06/01/2022   CALCIUM  9.4 06/01/2022   PROT 7.2 06/01/2022   ALBUMIN 4.1 06/01/2022   AST 20 06/01/2022   ALT 16 06/01/2022   ALKPHOS 102 06/01/2022   BILITOT 1.2 06/01/2022   GFRNONAA >60 06/01/2022   GFRAA >60 06/22/2018    Lab Results  Component Value Date   WBC 14.5 (H) 06/01/2022   NEUTROABS 12.5 (H) 06/01/2022   HGB 12.6 06/01/2022   HCT 40.1 06/01/2022   MCV 93.9 06/01/2022   PLT 246 06/01/2022     STUDIES: No results found.   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  Oncotype DX score low risk of 18.  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  She then waited until Eliquis  was discontinued to have her total mastectomy which occurred on July 29, 2021.  She has a low risk Oncotype score, therefore adjuvant chemotherapy was not needed.  Because she underwent mastectomy, she did not require adjuvant XRT.   Continue letrozole  for a total of 5 years completing treatment in March 2028.  Patient has a mammogram scheduled for later today.  Patient continues to request less frequent follow-up, therefore she will return to clinic in 1 year with left breast screening mammogram and further evaluation. Genetic testing: Patient does not have any biologic children.  A referral was previously sent to genetics. 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 Bone health: Patient's most recent bone mineral density on August 18, 2023 revealed a T-score of -1.7 which is unchanged from previous.  Continue calcium  and vitamin D  supplementation.  Patient requested future bone marrow densities being monitored by primary care.  Repeat in March 2026.  I spent a total of 20 minutes reviewing chart data, face-to-face evaluation with the patient, counseling and coordination of care as detailed above.   Patient expressed understanding and was in agreement with this plan. She also understands that She can call clinic at any time with any questions, concerns, or complaints.    Cancer Staging  Invasive ductal carcinoma of right breast [facility]) Staging form: Breast, AJCC 8th Edition - Pathologic stage from 06/06/2021: Stage IA (pT1b, pN0, cM0, G2, ER+, PR+, HER2-) - Signed by Jacobo Evalene PARAS, MD on 06/06/2021 Stage prefix: Initial diagnosis Histologic grading system: 3 grade system  Evalene PARAS Jacobo, MD   06/22/2024 10:55 AM

## 2024-06-22 NOTE — Progress Notes (Signed)
 Refill letrozole  2.5mg .

## 2025-07-04 ENCOUNTER — Inpatient Hospital Stay: Admitting: Oncology
